# Patient Record
Sex: Male | Born: 1940 | Race: White | Hispanic: No | Marital: Married | Smoking: Former smoker
Health system: Southern US, Community
[De-identification: ages and names within clinical notes are randomized; demographics above are authoritative.]

## PROBLEM LIST (undated history)

## (undated) DIAGNOSIS — K611 Rectal abscess: Secondary | ICD-10-CM

## (undated) DIAGNOSIS — R011 Cardiac murmur, unspecified: Secondary | ICD-10-CM

## (undated) DIAGNOSIS — I4891 Unspecified atrial fibrillation: Secondary | ICD-10-CM

## (undated) DIAGNOSIS — K649 Unspecified hemorrhoids: Secondary | ICD-10-CM

## (undated) DIAGNOSIS — K602 Anal fissure, unspecified: Secondary | ICD-10-CM

## (undated) DIAGNOSIS — I10 Essential (primary) hypertension: Secondary | ICD-10-CM

## (undated) DIAGNOSIS — K635 Polyp of colon: Secondary | ICD-10-CM

## (undated) HISTORY — DX: Cardiac murmur, unspecified: R01.1

## (undated) HISTORY — PX: COLONOSCOPY: SHX174

## (undated) HISTORY — DX: Polyp of colon: K63.5

## (undated) HISTORY — DX: Anal fissure, unspecified: K60.2

## (undated) HISTORY — DX: Rectal abscess: K61.1

## (undated) HISTORY — DX: Essential (primary) hypertension: I10

## (undated) HISTORY — DX: Unspecified hemorrhoids: K64.9

## (undated) HISTORY — PX: RECTAL SURGERY: SHX760

## (undated) HISTORY — PX: FISSURECTOMY: SHX5244

---

## 2003-02-21 HISTORY — PX: KNEE SURGERY: SHX244

## 2005-02-20 HISTORY — PX: POLYPECTOMY: SHX149

## 2006-01-09 ENCOUNTER — Ambulatory Visit: Payer: Self-pay | Admitting: General Surgery

## 2009-09-06 ENCOUNTER — Emergency Department: Payer: Self-pay | Admitting: Emergency Medicine

## 2011-02-07 ENCOUNTER — Ambulatory Visit: Payer: Self-pay | Admitting: General Surgery

## 2011-07-06 ENCOUNTER — Ambulatory Visit: Payer: Self-pay | Admitting: General Surgery

## 2011-07-25 ENCOUNTER — Ambulatory Visit: Payer: Self-pay | Admitting: General Surgery

## 2011-10-22 HISTORY — PX: ANAL FISTULECTOMY: SHX1139

## 2011-10-24 ENCOUNTER — Ambulatory Visit: Payer: Self-pay | Admitting: General Surgery

## 2011-10-31 ENCOUNTER — Ambulatory Visit: Payer: Self-pay | Admitting: General Surgery

## 2012-08-13 ENCOUNTER — Ambulatory Visit (INDEPENDENT_AMBULATORY_CARE_PROVIDER_SITE_OTHER): Payer: Managed Care, Other (non HMO) | Admitting: General Surgery

## 2012-08-13 ENCOUNTER — Encounter: Payer: Self-pay | Admitting: General Surgery

## 2012-08-13 VITALS — BP 130/66 | HR 68 | Resp 14 | Ht 73.0 in | Wt 256.0 lb

## 2012-08-13 DIAGNOSIS — K6289 Other specified diseases of anus and rectum: Secondary | ICD-10-CM | POA: Insufficient documentation

## 2012-08-13 DIAGNOSIS — G8929 Other chronic pain: Secondary | ICD-10-CM

## 2012-08-13 LAB — POC HEMOCCULT BLD/STL (OFFICE/1-CARD/DIAGNOSTIC)

## 2012-08-13 NOTE — Progress Notes (Signed)
Patient ID: Erik Hendricks, male   DOB: 10-21-1940, 72 y.o.   MRN: [id number]  Chief Complaint  Patient presents with  . Rectal Problems    anal fistula    HPI Erik Hendricks is a 72 y.o. male.  Patient here today for rectal evaluation. 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 Wants to have area checked feels like he may have a " cyst" near retcum. Rectal pain for about 2 weeks. He indicated this was at least the size of a marble.  Using nitroglycerin and hydrocortisone from [facility].  Has put some neosporin on the "cyst area". No rectal bleeding. Known history of colon polyps but no colon cancer. No family history of colon cancer. HPI  No past medical history on file.  Past Surgical History  Procedure Laterality Date  . Knee surgery Left 2005  . Anal fistulectomy  2013  . Fissurectomy      40 yrs ago  . Polypectomy  2007  . Colonoscopy  2007, 2012    Dr Lemar Livings    Family History  Problem Relation Age of Onset  . Heart Problems Father     Social History History  Substance Use Topics  . Smoking status: Never Smoker   . Smokeless tobacco: Not on file  . Alcohol Use: No    Allergies not on file  No current outpatient prescriptions on file.   No current facility-administered medications for this visit.    Review of Systems Review of Systems  Constitutional: Negative.   Respiratory: Negative.   Cardiovascular: Negative.   Gastrointestinal: Positive for rectal pain. Negative for anal bleeding.    Height 6\' 1"  (1.854 m), weight 256 lb (116.121 kg).  Physical Exam Physical Exam  Constitutional: He is oriented to person, place, and time. He appears well-developed and well-nourished.  Cardiovascular: Normal rate, regular rhythm and normal heart sounds.   Pulmonary/Chest: Breath sounds normal.  Abdominal: Soft. Bowel sounds are normal. There is no hepatomegaly. There is no tenderness.  Genitourinary: Rectal exam shows internal hemorrhoid.  Ridge  of skin posterior  3 cm skin tag 2 cm from anus   Lymphadenopathy:    He has no cervical adenopathy.  Neurological: He is alert and oriented to person, place, and time.  Skin: Skin is warm and dry.    Data Reviewed Anoscopy showed some mildly prominent internal hemorrhoids without evidence of bleeding. The small amount of visualized rectal mucosa was unremarkable. No evidence of fissures.  External anal exam failed to show any discernible fistula tract. The original site treated in September 2013 is well-healed.    Assessment    No clear etiology for recurrent/persistent rectal pain status post posterior    Plan    The patient has been asked to discontinue both the hydrocortisone and nitroglycerin creams, and instead make use of nonmedicated Preparation H cream nightly. If he remains asymptomatic with this regimen, no further followup will be requested. If he did develop some recurrent pain and swelling as he described being present 2-3 weeks ago he'll be encouraged to call for early assessment while he is symptomatic       Dorathy Daft M 08/13/2012, 10:40 AM

## 2012-08-13 NOTE — Patient Instructions (Signed)
Patient to use Preparation H Cream daily

## 2012-08-27 ENCOUNTER — Ambulatory Visit: Payer: Self-pay | Admitting: General Surgery

## 2012-09-25 ENCOUNTER — Other Ambulatory Visit: Payer: Self-pay

## 2012-11-26 ENCOUNTER — Other Ambulatory Visit: Payer: Self-pay | Admitting: *Deleted

## 2012-11-26 MED ORDER — GABAPENTIN 600 MG PO TABS: 600.0000 mg | ORAL_TABLET | Freq: Three times a day (TID) | ORAL | Status: DC

## 2012-11-26 NOTE — Telephone Encounter (Signed)
Receive fax refill request , approved by dr Charlsie Merles

## 2012-12-26 ENCOUNTER — Other Ambulatory Visit: Payer: Self-pay

## 2012-12-31 ENCOUNTER — Telehealth: Payer: Self-pay | Admitting: *Deleted

## 2012-12-31 NOTE — Telephone Encounter (Signed)
Spoke with Erik Hendricks at pharmacy - [facility] told him only rx for neurontin filled should be for the 600mg  tid per dr Charlsie Merles. The neurontin 400mg  should not be filled per dr Charlsie Merles.

## 2013-01-07 ENCOUNTER — Telehealth: Payer: Self-pay | Admitting: *Deleted

## 2013-01-07 NOTE — Telephone Encounter (Signed)
meghan called from [facility] stating pt is requesting gabapentin 400mg . Per dr Charlsie Merles call in gabapentin 400mg  #30 with 5 refills. meghan stated she will fill and also put note in that he is to get this rx along with the 600mg  gabapentin.

## 2013-01-07 NOTE — Telephone Encounter (Signed)
Spoke with pt letting him know that gabapentin 400mg  had been called in to [facility].

## 2013-05-29 DIAGNOSIS — K529 Noninfective gastroenteritis and colitis, unspecified: Secondary | ICD-10-CM | POA: Insufficient documentation

## 2013-05-29 DIAGNOSIS — I10 Essential (primary) hypertension: Secondary | ICD-10-CM | POA: Insufficient documentation

## 2013-05-29 DIAGNOSIS — E785 Hyperlipidemia, unspecified: Secondary | ICD-10-CM | POA: Insufficient documentation

## 2013-05-29 DIAGNOSIS — H53121 Transient visual loss, right eye: Secondary | ICD-10-CM | POA: Insufficient documentation

## 2013-05-29 DIAGNOSIS — G459 Transient cerebral ischemic attack, unspecified: Secondary | ICD-10-CM | POA: Insufficient documentation

## 2013-05-29 DIAGNOSIS — I639 Cerebral infarction, unspecified: Secondary | ICD-10-CM | POA: Insufficient documentation

## 2013-05-29 DIAGNOSIS — I519 Heart disease, unspecified: Secondary | ICD-10-CM | POA: Insufficient documentation

## 2013-05-30 ENCOUNTER — Telehealth: Payer: Self-pay | Admitting: *Deleted

## 2013-05-30 NOTE — Telephone Encounter (Signed)
Pt needed the gabapentin 600mg  one tablet 3 times a day to be filled for a 90 day supply with 3 refills.  Qty 270 3 refills with [facility] [address]

## 2014-02-03 DIAGNOSIS — R0602 Shortness of breath: Secondary | ICD-10-CM | POA: Insufficient documentation

## 2014-03-27 ENCOUNTER — Encounter: Payer: Self-pay | Admitting: Podiatry

## 2014-03-27 ENCOUNTER — Ambulatory Visit (INDEPENDENT_AMBULATORY_CARE_PROVIDER_SITE_OTHER): Payer: Managed Care, Other (non HMO) | Admitting: Podiatry

## 2014-03-27 DIAGNOSIS — L03031 Cellulitis of right toe: Secondary | ICD-10-CM

## 2014-03-27 NOTE — Patient Instructions (Signed)

## 2014-03-29 NOTE — Progress Notes (Signed)
Subjective:     Patient ID: Erik Hendricks, male   DOB: Jan 02, 1941, 74 y.o.   MRN: [id number]  HPI patient states my right big toe on the lateral side has been painful with some drainage and also I still get a funny feeling in the bottom of both my feet and it doesn't seem the gabapentin is helping   Review of Systems  All other systems reviewed and are negative.      Objective:   Physical Exam  Constitutional: He is oriented to person, place, and time.  Cardiovascular: Intact distal pulses.   Musculoskeletal: Normal range of motion.  Neurological: He is oriented to person, place, and time.  Skin: Skin is warm and dry.  Nursing note and vitals reviewed.  neurovascular status has not changed with muscle strength adequate. Lateral border right hallux is incurvated with distal redness and drainage that is localized. 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 Today I infiltrated the right hallux 60 mg Xylocaine Marcaine mixture remove the lateral border removed proud flesh and distal abscess tissue and allowed channel for drainage. Applied sterile dressing and gave instructions on soaks and reappoint

## 2014-05-20 ENCOUNTER — Other Ambulatory Visit: Payer: Self-pay | Admitting: Podiatry

## 2014-06-09 NOTE — Op Note (Signed)
PATIENT NAME:  Erik Hendricks, Erik Hendricks MR#:  [id number] DATE OF BIRTH:  08/11/1940  DATE OF PROCEDURE:  10/31/2011  PREOPERATIVE DIAGNOSIS: Fistula in ano.   POSTOPERATIVE DIAGNOSIS: Fistula in ano.   OPERATIVE PROCEDURE: Anal fistulotomy.   SURGEON: Hervey Ard, MD   ANESTHESIA: General by LMA, Marcaine 0.5% with 1: 200,000 units of epinephrine, 30 mL local infiltration.   ESTIMATED BLOOD LOSS: Minimal.   CLINICAL NOTE: This 74 year old male had a perirectal abscess earlier in the year. He has developed a fistula in ano. He was admitted for elective repair. The patient received Invanz prior to the procedure.   OPERATIVE NOTE: With the patient under adequate general anesthesia he was placed in the dorsal lithotomy position and the perineum prepped with Betadine solution and draped. The fistula was identified. An anoscope was placed. There was a straight fistula tract from the six o'clock position to the dentate line. This involved the superficial sphincter only. A lacrimal duct probe was placed in the area opened with electrocautery. A curette was used to remove granulation tissue. Bleeding was controlled with electrocautery. A Vaseline gauze pack was placed for later removal in the recovery room. The patient tolerated the procedure well and was taken to the recovery room in stable condition.    ____________________________ Robert Bellow, MD jwb:bjt D:  10/31/2011 22:10:20 ET           T:  11/01/2011 08:53:18 ET         JOB#: [id number]  cc: Robert Bellow, MD, <Dictator> Irven Easterly. Kary Kos, MD Lollie Sails, MD Terrence Pizana Amedeo Kinsman MD ELECTRONICALLY SIGNED 11/02/2011 15:00

## 2014-08-05 DIAGNOSIS — Z01810 Encounter for preprocedural cardiovascular examination: Secondary | ICD-10-CM | POA: Insufficient documentation

## 2014-08-13 ENCOUNTER — Telehealth: Payer: Self-pay | Admitting: *Deleted

## 2014-08-13 MED ORDER — GABAPENTIN 400 MG PO CAPS: 400.0000 mg | ORAL_CAPSULE | Freq: Every day | ORAL | Status: DC

## 2014-08-13 MED ORDER — GABAPENTIN 400 MG PO CAPS: 600.0000 mg | ORAL_CAPSULE | Freq: Every day | ORAL | Status: DC

## 2014-08-13 NOTE — Telephone Encounter (Signed)
Saw dr Erik Hendricks few months ago , would like a increase in the gabapentin . Please advise

## 2014-08-13 NOTE — Telephone Encounter (Signed)
Pt currently taking Gabapentin 600mg  tid and would like to increase his dosage.

## 2014-08-13 NOTE — Telephone Encounter (Signed)
Called pt-per Dr. Paulla Dolly advised to take 400 mg with the last 600 mg dose at night and will refill the gabapentin 400 mg

## 2014-09-03 DIAGNOSIS — H269 Unspecified cataract: Secondary | ICD-10-CM | POA: Insufficient documentation

## 2014-10-19 ENCOUNTER — Telehealth: Payer: Self-pay | Admitting: *Deleted

## 2014-10-19 NOTE — Telephone Encounter (Signed)
Faxed request for 90 day supply of Gabapentin 400mg  2 tablets daily, Dr. Paulla Dolly okayed 90 day supply with +1 refill.  Faxed.

## 2015-10-18 ENCOUNTER — Encounter: Payer: Self-pay | Admitting: Urology

## 2015-10-18 ENCOUNTER — Ambulatory Visit (INDEPENDENT_AMBULATORY_CARE_PROVIDER_SITE_OTHER): Payer: Managed Care, Other (non HMO) | Admitting: Urology

## 2015-10-18 VITALS — BP 120/83 | HR 80 | Ht 73.0 in | Wt 260.3 lb

## 2015-10-18 DIAGNOSIS — I5189 Other ill-defined heart diseases: Secondary | ICD-10-CM | POA: Insufficient documentation

## 2015-10-18 DIAGNOSIS — K589 Irritable bowel syndrome without diarrhea: Secondary | ICD-10-CM | POA: Insufficient documentation

## 2015-10-18 DIAGNOSIS — R351 Nocturia: Secondary | ICD-10-CM

## 2015-10-18 DIAGNOSIS — I10 Essential (primary) hypertension: Secondary | ICD-10-CM | POA: Insufficient documentation

## 2015-10-18 DIAGNOSIS — I2510 Atherosclerotic heart disease of native coronary artery without angina pectoris: Secondary | ICD-10-CM | POA: Insufficient documentation

## 2015-10-18 DIAGNOSIS — I519 Heart disease, unspecified: Secondary | ICD-10-CM | POA: Insufficient documentation

## 2015-10-18 LAB — MICROSCOPIC EXAMINATION
Bacteria, UA: NONE SEEN
Epithelial Cells (non renal): NONE SEEN /hpf (ref 0–10)

## 2015-10-18 LAB — URINALYSIS, COMPLETE
Bilirubin, UA: NEGATIVE
GLUCOSE, UA: NEGATIVE
KETONES UA: NEGATIVE
LEUKOCYTES UA: NEGATIVE
Nitrite, UA: NEGATIVE
Protein, UA: NEGATIVE
RBC, UA: NEGATIVE
SPEC GRAV UA: 1.015 (ref 1.005–1.030)
Urobilinogen, Ur: 0.2 mg/dL (ref 0.2–1.0)
pH, UA: 5 (ref 5.0–7.5)

## 2015-10-18 LAB — BLADDER SCAN AMB NON-IMAGING: SCAN RESULT: 48

## 2015-10-18 NOTE — Progress Notes (Signed)
10/18/2015 11:47 AM   Erik Hendricks January 15, 1941 [id number]  Referring provider: Maryland Pink, MD [address] [facility] [place], [place] [address]  Chief Complaint  Patient presents with  . New Patient (Initial Visit)    Nocturia    HPI: 75 year old male who presents today with complaints of nocturia. The patient gets up on average 4x/nights.  This is been occurring for the last 3 years. However, the patient states that this is progressive over the last several years. The patient otherwise states that he has increased urinary frequency and mild urgency. He denies incomplete bladder emptying, intermittency, weak stream, or straining to void. The patient denies history of constipation. The patient has no history of recurrent urinary tract infections or urinary retention. He is not taking any medications for his symptoms currently.  The patient does say that he has severe obstructive sleep apnea/snores at night. His wife sleeps on the other into the house. He relates that he often wakes up and then realizes that he needs to void, and about half the time he awakens because he has to void. In other words, the patient does not sleep well. He denies any peripheral lower extremity edema. The patient states that he drinks several large glass of water at dinner which is at 5 PM. Following this, he has several sips of fluid but does not drink a significant amount with each step. He generally goes to bed around 11:30.   PMH: Past Medical History:  Diagnosis Date  . Anal fissure   . Colon polyp   . Hemorrhoid   . Hypertension   . Murmur   . Rectal abscess     Surgical History: Past Surgical History:  Procedure Laterality Date  . ANAL FISTULECTOMY  September 2013   Posterior subcutaneous fistula treated by fistulotomy.  . COLONOSCOPY  2007, 2012   Dr Bary Castilla  . FISSURECTOMY     40 yrs ago  . KNEE SURGERY Left 2005  . POLYPECTOMY  2007  . RECTAL SURGERY  40 yrs ago   cyst      Home Medications:    Medication List       Accurate as of 10/18/15 11:47 AM. Always use your most recent med list.          ASACOL HD 800 MG Tbec Generic drug:  Mesalamine Take 1 tablet by mouth daily.   aspirin 81 MG tablet Take 81 mg by mouth daily.   DIOVAN 80 MG tablet Generic drug:  valsartan 80 mg daily.   hydrocortisone 25 MG suppository Commonly known as:  ANUSOL-HC   Krill Oil 350 MG Caps Take by mouth.   metoprolol succinate 50 MG 24 hr tablet Commonly known as:  TOPROL-XL Take 1 tablet by mouth daily.   NIASPAN 500 MG CR tablet Generic drug:  niacin Take 1 tablet by mouth 3 (three) times daily.   pregabalin 50 MG capsule Commonly known as:  LYRICA Take 50 mg by mouth 3 (three) times daily.   RECTIV 0.4 % Oint Generic drug:  Nitroglycerin   simvastatin 20 MG tablet Commonly known as:  ZOCOR Take 1 tablet by mouth daily.       Allergies: No Known Allergies  Family History: Family History  Problem Relation Age of Onset  . Heart Problems Father   . Prostate cancer Neg Hx   . Bladder Cancer Neg Hx     Social History:  reports that he has never smoked. He has never used  smokeless tobacco. He reports that he does not drink alcohol or use drugs.  ROS: UROLOGY Frequent Urination?: Yes Hard to postpone urination?: Yes Burning/pain with urination?: No Get up at night to urinate?: Yes Leakage of urine?: No Urine stream starts and stops?: No Trouble starting stream?: No Do you have to strain to urinate?: No Blood in urine?: No Urinary tract infection?: No Sexually transmitted disease?: No Injury to kidneys or bladder?: No Painful intercourse?: No Weak stream?: No Erection problems?: No Penile pain?: No  Gastrointestinal Nausea?: No Vomiting?: No Indigestion/heartburn?: No Diarrhea?: No Constipation?: No  Constitutional Fever: No Night sweats?: No Weight loss?: No Fatigue?: No  Skin Skin rash/lesions?: Yes Itching?:  Yes  Eyes Blurred vision?: No Double vision?: No  Ears/Nose/Throat Sore throat?: No Sinus problems?: No  Hematologic/Lymphatic Swollen glands?: No Easy bruising?: No  Cardiovascular Leg swelling?: No Chest pain?: No  Respiratory Cough?: No Shortness of breath?: No  Endocrine Excessive thirst?: No  Musculoskeletal Back pain?: No Joint pain?: No  Neurological Headaches?: No Dizziness?: No  Psychologic Depression?: No Anxiety?: No  Physical Exam: BP 120/83 (BP Location: Left Arm, Patient Position: Sitting, Cuff Size: Large)   Pulse 80   Ht 6\' 1"  (1.854 m)   Wt 118.1 kg (260 lb 4.8 oz)   BMI 34.34 kg/m   Constitutional:  Alert and oriented, No acute distress. HEENT: [place] AT, moist mucus membranes.  Trachea midline, no masses. Cardiovascular: No clubbing, cyanosis, or edema. Respiratory: Normal respiratory effort, no increased work of breathing. GI: Abdomen is soft, nontender, nondistended, no abdominal masses GU: No CVA tenderness.  DRE: 40 g prostate, symmetric, no nodules Skin: No rashes, bruises or suspicious lesions. Lymph: No cervical or inguinal adenopathy. Neurologic: Grossly intact, no focal deficits, moving all 4 extremities. Psychiatric: Normal mood and affect.  Laboratory Data: No results found for: WBC, HGB, HCT, MCV, PLT  No results found for: CREATININE  No results found for: PSA  No results found for: TESTOSTERONE  No results found for: HGBA1C  Urinalysis No results found for: COLORURINE, APPEARANCEUR, LABSPEC, PHURINE, GLUCOSEU, HGBUR, BILIRUBINUR, KETONESUR, PROTEINUR, UROBILINOGEN, NITRITE, LEUKOCYTESUR  Pertinent Imaging: PVR: 46 mL's Patient's urinalysis today to adjacent evidence of infection or hematuria.  Assessment & Plan:  The patient has nocturia with mild overactive bladder symptoms. He does not appear to have an obstructing. The biggest driver in his nocturia is his obstructive sleep apnea.  1. Nocturia I recommended  the patient consider a sleep study so that he could be placed on a sleep apnea machine. The patient has adamantly refuses. We then discussed the mouth piece as well as the Breathe Right strips. The patient's most cinched and looking into the mouth piece, he will look into this on his own. I think a lot of his issues also that he is not sleeping well and waking up and then voiding. We discussed a sleep aid as well. Initially, I recommended that he take melatonin 5 mg daily at bedtime. In addition, given the patient samples of myrbetriq 50 mg daily. He will contact us if he would like a prescription for this. We'll plan to follow up with the patient in 6 months to see how he has progressed with his nocturia given the above interventions.  Meter tomorrow  PVR- Bladder Scan (Post Void Residual) in office - Urinalysis, Complete   Return in about 6 months (around 04/19/2016).  Ardis Hughs, [facility] [address] [place], [place] [address] [phone]  r;

## 2016-04-20 ENCOUNTER — Encounter: Payer: Self-pay | Admitting: Urology

## 2016-04-20 ENCOUNTER — Ambulatory Visit: Payer: 59 | Admitting: Urology

## 2016-04-20 VITALS — BP 159/74 | HR 73 | Ht 72.0 in | Wt 268.0 lb

## 2016-04-20 DIAGNOSIS — R351 Nocturia: Secondary | ICD-10-CM | POA: Diagnosis not present

## 2016-04-20 NOTE — Progress Notes (Signed)
04/20/2016 9:02 AM   Erik Hendricks 05-21-1940 [id number]  Referring provider: Maryland Pink, MD [address] [facility] [place], [place] [address]  Chief Complaint  Patient presents with  . Nocturia    71month follow up    HPI: 76 year old male who presents today with complaints of nocturia. The patient gets up on average 4x/nights.  This is been occurring for the last 3 years. However, the patient states that this is progressive over the last several years. The patient otherwise states that he has increased urinary frequency and mild urgency. He denies incomplete bladder emptying, intermittency, weak stream, or straining to void. The patient denies history of constipation. The patient has no history of recurrent urinary tract infections or urinary retention. He is not taking any medications for his symptoms currently.  The patient does say that he has severe obstructive sleep apnea/snores at night. His wife sleeps on the other into the house. He relates that he often wakes up and then realizes that he needs to void, and about half the time he awakens because he has to void. In other words, the patient does not sleep well. He denies any peripheral lower extremity edema. The patient states that he drinks several large glass of water at dinner which is at 5 PM. Following this, he has several sips of fluid but does not drink a significant amount with each step. He generally goes to bed around 11:30.    At his last visit, the patient was recommended to undergo a sleep study as this was thought to be the main driver of his nocturia. He was started on Myrbetriq 50 mg daily but is no longer taking it. She does not remember if it worked for him or not. He is not interested in undergoing a sleep study. He notes that this time if he takes a Benadryl he is able to sleep through most the night with nocturia 1. He does not take Benadryl he awakes 3 times to urinate. He is a poor historian this  time.   PMH: Past Medical History:  Diagnosis Date  . Anal fissure   . Colon polyp   . Hemorrhoid   . Hypertension   . Murmur   . Rectal abscess     Surgical History: Past Surgical History:  Procedure Laterality Date  . ANAL FISTULECTOMY  September 2013   Posterior subcutaneous fistula treated by fistulotomy.  . COLONOSCOPY  2007, 2012   Dr Bary Castilla  . FISSURECTOMY     40 yrs ago  . KNEE SURGERY Left 2005  . POLYPECTOMY  2007  . RECTAL SURGERY  40 yrs ago   cyst    Home Medications:  Allergies as of 04/20/2016   No Known Allergies     Medication List       Accurate as of 04/20/16  9:02 AM. Always use your most recent med list.          ASACOL HD 800 MG Tbec Generic drug:  Mesalamine Take 1 tablet by mouth daily.   aspirin 81 MG tablet Take 81 mg by mouth daily.   DIOVAN 80 MG tablet Generic drug:  valsartan 80 mg daily.   hydrocortisone 25 MG suppository Commonly known as:  ANUSOL-HC   Krill Oil 350 MG Caps Take by mouth.   metoprolol succinate 50 MG 24 hr tablet Commonly known as:  TOPROL-XL Take 1 tablet by mouth daily.   NIASPAN 500 MG CR tablet Generic drug:  niacin Take  1 tablet by mouth 3 (three) times daily.   pregabalin 50 MG capsule Commonly known as:  LYRICA Take 50 mg by mouth 3 (three) times daily.   RECTIV 0.4 % Oint Generic drug:  Nitroglycerin   simvastatin 20 MG tablet Commonly known as:  ZOCOR Take 1 tablet by mouth daily.       Allergies: No Known Allergies  Family History: Family History  Problem Relation Age of Onset  . Heart Problems Father   . Prostate cancer Neg Hx   . Bladder Cancer Neg Hx     Social History:  reports that he has quit smoking. His smoking use included Cigars. He has never used smokeless tobacco. He reports that he does not drink alcohol or use drugs.  ROS: UROLOGY Frequent Urination?: Yes Hard to postpone urination?: No Burning/pain with urination?: No Get up at night to urinate?:  Yes Leakage of urine?: No Urine stream starts and stops?: No Trouble starting stream?: No Do you have to strain to urinate?: No Blood in urine?: No Urinary tract infection?: No Sexually transmitted disease?: No Injury to kidneys or bladder?: No Painful intercourse?: No Weak stream?: No Erection problems?: No Penile pain?: No  Gastrointestinal Nausea?: No Vomiting?: No Indigestion/heartburn?: No Diarrhea?: No Constipation?: No  Constitutional Fever: No Night sweats?: No Weight loss?: No Fatigue?: No  Skin Skin rash/lesions?: No Itching?: No  Eyes Blurred vision?: No Double vision?: No  Ears/Nose/Throat Sore throat?: No Sinus problems?: No  Hematologic/Lymphatic Swollen glands?: No Easy bruising?: No  Cardiovascular Leg swelling?: No Chest pain?: No  Respiratory Cough?: No Shortness of breath?: No  Endocrine Excessive thirst?: No  Musculoskeletal Back pain?: No Joint pain?: No  Neurological Headaches?: No Dizziness?: No  Psychologic Depression?: No Anxiety?: No  Physical Exam: BP (!) 159/74   Pulse 73   Ht 6' (1.829 m)   Wt 268 lb (121.6 kg)   BMI 36.35 kg/m   Constitutional:  Alert and oriented, No acute distress. HEENT: [place] AT, moist mucus membranes.  Trachea midline, no masses. Cardiovascular: No clubbing, cyanosis, or edema. Respiratory: Normal respiratory effort, no increased work of breathing. GI: Abdomen is soft, nontender, nondistended, no abdominal masses GU: No CVA tenderness.  Skin: No rashes, bruises or suspicious lesions. Lymph: No cervical or inguinal adenopathy. Neurologic: Grossly intact, no focal deficits, moving all 4 extremities. Psychiatric: Normal mood and affect.  Laboratory Data: No results found for: WBC, HGB, HCT, MCV, PLT  No results found for: CREATININE  No results found for: PSA  No results found for: TESTOSTERONE  No results found for: HGBA1C  Urinalysis    Component Value Date/Time    APPEARANCEUR Clear 10/18/2015 1110   GLUCOSEU Negative 10/18/2015 1110   BILIRUBINUR Negative 10/18/2015 1110   PROTEINUR Negative 10/18/2015 1110   NITRITE Negative 10/18/2015 1110   LEUKOCYTESUR Negative 10/18/2015 1110     Assessment & Plan:    1. Nocturia The patient's nocturia is likely from undiagnosed obstructive sleep apnea. I again encouraged him to discuss undergoing a sleep study with his primary care provider. We discussed the health risks of obstructive sleep apnea as well as how relates nocturia. At this time though he does seem uninterested undergoing this test. Since he does take Benadryl and is able to sleep through the night with minimal nocturia, I recommended that he continue with this. He will follow-up with [place] as needed.  Return if symptoms worsen or fail to improve.  Nickie Retort, MD  [facility] [phone]  [address] [place], [place] [address] [phone]

## 2018-03-14 ENCOUNTER — Other Ambulatory Visit: Payer: Self-pay | Admitting: Student

## 2018-03-14 DIAGNOSIS — G9519 Other vascular myelopathies: Secondary | ICD-10-CM

## 2018-03-14 DIAGNOSIS — M48062 Spinal stenosis, lumbar region with neurogenic claudication: Secondary | ICD-10-CM

## 2018-03-26 ENCOUNTER — Other Ambulatory Visit: Payer: Self-pay | Admitting: Neurosurgery

## 2018-03-26 ENCOUNTER — Ambulatory Visit: Admission: RE | Admit: 2018-03-26 | Payer: 59 | Source: Ambulatory Visit

## 2018-03-26 DIAGNOSIS — R29898 Other symptoms and signs involving the musculoskeletal system: Secondary | ICD-10-CM

## 2018-03-27 ENCOUNTER — Ambulatory Visit
Admission: RE | Admit: 2018-03-27 | Discharge: 2018-03-27 | Disposition: A | Discharge status: Home / Self Care | Payer: 59 | Source: Ambulatory Visit | Attending: Neurosurgery | Admitting: Neurosurgery

## 2018-03-27 DIAGNOSIS — R29898 Other symptoms and signs involving the musculoskeletal system: Secondary | ICD-10-CM | POA: Diagnosis present

## 2018-04-18 ENCOUNTER — Ambulatory Visit (INDEPENDENT_AMBULATORY_CARE_PROVIDER_SITE_OTHER): Payer: 59

## 2018-04-18 ENCOUNTER — Ambulatory Visit (INDEPENDENT_AMBULATORY_CARE_PROVIDER_SITE_OTHER): Payer: 59 | Admitting: Podiatry

## 2018-04-18 ENCOUNTER — Encounter: Payer: Self-pay | Admitting: Podiatry

## 2018-04-18 VITALS — BP 150/90 | HR 74

## 2018-04-18 DIAGNOSIS — R200 Anesthesia of skin: Secondary | ICD-10-CM

## 2018-04-18 DIAGNOSIS — M205X1 Other deformities of toe(s) (acquired), right foot: Secondary | ICD-10-CM

## 2018-04-18 DIAGNOSIS — R609 Edema, unspecified: Secondary | ICD-10-CM | POA: Diagnosis not present

## 2018-04-18 MED ORDER — MELOXICAM 7.5 MG PO TABS: 7.5000 mg | ORAL_TABLET | Freq: Every day | ORAL | 1 refills | Status: DC

## 2018-04-18 NOTE — Progress Notes (Signed)
Patient presents the office with chief complaint of numbness and tingling and burning on the bottom of his right foot.  Patient states that the pain starts under the big toe joint and moves outward.  He says by the end of the day there is swelling in his right foot.  He says this is been going on for a long time and now his left foot is starting to have similar symptoms.  He has a history of back problems that he was treated by the neurosurgeon.  He also has been on gabapentin and Lyrica for neuropathy.  He also relates having been scheduled for a vascular study due to pain in her in his upper leg right foot.  He denies any history of trauma or injury to the foot.  He has provided no self treatment or sought any professional help for his symptomatic  right foot.  Vascular  Dorsalis pedis and posterior tibial pulses are palpable  B/L.  Capillary return  WNL.  Temperature gradient is  WNL.  Skin turgor  WNL  Sensorium  Senn Weinstein monofilament wire  WNL. Normal tactile sensation.  Nail Exam  Patient has normal nails with no evidence of bacterial or fungal infection.  Orthopedic  Exam  Muscle tone and muscle strength  WNL.  No limitations of motion feet  B/L.  No crepitus or joint effusion noted.  Foot type is unremarkable and digits show no abnormalities. Limitation of motion 1st MPJ right foot.  Plantarflexed first met right foot.    Skin  No open lesions.  Normal skin texture and turgor.  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    IE.  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  Bone reactivity noted at the dorsum of the first metatarsal right foot.  Narrowing of the joint space first MPJ right foot.  Discussed this condition with this patient.  Told him that the numbness can be related to the lack of motion during gait in the big toe joint right foot.  He is scheduled to have vascular studies performed tomorrow to rule out any vascular pathology.   Patient was told he would benefit from an evaluation by Dr. Milinda Pointer for possible removal of the bony block right big toe.  In the interim patient was prescribed Mobic 7.5 mg #30 with 1 refill.     Gardiner Barefoot DPM

## 2018-05-01 ENCOUNTER — Other Ambulatory Visit: Payer: Self-pay | Admitting: Unknown Physician Specialty

## 2018-05-01 DIAGNOSIS — D110 Benign neoplasm of parotid gland: Principal | ICD-10-CM

## 2018-05-01 DIAGNOSIS — K118 Other diseases of salivary glands: Secondary | ICD-10-CM

## 2018-05-06 ENCOUNTER — Encounter: Payer: Self-pay | Admitting: Podiatry

## 2018-05-06 ENCOUNTER — Other Ambulatory Visit: Payer: Self-pay

## 2018-05-06 ENCOUNTER — Ambulatory Visit (INDEPENDENT_AMBULATORY_CARE_PROVIDER_SITE_OTHER): Payer: 59 | Admitting: Podiatry

## 2018-05-06 DIAGNOSIS — M205X1 Other deformities of toe(s) (acquired), right foot: Secondary | ICD-10-CM

## 2018-05-06 NOTE — Progress Notes (Signed)
He presents today with a chief complaint of numbness in his bilateral feet.  He has been told by his neurosurgeon the most likely was associated with his spinal stenosis and he will be following up with a neurologist in the next few days for confirmation.  He states that as long as he takes his Lyrica he does really well otherwise his feet are painful.  He states that currently with Lyrica they are just numb but not painful.  Objective: Vital signs are stable he is alert oriented x3.  Pulses are palpable.  He has decreased sensorium per Semmes Weinstein monofilament forefoot bilateral.  DP reflexes are intact muscle strength is normal symmetrical.  No open lesions or wounds.  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  Plan: Continue Lyrica he is on follow-up with neurology who hopefully will perform a nerve conduction test and EMG and provide him with a better explanation as to why he hurts.  Should he be referred to pain management or referred back to [place] then more than likely we will send him to Dr. Clydell Hakim at [facility] neurosurgery.

## 2018-05-09 ENCOUNTER — Other Ambulatory Visit: Payer: Self-pay

## 2018-05-09 ENCOUNTER — Ambulatory Visit
Admission: RE | Admit: 2018-05-09 | Discharge: 2018-05-09 | Disposition: A | Discharge status: Home / Self Care | Payer: 59 | Source: Ambulatory Visit | Attending: Unknown Physician Specialty | Admitting: Unknown Physician Specialty

## 2018-05-09 DIAGNOSIS — K118 Other diseases of salivary glands: Secondary | ICD-10-CM

## 2018-05-09 DIAGNOSIS — D110 Benign neoplasm of parotid gland: Secondary | ICD-10-CM | POA: Insufficient documentation

## 2018-05-09 LAB — POCT I-STAT CREATININE: Creatinine, Ser: 0.8 mg/dL (ref 0.61–1.24)

## 2018-05-09 MED ORDER — IOHEXOL 300 MG/ML  SOLN
75.0000 mL | Freq: Once | INTRAMUSCULAR | Status: AC | PRN
  Administered 2018-05-09: 75 mL via INTRAVENOUS

## 2018-05-14 ENCOUNTER — Other Ambulatory Visit: Payer: Self-pay | Admitting: Unknown Physician Specialty

## 2018-05-14 DIAGNOSIS — K118 Other diseases of salivary glands: Secondary | ICD-10-CM

## 2018-05-15 ENCOUNTER — Other Ambulatory Visit: Payer: Self-pay | Admitting: Radiology

## 2018-05-16 ENCOUNTER — Other Ambulatory Visit: Payer: Self-pay

## 2018-05-16 ENCOUNTER — Ambulatory Visit
Admission: RE | Admit: 2018-05-16 | Discharge: 2018-05-16 | Disposition: A | Discharge status: Home / Self Care | Payer: 59 | Source: Ambulatory Visit | Attending: Unknown Physician Specialty | Admitting: Unknown Physician Specialty

## 2018-05-16 DIAGNOSIS — Z7982 Long term (current) use of aspirin: Secondary | ICD-10-CM | POA: Diagnosis not present

## 2018-05-16 DIAGNOSIS — I10 Essential (primary) hypertension: Secondary | ICD-10-CM | POA: Insufficient documentation

## 2018-05-16 DIAGNOSIS — Z7901 Long term (current) use of anticoagulants: Secondary | ICD-10-CM | POA: Diagnosis not present

## 2018-05-16 DIAGNOSIS — I4891 Unspecified atrial fibrillation: Secondary | ICD-10-CM | POA: Diagnosis not present

## 2018-05-16 DIAGNOSIS — Z79899 Other long term (current) drug therapy: Secondary | ICD-10-CM | POA: Diagnosis not present

## 2018-05-16 DIAGNOSIS — R011 Cardiac murmur, unspecified: Secondary | ICD-10-CM | POA: Diagnosis not present

## 2018-05-16 DIAGNOSIS — K118 Other diseases of salivary glands: Secondary | ICD-10-CM | POA: Insufficient documentation

## 2018-05-16 DIAGNOSIS — Z791 Long term (current) use of non-steroidal anti-inflammatories (NSAID): Secondary | ICD-10-CM | POA: Insufficient documentation

## 2018-05-16 DIAGNOSIS — Z87891 Personal history of nicotine dependence: Secondary | ICD-10-CM | POA: Diagnosis not present

## 2018-05-16 HISTORY — DX: Unspecified atrial fibrillation: I48.91

## 2018-05-16 LAB — CBC
HCT: 45.5 % (ref 39.0–52.0)
Hemoglobin: 14.9 g/dL (ref 13.0–17.0)
MCH: 29.9 pg (ref 26.0–34.0)
MCHC: 32.7 g/dL (ref 30.0–36.0)
MCV: 91.2 fL (ref 80.0–100.0)
PLATELETS: 173 10*3/uL (ref 150–400)
RBC: 4.99 MIL/uL (ref 4.22–5.81)
RDW: 13.1 % (ref 11.5–15.5)
WBC: 7.1 10*3/uL (ref 4.0–10.5)
nRBC: 0 % (ref 0.0–0.2)

## 2018-05-16 LAB — PROTIME-INR
INR: 1 (ref 0.8–1.2)
Prothrombin Time: 12.8 seconds (ref 11.4–15.2)

## 2018-05-16 MED ORDER — SODIUM CHLORIDE 0.9 % IV SOLN
INTRAVENOUS | Status: DC
  Administered 2018-05-16: 14:00:00 via INTRAVENOUS

## 2018-05-16 MED ORDER — MIDAZOLAM HCL 5 MG/5ML IJ SOLN
INTRAMUSCULAR | Status: AC
  Filled 2018-05-16: qty 5

## 2018-05-16 MED ORDER — FENTANYL CITRATE (PF) 100 MCG/2ML IJ SOLN
INTRAMUSCULAR | Status: AC
  Filled 2018-05-16: qty 4

## 2018-05-16 MED ORDER — FENTANYL CITRATE (PF) 100 MCG/2ML IJ SOLN
INTRAMUSCULAR | Status: AC | PRN
  Administered 2018-05-16: 50 ug via INTRAVENOUS
  Administered 2018-05-16 (×2): 25 ug via INTRAVENOUS

## 2018-05-16 MED ORDER — MIDAZOLAM HCL 5 MG/5ML IJ SOLN
INTRAMUSCULAR | Status: AC | PRN
  Administered 2018-05-16 (×2): 1 mg via INTRAVENOUS

## 2018-05-16 NOTE — Procedures (Signed)
L parotid mass core Bx 18 g times two EBL 0 Comp 0

## 2018-05-16 NOTE — H&P (Signed)
Chief Complaint: Patient was seen in consultation today for No chief complaint on file.  at the request of Neibert  Referring Physician(s): McQueen,Chapman  Supervising Physician: Marybelle Killings  Patient Status: [facility] - Out-pt  History of Present Illness: Erik Hendricks is a 78 y.o. male with a left parotid mass. Biopsy is requested. He denies fever, chills, SOB, cough.  Past Medical History:  Diagnosis Date  . A-fib ([place])   . Anal fissure   . Colon polyp   . Hemorrhoid   . Hypertension   . Murmur   . Rectal abscess     Past Surgical History:  Procedure Laterality Date  . ANAL FISTULECTOMY  September 2013   Posterior subcutaneous fistula treated by fistulotomy.  . COLONOSCOPY  2007, 2012   Dr Bary Castilla  . FISSURECTOMY     40 yrs ago  . KNEE SURGERY Left 2005  . POLYPECTOMY  2007  . RECTAL SURGERY  40 yrs ago   cyst    Allergies: Ciprofloxacin  Medications: Prior to Admission medications   Medication Sig Start Date End Date Taking? Authorizing Provider  aspirin EC 81 MG tablet Take by mouth daily.    Yes [provider]  balsalazide (COLAZAL) 750 MG capsule Take 750 mg by mouth 2 (two) times daily.   Yes [provider]  fluticasone (FLONASE) 50 MCG/ACT nasal spray Place 1 spray into both nostrils daily.   Yes [provider]  Javier Docker Oil 350 MG CAPS Take 350 mg by mouth 2 (two) times daily.    Yes [provider]  losartan (COZAAR) 50 MG tablet TAKE 1 TABLET BY MOUTH EVERY DAY 03/21/18  Yes [provider]  Mesalamine 800 MG TBEC Take 800 mg by mouth 3 (three) times daily.  11/13/16  Yes [provider]  metoprolol succinate (TOPROL-XL) 50 MG 24 hr tablet Take 50 mg by mouth 2 (two) times daily.    Yes [provider]  niacin (NIASPAN) 500 MG CR tablet Take 500 mg by mouth daily.    Yes [provider]  pregabalin (LYRICA) 150 MG capsule Take 150 mg by mouth 2 (two) times daily. 04/18/18   Yes [provider]  pregabalin (LYRICA) 50 MG capsule Take 150 mg by mouth 2 (two) times daily.    Yes [provider]  simvastatin (ZOCOR) 20 MG tablet Take 20 mg by mouth daily.  02/01/18  Yes [provider]  diclofenac sodium (VOLTAREN) 1 % GEL Apply topically 4 (four) times daily.  02/05/18 02/05/19  [provider]  DULoxetine (CYMBALTA) 30 MG capsule Take by mouth daily. 04/24/18   [provider]  gabapentin (NEURONTIN) 400 MG capsule gabapentin 400 mg capsule    [provider]  meloxicam (MOBIC) 7.5 MG tablet Take 1 tablet (7.5 mg total) by mouth daily. Patient not taking: Reported on 05/16/2018 04/18/18   Gardiner Barefoot, DPM  metoprolol tartrate (LOPRESSOR) 50 MG tablet  05/02/18   [provider]  montelukast (SINGULAIR) 10 MG tablet Take by mouth. 04/03/17   [provider]  triamterene-hydrochlorothiazide (MAXZIDE-25) 37.5-25 MG tablet Take 1 tablet by mouth daily. 04/21/18   [provider]  valsartan (DIOVAN) 80 MG tablet valsartan 80 mg tablet    [provider]     Family History  Problem Relation Age of Onset  . Heart Problems Father   . Prostate cancer Neg Hx   . Bladder Cancer Neg Hx     Social History  Socioeconomic History  . Marital status: Married    Spouse name: Not on file  . Number of children: Not on file  . Years of education: Not on file  . Highest education level: Not on file  Occupational History  . Not on file  Social Needs  . Financial resource strain: Not on file  . Food insecurity:    Worry: Not on file    Inability: Not on file  . Transportation needs:    Medical: Not on file    Non-medical: Not on file  Tobacco Use  . Smoking status: Former Smoker    Types: Cigars  . Smokeless tobacco: Never Used  Substance and Sexual Activity  . Alcohol use: No  . Drug use: No  . Sexual activity: Not on file  Lifestyle  . Physical activity:    Days per week: Not  on file    Minutes per session: Not on file  . Stress: Not on file  Relationships  . Social connections:    Talks on phone: Not on file    Gets together: Not on file    Attends religious service: Not on file    Active member of club or organization: Not on file    Attends meetings of clubs or organizations: Not on file    Relationship status: Not on file  Other Topics Concern  . Not on file  Social History Narrative  . Not on file     Review of Systems: A 12 point ROS discussed and pertinent positives are indicated in the HPI above.  All other systems are negative.  Review of Systems  Vital Signs: BP (!) 143/80   Pulse 70   Temp 97.8 F (36.6 C) (Oral)   Resp 15   Ht 6' (1.829 m)   Wt 116.6 kg   SpO2 96%   BMI 34.86 kg/m   Physical Exam Constitutional:      Appearance: Normal appearance.  HENT:     Head: Normocephalic and atraumatic.  Cardiovascular:     Rate and Rhythm: Normal rate and regular rhythm.  Pulmonary:     Effort: Pulmonary effort is normal.     Breath sounds: Normal breath sounds.  Abdominal:     General: Abdomen is flat.     Palpations: Abdomen is soft.  Skin:    General: Skin is warm and dry.  Neurological:     General: No focal deficit present.     Mental Status: He is alert and oriented to person, place, and time.     Imaging: Ct Soft Tissue Neck W Contrast  Result Date: 05/09/2018 CLINICAL DATA:  Benign mass  parotid gland. EXAM: CT NECK WITH CONTRAST TECHNIQUE: Multidetector CT imaging of the neck was performed using the standard protocol following the bolus administration of intravenous contrast. CONTRAST:  27mL OMNIPAQUE IOHEXOL 300 MG/ML  SOLN COMPARISON:  None. FINDINGS: Pharynx and larynx: Normal. No mass or swelling. Salivary glands: Left parotid mass measuring 16 x 14 mm. Soft tissue density with punctate calcification. Margins of the mass are ill-defined and infiltrate the parotid gland. Mass extends into the subcutaneous tissues  outside of the parotid gland. Right parotid gland normal aside from a small 4 mm lymph node anteriorly. Submandibular glands normal bilaterally without mass or stone Thyroid: 7 mm nodule right lower pole thyroid.  Otherwise negative. Lymph nodes: No pathologic or enlarged lymph nodes. 4 mm lymph nodes are seen on the surface of the parotid gland bilaterally. Vascular: Normal vascular  enhancement. Limited intracranial: Negative Visualized orbits: Negative for mass.  Bilateral cataract surgery. Mastoids and visualized paranasal sinuses: Mild mucosal edema paranasal sinuses. No air-fluid levels. Skeleton: Degenerative change throughout the cervical spine. No acute skeletal abnormality. Upper chest: Negative Other: None IMPRESSION: 16 x 14 mm left parotid mass. The mass has some aggressive features including irregular infiltration of the adjacent parotid gland as well as infiltration of the subcutaneous tissues. 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 No malignant adenopathy in the neck. Electronically Signed   By: Franchot Gallo M.D.   On: 05/09/2018 15:47   Dg Foot Complete Right  Result Date: 04/18/2018 Please see detailed radiograph report in office note.   Labs:  CBC: Recent Labs    05/16/18 1352  WBC 7.1  HGB 14.9  HCT 45.5  PLT 173    COAGS: Recent Labs    05/16/18 1352  INR 1.0    BMP: Recent Labs    05/09/18 1512  CREATININE 0.80    LIVER FUNCTION TESTS: No results for input(s): BILITOT, AST, ALT, ALKPHOS, PROT, ALBUMIN in the last 8760 hours.  TUMOR MARKERS: No results for input(s): AFPTM, CEA, CA199, CHROMGRNA in the last 8760 hours.  Assessment and Plan:  Left parotid mass. US guided biopsy to follow.  Thank you for this interesting consult.  I greatly enjoyed meeting Erik Hendricks and look forward to participating in their care.  A copy of this report was sent to the requesting provider on this date.  Electronically Signed:  Art A Jazir Newey, MD 05/16/2018, 2:27 PM   I spent a total of  40 Minutes   in face to face in clinical consultation, greater than 50% of which was counseling/coordinating care for left parotid biopsy.

## 2018-05-21 ENCOUNTER — Other Ambulatory Visit: Payer: Self-pay | Admitting: Pathology

## 2018-05-21 LAB — SURGICAL PATHOLOGY

## 2018-05-27 ENCOUNTER — Other Ambulatory Visit: Payer: Self-pay | Admitting: Unknown Physician Specialty

## 2018-05-27 ENCOUNTER — Other Ambulatory Visit (HOSPITAL_COMMUNITY): Payer: Self-pay | Admitting: Unknown Physician Specialty

## 2018-05-27 DIAGNOSIS — C07 Malignant neoplasm of parotid gland: Secondary | ICD-10-CM

## 2018-05-30 ENCOUNTER — Other Ambulatory Visit: Payer: Medicare Other

## 2018-05-30 NOTE — Progress Notes (Signed)
Tumor Board Documentation  Erik Hendricks was presented by Radiology at our Tumor Board on 05/30/2018, which included representatives from medical oncology, radiation oncology, surgical oncology, surgical, radiology, pathology, navigation, internal medicine, genetics, research, palliative care.  Erik Hendricks currently presents for [facility], for discussion, for new positive pathology with history of the following treatments: surgical intervention(s).  Additionally, we reviewed previous medical and familial history, history of present illness, and recent lab results along with all available histopathologic and imaging studies. The tumor board considered available treatment options and made the following recommendations: Surgery Surgical resection and Possible Radiation therapy  The following procedures/referrals were also placed: No orders of the defined types were placed in this encounter.   Clinical Trial Status: not discussed   Staging used: AJCC Stage Group  AJCC Staging: T: T4a N: N0   Group: Stage 4 A  National site-specific guidelines NCCN were discussed with respect to the case.  Tumor board is a meeting of clinicians from various specialty areas who evaluate and discuss patients for whom a multidisciplinary approach is being considered. Final determinations in the plan of care are those of the provider(s). The responsibility for follow up of recommendations given during tumor board is that of the provider.   Today's extended care, comprehensive team conference, Erik Hendricks was not present for the discussion and was not examined.   Multidisciplinary Tumor Board is a multidisciplinary case peer review process.  Decisions discussed in the Multidisciplinary Tumor Board reflect the opinions of the specialists present at the conference without having examined the patient.  Ultimately, treatment and diagnostic decisions rest with the primary provider(s) and the patient.

## 2018-06-04 ENCOUNTER — Other Ambulatory Visit: Payer: Self-pay

## 2018-06-04 ENCOUNTER — Ambulatory Visit
Admission: RE | Admit: 2018-06-04 | Discharge: 2018-06-04 | Disposition: A | Discharge status: Home / Self Care | Payer: 59 | Source: Ambulatory Visit | Attending: Unknown Physician Specialty | Admitting: Unknown Physician Specialty

## 2018-06-04 DIAGNOSIS — N400 Enlarged prostate without lower urinary tract symptoms: Secondary | ICD-10-CM | POA: Diagnosis not present

## 2018-06-04 DIAGNOSIS — I700 Atherosclerosis of aorta: Secondary | ICD-10-CM | POA: Diagnosis not present

## 2018-06-04 DIAGNOSIS — C07 Malignant neoplasm of parotid gland: Secondary | ICD-10-CM | POA: Diagnosis not present

## 2018-06-04 LAB — GLUCOSE, CAPILLARY: Glucose-Capillary: 134 mg/dL — ABNORMAL HIGH (ref 70–99)

## 2018-06-04 MED ORDER — FLUDEOXYGLUCOSE F - 18 (FDG) INJECTION
13.6500 | Freq: Once | INTRAVENOUS | Status: AC | PRN
  Administered 2018-06-04: 13.65 via INTRAVENOUS

## 2018-06-06 DIAGNOSIS — C089 Malignant neoplasm of major salivary gland, unspecified: Secondary | ICD-10-CM | POA: Diagnosis present

## 2018-06-12 ENCOUNTER — Ambulatory Visit: Payer: Medicare Other

## 2018-06-20 ENCOUNTER — Ambulatory Visit: Payer: Medicare Other

## 2018-06-20 ENCOUNTER — Encounter: Payer: Self-pay | Admitting: Anesthesiology

## 2018-06-21 ENCOUNTER — Other Ambulatory Visit: Payer: Self-pay

## 2018-06-21 ENCOUNTER — Ambulatory Visit
Admission: RE | Admit: 2018-06-21 | Discharge: 2018-06-21 | Disposition: A | Discharge status: Home / Self Care | Payer: 59 | Attending: Gastroenterology | Admitting: Gastroenterology

## 2018-06-21 ENCOUNTER — Ambulatory Visit: Payer: 59 | Admitting: Anesthesiology

## 2018-06-21 ENCOUNTER — Encounter
Admission: RE | Disposition: A | Discharge status: Home / Self Care | Payer: Self-pay | Source: Home / Self Care | Attending: Gastroenterology

## 2018-06-21 DIAGNOSIS — K5730 Diverticulosis of large intestine without perforation or abscess without bleeding: Secondary | ICD-10-CM | POA: Diagnosis not present

## 2018-06-21 DIAGNOSIS — Z791 Long term (current) use of non-steroidal anti-inflammatories (NSAID): Secondary | ICD-10-CM | POA: Insufficient documentation

## 2018-06-21 DIAGNOSIS — R948 Abnormal results of function studies of other organs and systems: Secondary | ICD-10-CM | POA: Diagnosis not present

## 2018-06-21 DIAGNOSIS — I10 Essential (primary) hypertension: Secondary | ICD-10-CM | POA: Diagnosis not present

## 2018-06-21 DIAGNOSIS — I4891 Unspecified atrial fibrillation: Secondary | ICD-10-CM | POA: Diagnosis not present

## 2018-06-21 DIAGNOSIS — Z7982 Long term (current) use of aspirin: Secondary | ICD-10-CM | POA: Diagnosis not present

## 2018-06-21 DIAGNOSIS — K635 Polyp of colon: Secondary | ICD-10-CM | POA: Diagnosis not present

## 2018-06-21 DIAGNOSIS — Z79899 Other long term (current) drug therapy: Secondary | ICD-10-CM | POA: Diagnosis not present

## 2018-06-21 DIAGNOSIS — Z881 Allergy status to other antibiotic agents status: Secondary | ICD-10-CM | POA: Diagnosis not present

## 2018-06-21 DIAGNOSIS — I2510 Atherosclerotic heart disease of native coronary artery without angina pectoris: Secondary | ICD-10-CM | POA: Diagnosis not present

## 2018-06-21 DIAGNOSIS — K56699 Other intestinal obstruction unspecified as to partial versus complete obstruction: Secondary | ICD-10-CM | POA: Insufficient documentation

## 2018-06-21 DIAGNOSIS — Z87891 Personal history of nicotine dependence: Secondary | ICD-10-CM | POA: Insufficient documentation

## 2018-06-21 HISTORY — PX: COLONOSCOPY WITH PROPOFOL: SHX5780

## 2018-06-21 SURGERY — COLONOSCOPY WITH PROPOFOL
Anesthesia: General

## 2018-06-21 MED ORDER — PROPOFOL 10 MG/ML IV BOLUS
INTRAVENOUS | Status: AC
  Filled 2018-06-21: qty 20

## 2018-06-21 MED ORDER — PROPOFOL 500 MG/50ML IV EMUL
INTRAVENOUS | Status: AC
  Filled 2018-06-21: qty 50

## 2018-06-21 MED ORDER — PROPOFOL 500 MG/50ML IV EMUL
INTRAVENOUS | Status: DC | PRN
  Administered 2018-06-21: 150 ug/kg/min via INTRAVENOUS

## 2018-06-21 MED ORDER — PROPOFOL 10 MG/ML IV BOLUS
INTRAVENOUS | Status: DC | PRN
  Administered 2018-06-21: 60 mg via INTRAVENOUS

## 2018-06-21 MED ORDER — SODIUM CHLORIDE 0.9 % IV SOLN
INTRAVENOUS | Status: DC
  Administered 2018-06-21: 1000 mL via INTRAVENOUS

## 2018-06-21 NOTE — Anesthesia Post-op Follow-up Note (Signed)
Anesthesia QCDR form completed.        

## 2018-06-21 NOTE — Transfer of Care (Signed)
Immediate Anesthesia Transfer of Care Note  Patient: Erik Hendricks  Procedure(s) Performed: COLONOSCOPY WITH PROPOFOL (N/A )  Patient Location: PACU  Anesthesia Type:General  Level of Consciousness: sedated  Airway & Oxygen Therapy: Patient Spontanous Breathing and Patient connected to face mask oxygen  Post-op Assessment: Report given to RN and Post -op Vital signs reviewed and stable  Post vital signs: Reviewed and stable  Last Vitals:  Vitals Value Taken Time  BP 111/60 06/21/2018 11:04 AM  Temp 36.4 C 06/21/2018 11:04 AM  Pulse 73 06/21/2018 11:04 AM  Resp 11 06/21/2018 11:04 AM  SpO2 96 % 06/21/2018 11:04 AM    Last Pain:  Vitals:   06/21/18 1104  TempSrc: Tympanic  PainSc: Asleep         Complications: No apparent anesthesia complications

## 2018-06-21 NOTE — Op Note (Signed)
[facility]astro[facility]ology Patient Name: Erik Hendricks Procedure Date: 06/21/2018 9:39 AM MRN: [id number] Account #: [id number] Date of Birth: 07/05/1940 Admit Type: Outpatient Age: 78 Room: [facility] ENDO ROOM 4 Gender: Male Note Status: Finalized Procedure:            Colonoscopy Indications:          Abnormal PET scan of the GI tract Providers:            Lollie Sails, MD Referring MD:         Irven Easterly. Kary Kos, MD (Referring MD) Medicines:            Monitored Anesthesia Care Complications:        No immediate complications. Procedure:            Pre-Anesthesia Assessment:                       - ASA Grade Assessment: III - A patient with severe                        systemic disease.                       After obtaining informed consent, the colonoscope was                        passed under direct vision. Throughout the procedure,                        the patient's blood pressure, pulse, and oxygen                        saturations were monitored continuously. The                        Colonoscope was introduced through the anus with the                        intention of advancing to the cecum. The scope was                        advanced to the sigmoid colon before the procedure was                        aborted. Medications were given. The colonoscopy was                        performed without difficulty. The patient tolerated the                        procedure well. The quality of the bowel preparation                        was good. Findings:      A 4 mm polyp was found at 30 cm proximal to the anus. The polyp was       sessile. The polyp was removed with a cold snare. Resection and       retrieval were complete.      A apparent stenosis was found at 35 cm proximal to the anus and was       non-traversed. The  lumen at this point became very narrow and there was       no apparent pathway noted despite thorough evaluation. There is no       evidence of inflamation. There was no evidence of surgical anastomosis       or other surgery.      Multiple small-mouthed diverticula were found in the sigmoid colon, mid       sigmoid colon and distal sigmoid colon.      The digital rectal exam was normal.      Biopsies for histology were taken with a cold forceps from the sigmoid       colon and rectum for evaluation of microscopic colitis. Impression:           - One 4 mm polyp at 30 cm proximal to the anus, removed                        with a cold snare. Resected and retrieved.                       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                       - Diverticulosis in the sigmoid colon, in the mid                        sigmoid colon and in the distal sigmoid colon. Recommendation:       - Discharge patient to home [Means].                       - Perform a virtual colonoscopy at appointment to be                        scheduled. Procedure Code(s):    --- Professional ---                       [phone], 52, Colonoscopy, flexible; with removal of                        tumor(s), polyp(s), or other lesion(s) by snare                        technique                       45380, 59,52, Colonoscopy, flexible; with biopsy,                        single or multiple Diagnosis Code(s):    --- Professional ---                       K63.5, Polyp of colon                       K56.699, Other intestinal obstruction unspecified as to                        partial versus complete obstruction                       K57.30, Diverticulosis of large intestine without  perforation or abscess without bleeding                       R93.3, Abnormal findings on diagnostic imaging of other                        parts of digestive tract CPT copyright 2019 American Medical Association. All rights reserved. The codes documented in this report are preliminary and upon coder review may  be revised to meet current  compliance requirements. Lollie Sails, MD 06/21/2018 11:05:43 AM This report has been signed electronically. Number of Addenda: 0 Note Initiated On: 06/21/2018 9:39 AM Total Procedure Duration: 0 hours 40 minutes 41 seconds       [facility]

## 2018-06-21 NOTE — H&P (Signed)
Outpatient short stay form Pre-procedure 06/21/2018 9:49 AM Erik Hendricks  Primary Physician: Dr. [place] Hendricks  Reason for visit: Colonoscopy  History of present illness: Patient is a 78 year old male presenting today for colonoscopy in regards to an abnormal PET scan of the sigmoid colon area.  He has a history of ulcerative colitis and has been on balsalazide for at least 8 years.  Last colonoscopy was 2012.  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  He has not had a colonoscopy since 2012.  That colonoscopy had he was noted to have extensive inflammatory changes beginning in the sigmoid near where a possible stricture was present and multiple ulcerated areas in the sigmoid descending transverse hepatic area and ascending colon.  Patient denies any abdominal pain rectal bleeding mucus in the stools.  States his stools are normal in caliber and daily in occurrence.  Previous colonoscopy reports from 2007 and 2012 the pathology report from 2012 were reviewed.    Current Facility-Administered Medications:  .  0.9 %  sodium chloride infusion, , Intravenous, Continuous, Erik Sails, Hendricks, Last Rate: 20 mL/hr at 06/21/18 0818, 1,000 mL at 06/21/18 0818  Medications Prior to Admission  Medication Sig Dispense Refill Last Dose  . aspirin EC 81 MG tablet Take by mouth daily.    Past Week at Unknown time  . balsalazide (COLAZAL) 750 MG capsule Take 750 mg by mouth 2 (two) times daily.   Past Week at Unknown time  . diclofenac sodium (VOLTAREN) 1 % GEL Apply topically 4 (four) times daily.    Past Week at Unknown time  . DULoxetine (CYMBALTA) 30 MG capsule Take by mouth daily.   Past Week at Unknown time  . fluticasone (FLONASE) 50 MCG/ACT nasal spray Place 1 spray into both nostrils daily.   Past Week at Unknown time  . gabapentin (NEURONTIN) 400 MG capsule gabapentin 400 mg capsule   Past Week at Unknown time   . Krill Oil 350 MG CAPS Take 350 mg by mouth 2 (two) times daily.    Past Week at Unknown time  . losartan (COZAAR) 50 MG tablet TAKE 1 TABLET BY MOUTH EVERY DAY   06/20/2018 at Unknown time  . meloxicam (MOBIC) 7.5 MG tablet Take 1 tablet (7.5 mg total) by mouth daily. 30 tablet 1 06/20/2018 at Unknown time  . Mesalamine 800 MG TBEC Take 800 mg by mouth 3 (three) times daily.    Past Week at Unknown time  . metoprolol succinate (TOPROL-XL) 50 MG 24 hr tablet Take 50 mg by mouth 2 (two) times daily.    06/20/2018 at Unknown time  . metoprolol tartrate (LOPRESSOR) 50 MG tablet    06/20/2018 at Unknown time  . montelukast (SINGULAIR) 10 MG tablet Take by mouth.   06/20/2018 at Unknown time  . niacin (NIASPAN) 500 MG CR tablet Take 500 mg by mouth daily.    06/20/2018 at Unknown time  . pregabalin (LYRICA) 150 MG capsule Take 150 mg by mouth 2 (two) times daily.   06/20/2018 at Unknown time  . pregabalin (LYRICA) 50 MG capsule Take 150 mg by mouth 2 (two) times daily.    06/20/2018 at Unknown time  . simvastatin (ZOCOR) 20 MG tablet Take 20 mg by mouth daily.    06/20/2018 at Unknown time  . triamterene-hydrochlorothiazide (MAXZIDE-25) 37.5-25 MG tablet Take 1 tablet by mouth daily.   06/20/2018 at Unknown  time  . valsartan (DIOVAN) 80 MG tablet valsartan 80 mg tablet   06/20/2018 at Unknown time     Allergies  Allergen Reactions  . Ciprofloxacin Nausea Only     Past Medical History:  Diagnosis Date  . A-fib ([place])   . Anal fissure   . Colon polyp   . Hemorrhoid   . Hypertension   . Murmur   . Rectal abscess     Review of systems:      Physical Exam    Heart and lungs: Regular rate and rhythm without rub or gallop, lungs are bilaterally clear.    HEENT: Normocephalic atraumatic eyes are anicteric    Other:    Pertinant exam for procedure: Soft nontender nondistended bowel sounds positive normoactive.  Of note patient has a scar in the right lower quadrant consistent with previous  inguinal herniorrhaphy.  He states he gets no swellings or protuberance in that area.  Did not see any other scars on the surface of the abdomen.    Planned proceedures: Colonoscopy and indicated procedures. I have discussed the risks benefits and complications of procedures to include not limited to bleeding, infection, perforation and the risk of sedation and the patient wishes to proceed.    Erik Sails, [facility] 06/21/2018  9:49 AM

## 2018-06-21 NOTE — Anesthesia Procedure Notes (Signed)
Date/Time: 06/21/2018 10:30 AM Performed by: Allean Found, CRNA Pre-anesthesia Checklist: Patient identified, Emergency Drugs available, Suction available, Patient being monitored and Timeout performed Oxygen Delivery Method: Nasal cannula Placement Confirmation: positive ETCO2

## 2018-06-21 NOTE — Anesthesia Preprocedure Evaluation (Signed)
Anesthesia Evaluation  Patient identified by MRN, date of birth, ID band Patient awake    Reviewed: Allergy & Precautions, NPO status , Patient's Chart, lab work & pertinent test results, reviewed documented beta blocker date and time   Airway Mallampati: III  TM Distance: >3 FB     Dental  (+) Chipped   Pulmonary former smoker,           Cardiovascular hypertension, Pt. on medications and Pt. on home beta blockers + CAD  + dysrhythmias Atrial Fibrillation + Valvular Problems/Murmurs      Neuro/Psych    GI/Hepatic   Endo/Other    Renal/GU      Musculoskeletal   Abdominal   Peds  Hematology   Anesthesia Other Findings Denies CVA or TIA. Has had PVCs for a long time.  Reproductive/Obstetrics                             Anesthesia Physical Anesthesia Plan  ASA: III  Anesthesia Plan: General   Post-op Pain Management:    Induction: Intravenous  PONV Risk Score and Plan:   Airway Management Planned:   Additional Equipment:   Intra-op Plan:   Post-operative Plan:   Informed Consent: I have reviewed the patients History and Physical, chart, labs and discussed the procedure including the risks, benefits and alternatives for the proposed anesthesia with the patient or authorized representative who has indicated his/her understanding and acceptance.       Plan Discussed with: CRNA  Anesthesia Plan Comments:         Anesthesia Quick Evaluation

## 2018-06-21 NOTE — Anesthesia Postprocedure Evaluation (Signed)
Anesthesia Post Note  Patient: Erik Hendricks  Procedure(s) Performed: COLONOSCOPY WITH PROPOFOL (N/A )  Patient location during evaluation: Endoscopy Anesthesia Type: General Level of consciousness: awake and alert Pain management: pain level controlled Vital Signs Assessment: post-procedure vital signs reviewed and stable Respiratory status: spontaneous breathing, nonlabored ventilation, respiratory function stable and patient connected to nasal cannula oxygen Cardiovascular status: blood pressure returned to baseline and stable Postop Assessment: no apparent nausea or vomiting Anesthetic complications: no     Last Vitals:  Vitals:   06/21/18 1124 06/21/18 1134  BP: 118/68 130/82  Pulse: 61 (!) 58  Resp: 16 15  Temp:    SpO2: 97% 97%    Last Pain:  Vitals:   06/21/18 1134  TempSrc:   PainSc: 0-No pain                 Traeton Bordas S

## 2018-06-24 ENCOUNTER — Other Ambulatory Visit: Payer: Self-pay | Admitting: Gastroenterology

## 2018-06-24 DIAGNOSIS — R948 Abnormal results of function studies of other organs and systems: Secondary | ICD-10-CM

## 2018-06-24 DIAGNOSIS — K56600 Partial intestinal obstruction, unspecified as to cause: Secondary | ICD-10-CM

## 2018-06-24 LAB — SURGICAL PATHOLOGY

## 2018-06-25 ENCOUNTER — Encounter: Payer: Self-pay | Admitting: Gastroenterology

## 2018-06-26 ENCOUNTER — Ambulatory Visit: Payer: Medicare Other

## 2018-08-19 DIAGNOSIS — I4811 Longstanding persistent atrial fibrillation: Secondary | ICD-10-CM | POA: Insufficient documentation

## 2019-02-11 DIAGNOSIS — K5100 Ulcerative (chronic) pancolitis without complications: Secondary | ICD-10-CM | POA: Diagnosis present

## 2019-02-28 ENCOUNTER — Other Ambulatory Visit: Payer: Self-pay | Admitting: Student

## 2019-02-28 DIAGNOSIS — R933 Abnormal findings on diagnostic imaging of other parts of digestive tract: Secondary | ICD-10-CM

## 2019-02-28 DIAGNOSIS — R948 Abnormal results of function studies of other organs and systems: Secondary | ICD-10-CM

## 2019-02-28 DIAGNOSIS — K56600 Partial intestinal obstruction, unspecified as to cause: Secondary | ICD-10-CM

## 2019-03-24 ENCOUNTER — Other Ambulatory Visit: Payer: Self-pay | Admitting: Family Medicine

## 2019-03-24 DIAGNOSIS — R29898 Other symptoms and signs involving the musculoskeletal system: Secondary | ICD-10-CM

## 2019-03-28 ENCOUNTER — Other Ambulatory Visit: Payer: Self-pay

## 2019-03-28 ENCOUNTER — Ambulatory Visit
Admission: RE | Admit: 2019-03-28 | Discharge: 2019-03-28 | Disposition: A | Discharge status: Home / Self Care | Payer: 59 | Source: Ambulatory Visit | Attending: Family Medicine | Admitting: Family Medicine

## 2019-03-28 DIAGNOSIS — R29898 Other symptoms and signs involving the musculoskeletal system: Secondary | ICD-10-CM | POA: Insufficient documentation

## 2019-04-13 ENCOUNTER — Ambulatory Visit: Payer: 59

## 2020-01-13 IMAGING — CT CT NECK WITH CONTRAST
4 of 6 series · 13 of 33 positions shown, 15 images · IV contrast (omnipaque)
Comparison: None.

CLINICAL DATA: Benign mass  parotid gland.

EXAM:
CT NECK WITH CONTRAST
TECHNIQUE: Multidetector CT imaging of the neck was performed using the
standard protocol following the bolus administration of intravenous
contrast.
CONTRAST:  75mL OMNIPAQUE IOHEXOL 300 MG/ML  SOLN

[Series 3: axial bone neck · axial · 0.71mm/px · z∈[-798,-706]mm · 2 of 139 slices shown]
[im 47/139  bone]
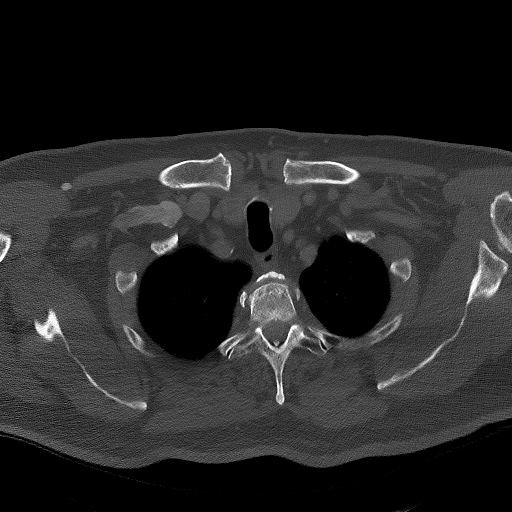
[im 93/139  bone]
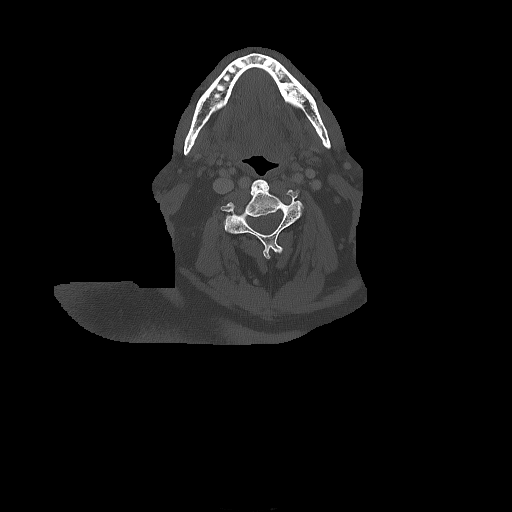

[Series 5: coronal neck neck (person_name) · coronal · 0.63mm/px · 3 of 166 slices shown]
[im 34/166  bone]
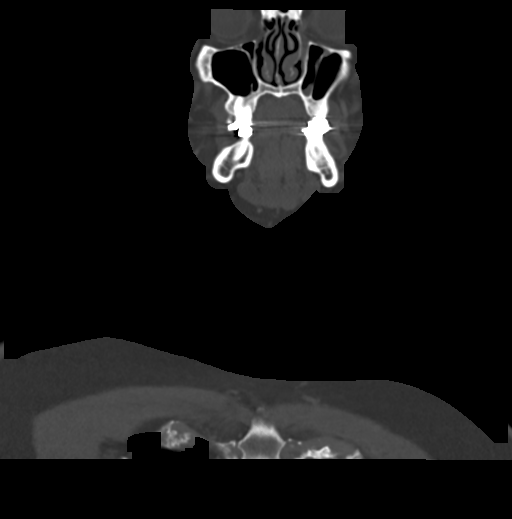
[im 67/166  bone]
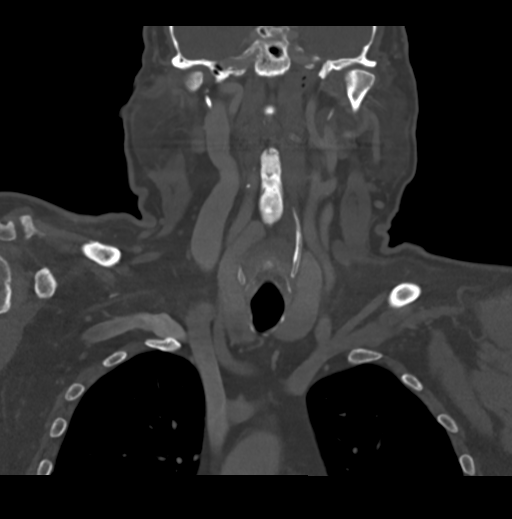
[im 99/166  bone]
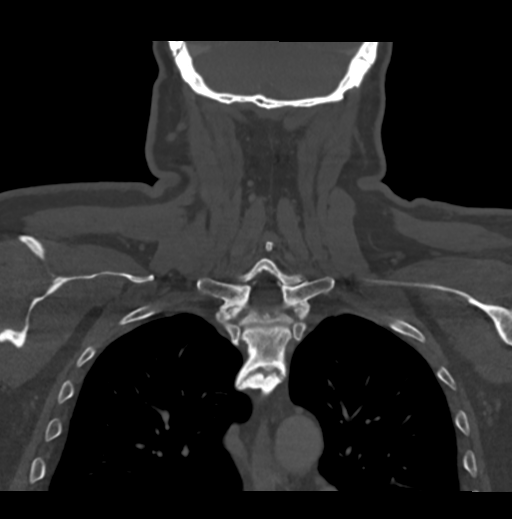

[Series 7: sagittal neck neck (person_name) · sagittal · 0.64mm/px · 5 of 161 slices shown, 6 images]
[im 54/161  bone]
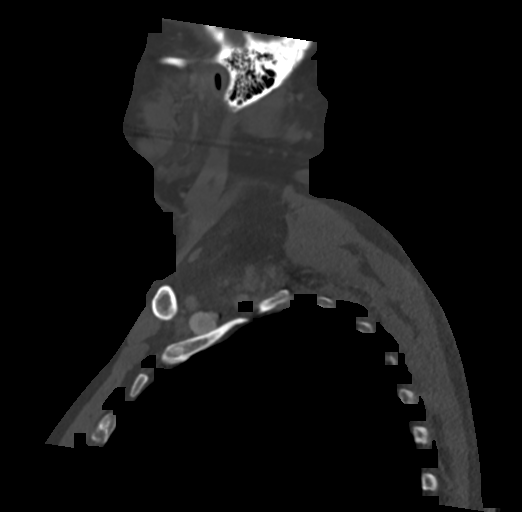
[im 67/161  bone]
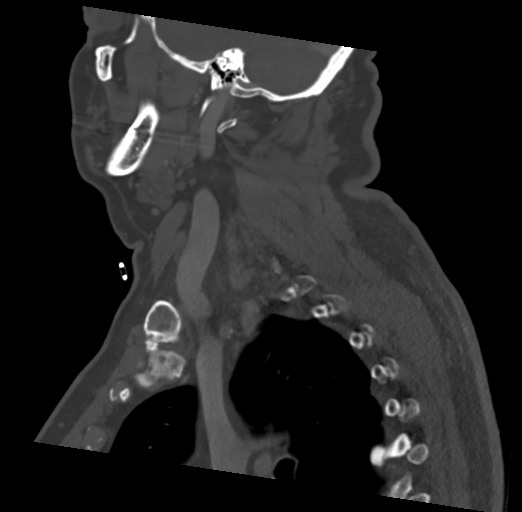
[im 81/161  soft-tissue]
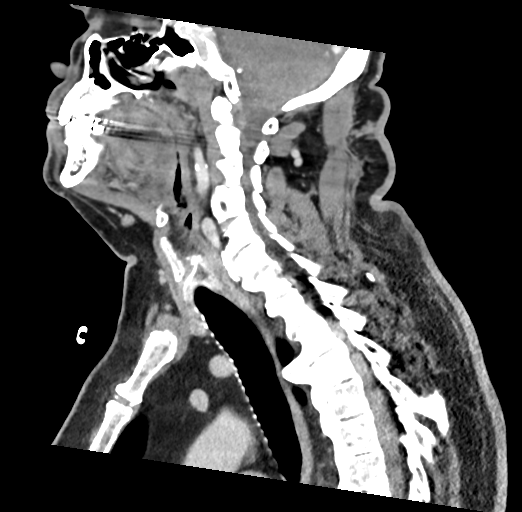
[im 81/161  bone]
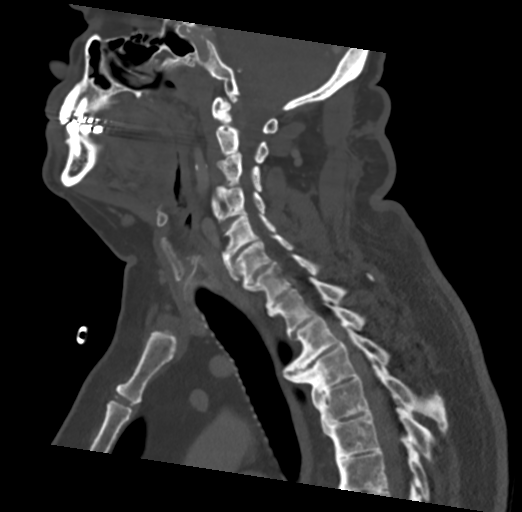
[im 94/161  bone]
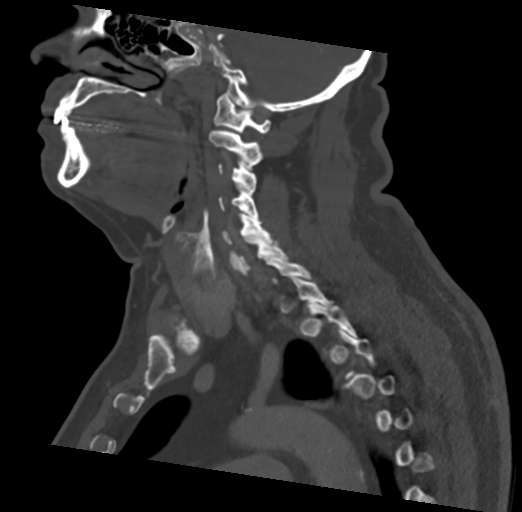
[im 107/161  bone]
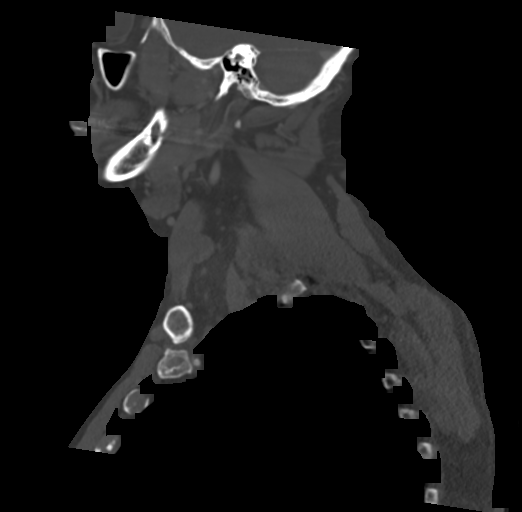

[Series 9: ax oropharynx neck neck (person_name) · axial · 0.63mm/px · z∈[-859,-699]mm · 3 of 163 slices shown, 4 images]
[im 41/163  soft-tissue]
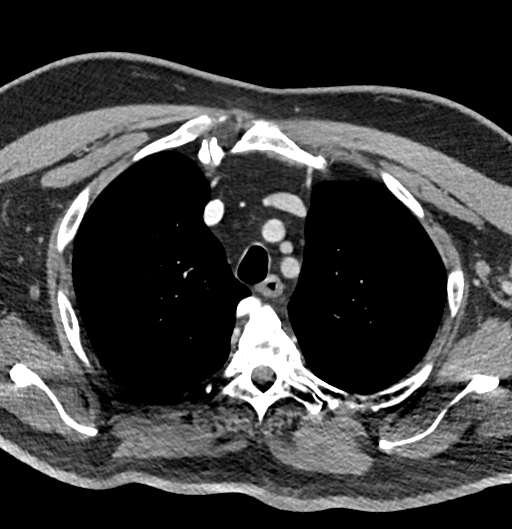
[im 41/163  bone]
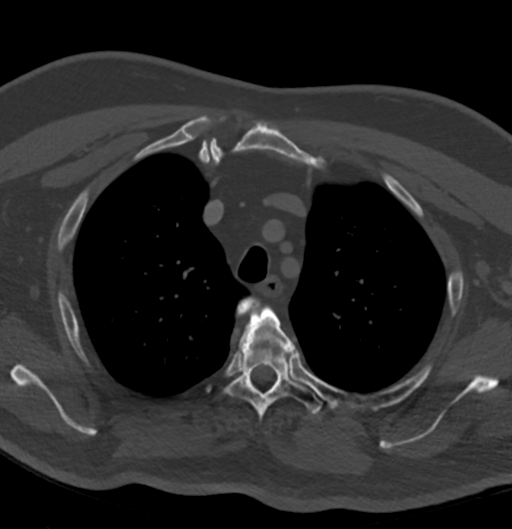
[im 82/163  bone]
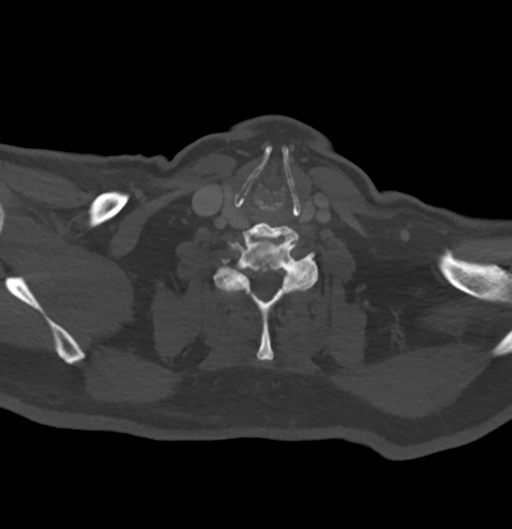
[im 122/163  bone]
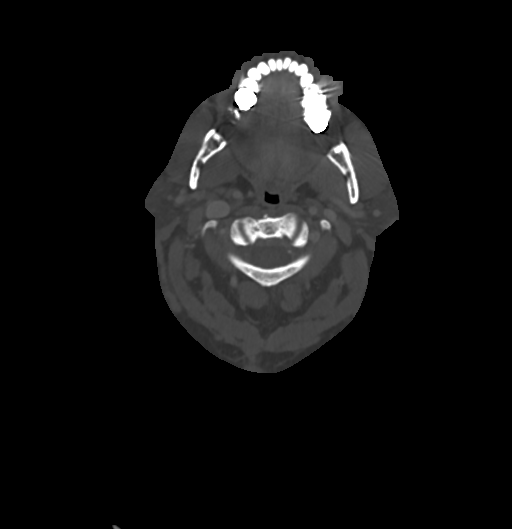

[13 of 33 positions shown; findings below may reference images not displayed]

FINDINGS: Pharynx and larynx: Normal. No mass or swelling.

Salivary glands: Left parotid mass measuring 16 x 14 mm. Soft tissue
density with punctate calcification. Margins of the mass are
ill-defined and infiltrate the parotid gland. Mass extends into the
subcutaneous tissues outside of the parotid gland. Right parotid
gland normal aside from a small 4 mm lymph node anteriorly.
Submandibular glands normal bilaterally without mass or stone

Thyroid: 7 mm nodule right lower pole thyroid.  Otherwise negative.

Lymph nodes: No pathologic or enlarged lymph nodes. 4 mm lymph nodes
are seen on the surface of the parotid gland bilaterally.

Vascular: Normal vascular enhancement.

Limited intracranial: Negative

Visualized orbits: Negative for mass.  Bilateral cataract surgery.

Mastoids and visualized paranasal sinuses: Mild mucosal edema
paranasal sinuses. No air-fluid levels.

Skeleton: Degenerative change throughout the cervical spine. No
acute skeletal abnormality.

Upper chest: Negative

Other: None
IMPRESSION: 16 x 14 mm left parotid mass. The mass has some aggressive features
including irregular infiltration of the adjacent parotid gland as
well as infiltration of the subcutaneous tissues. Findings are
suspicious for parotid carcinoma. Surgical resection suggested. No
malignant adenopathy in the neck.

## 2020-06-09 IMAGING — US SALIVARY GLAND CORE BIOPSY
1 series · 10 of 10 positions shown · non-contrast
Comparison: none

INDICATION: Left parotid mass

[Series 1: salivary gland core biopsy · 0.06mm/px · 10 acquisitions, 10 frames shown]
[im 1/10]
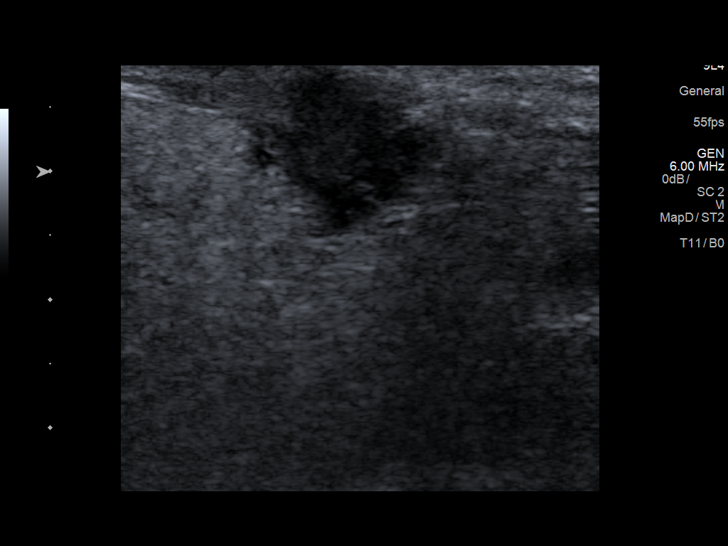
[im 2/10]
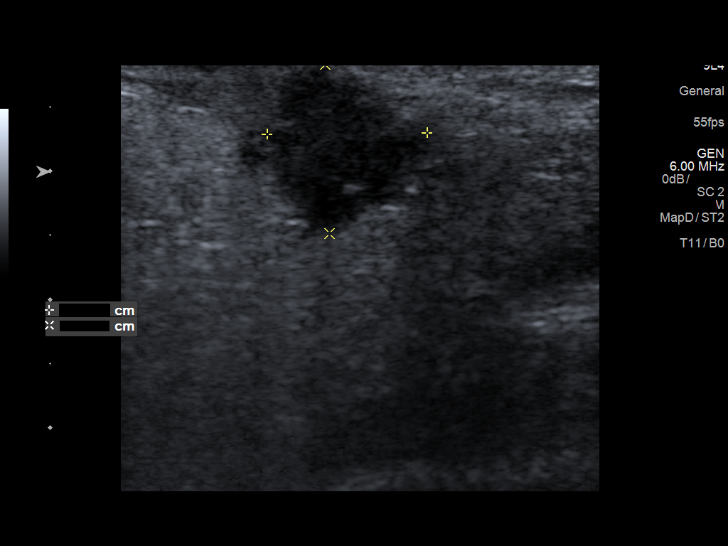
[im 3/10]
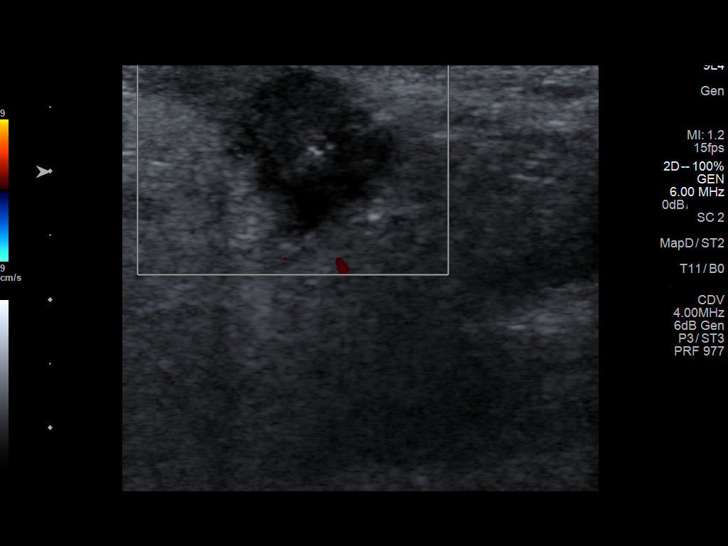
[im 4/10]
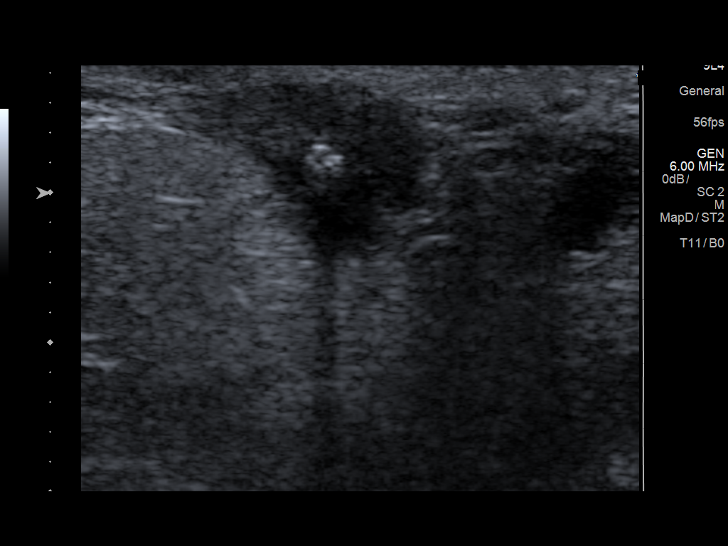
[im 5/10]
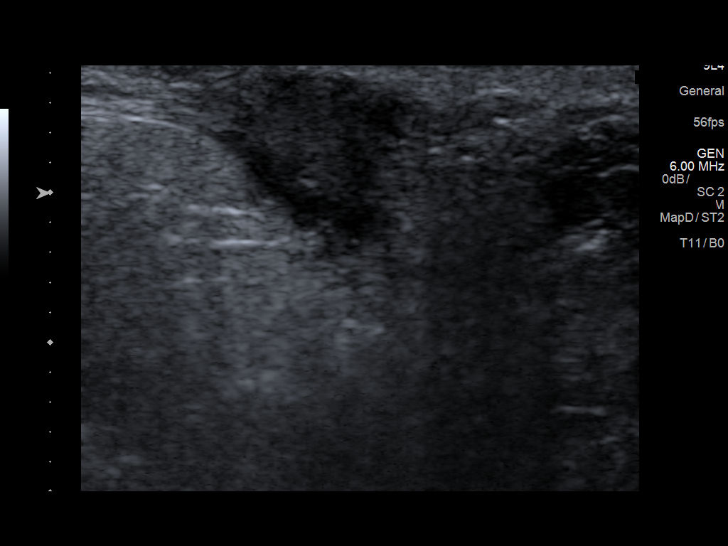
[im 6/10]
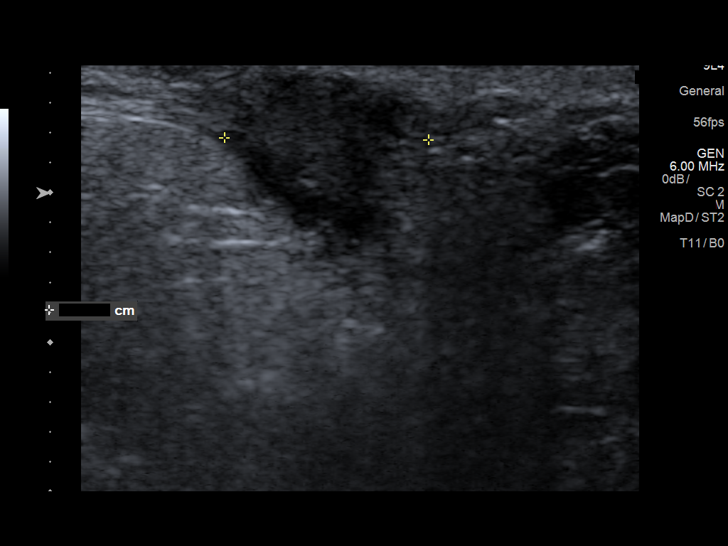
[im 7/10]
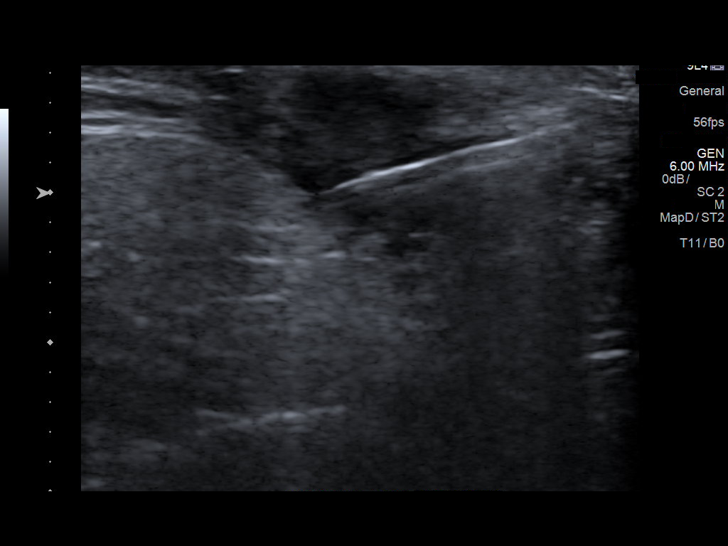
[im 8/10]
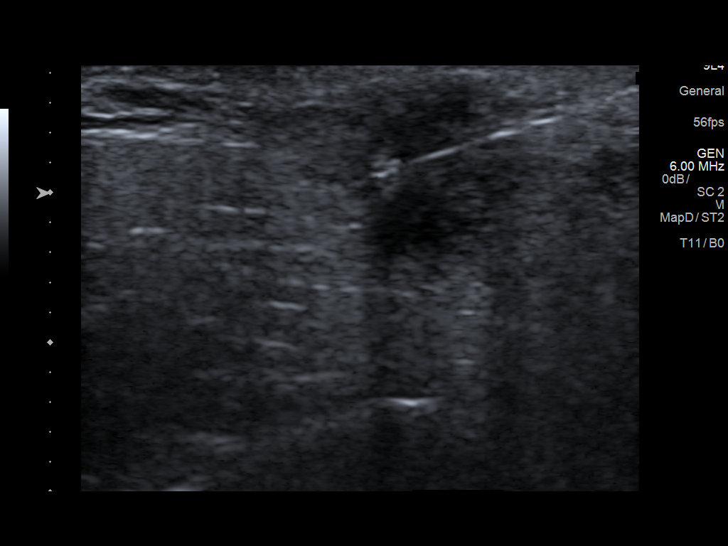
[im 9/10]
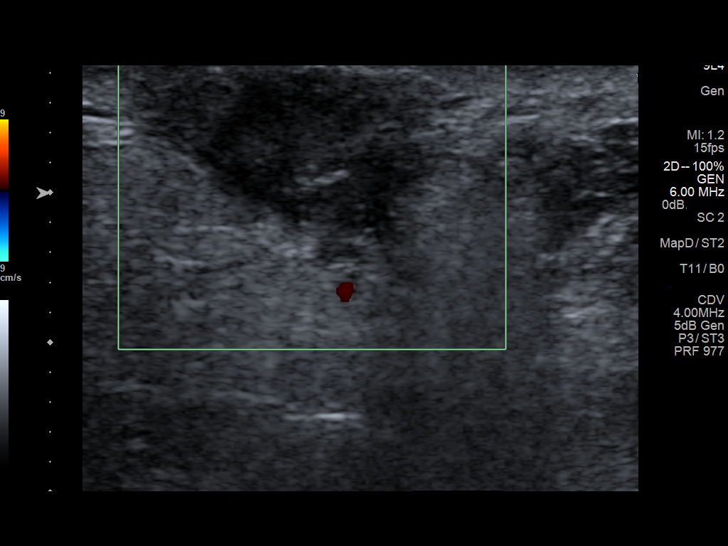
[im 10/10]
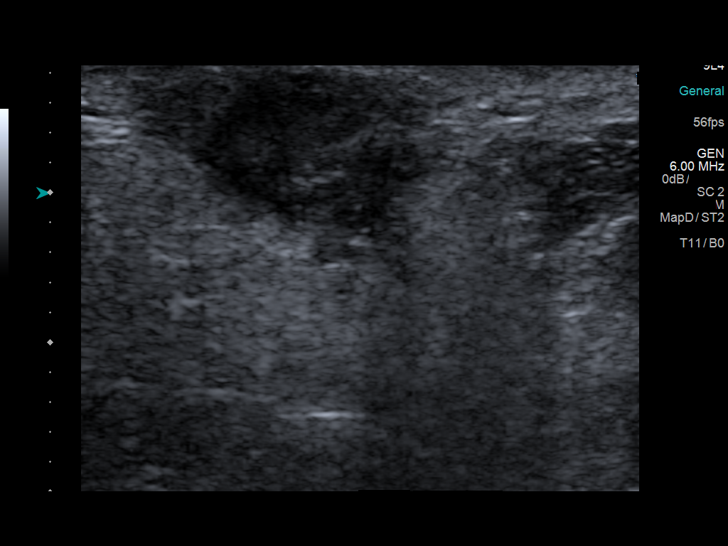

[10 of 10 positions shown; findings below may reference images not displayed]

EXAM:
ULTRASOUND-GUIDED CORE BIOPSY OF A LEFT PAROTID MASS

MEDICATIONS:
None.

ANESTHESIA/SEDATION:
Fentanyl 100 mcg IV; Versed 2 mg IV

Moderate Sedation Time:  13 minutes

The patient was continuously monitored during the procedure by the
interventional radiology nurse under my direct supervision.

FLUOROSCOPY TIME:  None

COMPLICATIONS:
None immediate.

PROCEDURE:
Informed written consent was obtained from the patient after a
thorough discussion of the procedural risks, benefits and
alternatives. All questions were addressed. Maximal Sterile Barrier
Technique was utilized including caps, mask, sterile gowns, sterile
gloves, sterile drape, hand hygiene and skin antiseptic. A timeout
was performed prior to the initiation of the procedure.

The left neck was prepped with ChloraPrep in a sterile fashion, and
a sterile drape was applied covering the operative field. A sterile
gown and sterile gloves were used for the procedure.

Under sonographic guidance, 2 18 gauge core biopsies of the left
parotid mass were obtained. Final imaging was performed.

Patient tolerated the procedure well without complication. Vital
sign monitoring by nursing staff during the procedure will continue
as patient is in the special procedures unit for post procedure
observation.
FINDINGS: The images document guide needle placement within the left parotid
mass. Post biopsy images demonstrate no hemorrhage.
IMPRESSION: Successful ultrasound-guided core biopsy of a left parotid mass

## 2021-09-27 ENCOUNTER — Other Ambulatory Visit: Payer: Self-pay | Admitting: Neurology

## 2021-09-27 DIAGNOSIS — R531 Weakness: Secondary | ICD-10-CM

## 2021-10-10 ENCOUNTER — Ambulatory Visit
Admission: RE | Admit: 2021-10-10 | Discharge: 2021-10-10 | Disposition: A | Discharge status: Home / Self Care | Payer: Medicare Other | Source: Ambulatory Visit | Attending: Neurology | Admitting: Neurology

## 2021-10-10 DIAGNOSIS — R531 Weakness: Secondary | ICD-10-CM

## 2021-10-15 ENCOUNTER — Emergency Department
Admission: EM | Admit: 2021-10-15 | Discharge: 2021-10-15 | Disposition: A | Discharge status: Skilled Nursing Facility | Payer: 59 | Source: Home / Self Care | Attending: Emergency Medicine | Admitting: Emergency Medicine

## 2021-10-15 ENCOUNTER — Other Ambulatory Visit: Payer: Self-pay

## 2021-10-15 ENCOUNTER — Emergency Department
Admission: EM | Admit: 2021-10-15 | Discharge: 2021-10-15 | Discharge status: Against Medical Advice | Payer: 59 | Attending: Emergency Medicine | Admitting: Emergency Medicine

## 2021-10-15 ENCOUNTER — Encounter: Payer: Self-pay | Admitting: Emergency Medicine

## 2021-10-15 DIAGNOSIS — Z5321 Procedure and treatment not carried out due to patient leaving prior to being seen by health care provider: Secondary | ICD-10-CM | POA: Diagnosis not present

## 2021-10-15 DIAGNOSIS — Z7901 Long term (current) use of anticoagulants: Secondary | ICD-10-CM | POA: Insufficient documentation

## 2021-10-15 DIAGNOSIS — K1379 Other lesions of oral mucosa: Secondary | ICD-10-CM

## 2021-10-15 DIAGNOSIS — K0889 Other specified disorders of teeth and supporting structures: Secondary | ICD-10-CM | POA: Insufficient documentation

## 2021-10-15 LAB — CBC WITH DIFFERENTIAL/PLATELET
Abs Immature Granulocytes: 0.03 10*3/uL (ref 0.00–0.07)
Basophils Absolute: 0 10*3/uL (ref 0.0–0.1)
Basophils Relative: 1 %
Eosinophils Absolute: 0.1 10*3/uL (ref 0.0–0.5)
Eosinophils Relative: 2 %
HCT: 37.6 % — ABNORMAL LOW (ref 39.0–52.0)
Hemoglobin: 12.3 g/dL — ABNORMAL LOW (ref 13.0–17.0)
Immature Granulocytes: 1 %
Lymphocytes Relative: 19 %
Lymphs Abs: 1.2 10*3/uL (ref 0.7–4.0)
MCH: 28.5 pg (ref 26.0–34.0)
MCHC: 32.7 g/dL (ref 30.0–36.0)
MCV: 87 fL (ref 80.0–100.0)
Monocytes Absolute: 0.5 10*3/uL (ref 0.1–1.0)
Monocytes Relative: 8 %
Neutro Abs: 4.5 10*3/uL (ref 1.7–7.7)
Neutrophils Relative %: 69 %
Platelets: 217 10*3/uL (ref 150–400)
RBC: 4.32 MIL/uL (ref 4.22–5.81)
RDW: 13.3 % (ref 11.5–15.5)
WBC: 6.4 10*3/uL (ref 4.0–10.5)
nRBC: 0 % (ref 0.0–0.2)

## 2021-10-15 LAB — PROTIME-INR
INR: 1.6 — ABNORMAL HIGH (ref 0.8–1.2)
Prothrombin Time: 19.2 seconds — ABNORMAL HIGH (ref 11.4–15.2)

## 2021-10-15 MED ORDER — TRANEXAMIC ACID FOR EPISTAXIS
500.0000 mg | Freq: Once | TOPICAL | Status: AC
Start: 2021-10-15 — End: 2021-10-15
  Administered 2021-10-15: 500 mg via TOPICAL
  Filled 2021-10-15: qty 10

## 2021-10-15 NOTE — ED Notes (Signed)
Patient declined discharge vital signs. 

## 2021-10-15 NOTE — ED Triage Notes (Signed)
Pt called from [facility] to treatment room, no response 

## 2021-10-15 NOTE — ED Provider Notes (Signed)
[facility] Provider Note    Event Date/Time   First MD Initiated Contact with Patient 10/15/21 1018     (approximate)   History   Dental Pain   HPI  Erik Hendricks is a 81 y.o. male with history of TIA, A-fib, CVA who is currently taking Xarelto presents to the emergency department with bleeding from a dental procedure.  Patient states a dental procedure was 2 weeks ago.  Has continued to have bleeding.  States the dentist put stitches in the area but states it is not healing.  He did not cut back on his Xarelto has been taking it as recommended by his physician.  Is unsure what to do.  States that will slow down when he uses teabags or gauze but as soon as his tongue hits the area it begins to bleed again.      Physical Exam   Triage Vital Signs: ED Triage Vitals  Enc Vitals Group     BP 10/15/21 0938 (!) 144/99     Pulse Rate 10/15/21 0938 91     Resp 10/15/21 0938 18     Temp 10/15/21 0938 98.3 F (36.8 C)     Temp Source 10/15/21 0938 Oral     SpO2 10/15/21 0938 95 %     Weight 10/15/21 0939 250 lb (113.4 kg)     Height 10/15/21 0939 6' (1.829 m)     Head Circumference --      Peak Flow --      Pain Score 10/15/21 0939 8     Pain Loc --      Pain Edu? --      Excl. in [place]? --     Most recent vital signs: Vitals:   10/15/21 0938  BP: (!) 144/99  Pulse: 91  Resp: 18  Temp: 98.3 F (36.8 C)  SpO2: 95%     General: Awake, no distress.   CV:  Good peripheral perfusion. regular rate and  rhythm Resp:  Normal effort. Abd:  No distention.   Other:  Patient with active bleeding noted from the upper molar area   ED Results / Procedures / Treatments   Labs (all labs ordered are listed, but only abnormal results are displayed) Labs Reviewed - No data to display   EKG     RADIOLOGY     PROCEDURES:   Procedures   MEDICATIONS ORDERED IN ED: Medications  tranexamic acid (CYKLOKAPRON) 1000 MG/10ML topical solution 500  mg (500 mg Topical Given 10/15/21 1146)     IMPRESSION / MDM / [place] / ED COURSE  I reviewed the triage vital signs and the nursing notes.                              Differential diagnosis includes, but is not limited to, dental injury, active bleeding due to dental injury, active bleeding secondary to Xarelto  Patient's presentation is most consistent with acute, uncomplicated illness.   The patient has noted active bleeding.  In discussion with Dr. Eugenia Mcalpine we think we will try using TXA soaked in gauze on the area and then hopefully be able to apply Surgicel into the wound to stop the bleeding   TXA was applied on gauze to the upper gum, bleeding was controlled with TXA.  I did remove some of the clot from the area placed saline on the Surgicel and trying to attach it  to the gum.  It was attached after 15 minutes however the patient keeps messing with it with his tongue.  I placed more gauze and told him not to mess with the area with his tongue.  He will need to see the dentist.  I did encourage him to stop his Xarelto for another day.  He is not taking it today at all he may need 3 days for it to be able to clot without difficulty.  He is in agreement with treatment plan.  He was discharged stable condition in care of his family.   FINAL CLINICAL IMPRESSION(S) / ED DIAGNOSES   Final diagnoses:  Bleeding in mouth     Rx / DC Orders   ED Discharge Orders     None        Note:  This document was prepared using Dragon voice recognition software and may include unintentional dictation errors.    Versie Starks, PA-C 10/15/21 1313    Vanessa Soso, MD 10/16/21 [phone]

## 2021-10-15 NOTE — ED Triage Notes (Signed)
Pt reports had dental work done over 2 weeks ago and the area has bled intermittently since then. Pt reports his dentist put some stitches in to stop the bleeding but then it started again last om. Pt reports no bleeding at this time but states it will start back. Pt takes xarelto

## 2021-10-15 NOTE — ED Notes (Signed)
Pt called x2 with no response °

## 2021-10-15 NOTE — ED Notes (Signed)
Lav, green and blue tops sent to lab

## 2021-10-15 NOTE — ED Triage Notes (Signed)
Pt states he had a  left upper tooth pulled 8 days ago, pt states continues to have bleeding from socket. Pt is on a blood thinner. Small amount of bleeding noted. Pt with

## 2021-10-15 NOTE — ED Triage Notes (Signed)
Pt called x's 2, no response

## 2021-10-15 NOTE — Discharge Instructions (Signed)
Stop your Xarelto for 2 to 3 days so the area can heal.  Apply the medication as needed to a piece of gauze and place it in the area of bleeding.  Hopefully the Surgicel will adhere.  Try not to use your tongue on this area.

## 2022-08-28 ENCOUNTER — Encounter: Payer: 59 | Attending: Family Medicine | Admitting: Dietician

## 2022-08-28 ENCOUNTER — Encounter: Payer: Self-pay | Admitting: Dietician

## 2022-08-28 DIAGNOSIS — E119 Type 2 diabetes mellitus without complications: Secondary | ICD-10-CM | POA: Diagnosis present

## 2022-08-28 DIAGNOSIS — E1165 Type 2 diabetes mellitus with hyperglycemia: Secondary | ICD-10-CM | POA: Insufficient documentation

## 2022-08-28 DIAGNOSIS — Z713 Dietary counseling and surveillance: Secondary | ICD-10-CM | POA: Diagnosis not present

## 2022-08-28 NOTE — Patient Instructions (Addendum)
Inform the nail technician that you are a diabetic and to take extra care when trimming nails. Consider scheduling a consultation with a podiatrist to get an understanding of of proper foot care.  Every Monday, Wednesday, and Friday at 1:00 or 2:00 in the afternoon, ride your stationary bike for 10 - 15 minutes at a comfortable pace.  Put your dumbbells next to your favorite chair! Pick them up and do some various arm exercises throughout the day while you are seated.  Begin to recognize carbohydrates, proteins, and non-starchy vegetables in your food choices!  Begin to build your meals using the proportions of the Balanced Plate. First, select your carb choice(s) for the meal. Make this 25% of your meal. Next, select your source of protein to pair with your carb choice(s). Make this another 25% of your meal. Finally, complete your meal with a variety of non-starchy vegetables. Make this the remaining 50% of your meal.

## 2022-08-28 NOTE — Progress Notes (Signed)
Diabetes Self-Management Education  Visit Type: First/Initial  Appt. Start Time: 0805 Appt. End Time: 0920  08/28/2022  [name], identified by name and date of birth, is a 82 y.o. male with a diagnosis of Diabetes: Type 2.   ASSESSMENT  There were no vitals taken for this visit. There is no height or weight on file to calculate BMI.  Pt wife Erik Hendricks is present for appointment Pt states that they are not sure what medications they are taking, prescribed Glipizide and thinks they are taking it. Wife reports wanting to figure out what to eat to control blood sugar Pt states they have lost a desire to eat as much over the last year, lost ~10 lbs. this year. Pt reports eating a regular breakfast, small lunch, large dinner and ice cream for dessert every day Pt reports eating out every day, wife still working but pt retired. Pt reports liking to eat ice cream, has started eating a light ice cream and a smaller portion of it. Pt reports numbness in hands and feet, has been going on for the last 6 months to a year. Pt reports noticing scrapes take a little longer to heal than they used to.  Pt reports getting pedicures, states that they are afraid to cut their nails due to numbness. Pt reports getting easily fatigued when doing physical activity, states that they have an exercise bike and dumbbells in the garage but says they are "too lazy"  to use them.   Diabetes Self-Management Education - 08/28/22 0827       Visit Information   Visit Type First/Initial      Initial Visit   Diabetes Type Type 2    Date Diagnosed 4 years ago    Are you currently following a meal plan? No    Are you taking your medications as prescribed? Yes      Health Coping   How would you rate your overall health? Fair      Psychosocial Assessment   Patient Belief/Attitude about Diabetes Motivated to manage diabetes    What is the hardest part about your diabetes right now, causing you the most concern, or  is the most worrisome to you about your diabetes?   Making healty food and beverage choices    Self-care barriers None    Self-management support Doctor's office;Family    Other persons present Patient;Spouse/SO    Patient Concerns Nutrition/Meal planning    Special Needs None    Preferred Learning Style No preference indicated    Learning Readiness Ready    How often do you need to have someone help you when you read instructions, pamphlets, or other written materials from your doctor or pharmacy? 1 - Never    What is the last grade level you completed in school? college      Pre-Education Assessment   Patient understands the diabetes disease and treatment process. Needs Instruction    Patient understands incorporating nutritional management into lifestyle. Needs Instruction    Patient undertands incorporating physical activity into lifestyle. Needs Instruction    Patient understands using medications safely. Needs Instruction    Patient understands monitoring blood glucose, interpreting and using results Needs Instruction    Patient understands prevention, detection, and treatment of acute complications. Needs Instruction    Patient understands prevention, detection, and treatment of chronic complications. Needs Instruction    Patient understands how to develop strategies to address psychosocial issues. Needs Instruction    Patient understands how to develop  strategies to promote health/change behavior. Needs Instruction      Complications   Last HgB A1C per patient/outside source 7.7 %   08/01/2022   How often do you check your blood sugar? 0 times/day (not testing)    Have you had a dilated eye exam in the past 12 months? No    Have you had a dental exam in the past 12 months? Yes    Are you checking your feet? Yes    How many days per week are you checking your feet? 7      Dietary Intake   Breakfast Banana nut muffin, coffee w/ sweet and low, powdered creamer    Lunch [employer] Chicken  pot pie, cole slaw    Dinner tomato sandwich on whole wheat, Corn on the cob,    Snack (evening) cup of ice cream    Beverage(s) Diet [place] dry, water      Activity / Exercise   Activity / Exercise Type ADL's    How many days per week do you exercise? 4    How many minutes per day do you exercise? 10    Total minutes per week of exercise 40      Patient Education   Previous Diabetes Education No    Disease Pathophysiology Factors that contribute to the development of diabetes;Explored patient's options for treatment of their diabetes    Healthy Eating Role of diet in the treatment of diabetes and the relationship between the three main macronutrients and blood glucose level;Food label reading, portion sizes and measuring food.;Plate Method;Information on hints to eating out and maintain blood glucose control.    Being Active Role of exercise on diabetes management, blood pressure control and cardiac health.    Medications Reviewed patients medication for diabetes, action, purpose, timing of dose and side effects.    Acute complications Taught prevention, symptoms, and  treatment of hypoglycemia - the 15 rule.    Chronic complications Assessed and discussed foot care and prevention of foot problems;Relationship between chronic complications and blood glucose control;Retinopathy and reason for yearly dilated eye exams    Diabetes Stress and Support Worked with patient to identify barriers to care and solutions      Individualized Goals (developed by patient)   Nutrition Follow meal plan discussed;General guidelines for healthy choices and portions discussed    Physical Activity Exercise 3-5 times per week    Medications Other (comment)   Be sure which medications you are taking, especially Glipizde   Monitoring  Not Applicable    Problem Solving Eating Pattern      Post-Education Assessment   Patient understands the diabetes disease and treatment process. Needs Review    Patient  understands incorporating nutritional management into lifestyle. Needs Review    Patient undertands incorporating physical activity into lifestyle. Needs Review    Patient understands using medications safely. Needs Review    Patient understands monitoring blood glucose, interpreting and using results Needs Review    Patient understands prevention, detection, and treatment of acute complications. Needs Review    Patient understands prevention, detection, and treatment of chronic complications. Needs Review    Patient understands how to develop strategies to address psychosocial issues. Needs Review    Patient understands how to develop strategies to promote health/change behavior. Needs Review      Outcomes   Expected Outcomes Demonstrated interest in learning but significant barriers to change    Future DMSE 3-4 months    Program Status Not Completed  Individualized Plan for Diabetes Self-Management Training:   Learning Objective:  Patient will have a greater understanding of diabetes self-management. Patient education plan is to attend individual and/or group sessions per assessed needs and concerns.   Plan:   Patient Instructions  Inform the nail technician that you are a diabetic and to take extra care when trimming nails. Consider scheduling a consultation with a podiatrist to get an understanding of of proper foot care.  Every Monday, Wednesday, and Friday at 1:00 or 2:00 in the afternoon, ride your stationary bike for 10 - 15 minutes at a comfortable pace.  Put your dumbbells next to your favorite chair! Pick them up and do some various arm exercises throughout the day while you are seated.  Begin to recognize carbohydrates, proteins, and non-starchy vegetables in your food choices!  Begin to build your meals using the proportions of the Balanced Plate. First, select your carb choice(s) for the meal. Make this 25% of your meal. Next, select your source of  protein to pair with your carb choice(s). Make this another 25% of your meal. Finally, complete your meal with a variety of non-starchy vegetables. Make this the remaining 50% of your meal.   Expected Outcomes:  Demonstrated interest in learning but significant barriers to change  Education material provided: ADA - How to Thrive: A Guide for Your Journey with Diabetes and My Plate  If problems or questions, patient to contact team via:  Phone and Email  Future DSME appointment: 3-4 months

## 2022-11-23 ENCOUNTER — Ambulatory Visit: Payer: Medicare Other | Admitting: Dietician

## 2022-12-13 ENCOUNTER — Other Ambulatory Visit: Payer: Self-pay | Admitting: Unknown Physician Specialty

## 2022-12-13 DIAGNOSIS — R430 Anosmia: Secondary | ICD-10-CM

## 2022-12-15 ENCOUNTER — Inpatient Hospital Stay: Payer: 59

## 2022-12-15 ENCOUNTER — Emergency Department: Payer: 59

## 2022-12-15 ENCOUNTER — Other Ambulatory Visit: Payer: Self-pay

## 2022-12-15 ENCOUNTER — Inpatient Hospital Stay
Admission: EM | Admit: 2022-12-15 | Discharge: 2022-12-17 | DRG: 180 | Disposition: A | Discharge status: Home / Self Care | Payer: 59 | Attending: Internal Medicine | Admitting: Internal Medicine

## 2022-12-15 DIAGNOSIS — R918 Other nonspecific abnormal finding of lung field: Secondary | ICD-10-CM

## 2022-12-15 DIAGNOSIS — E871 Hypo-osmolality and hyponatremia: Secondary | ICD-10-CM | POA: Diagnosis present

## 2022-12-15 DIAGNOSIS — E669 Obesity, unspecified: Secondary | ICD-10-CM | POA: Diagnosis present

## 2022-12-15 DIAGNOSIS — Z7901 Long term (current) use of anticoagulants: Secondary | ICD-10-CM

## 2022-12-15 DIAGNOSIS — I5032 Chronic diastolic (congestive) heart failure: Secondary | ICD-10-CM | POA: Diagnosis present

## 2022-12-15 DIAGNOSIS — E785 Hyperlipidemia, unspecified: Secondary | ICD-10-CM | POA: Diagnosis present

## 2022-12-15 DIAGNOSIS — M48061 Spinal stenosis, lumbar region without neurogenic claudication: Secondary | ICD-10-CM | POA: Insufficient documentation

## 2022-12-15 DIAGNOSIS — E876 Hypokalemia: Secondary | ICD-10-CM | POA: Insufficient documentation

## 2022-12-15 DIAGNOSIS — Z79899 Other long term (current) drug therapy: Secondary | ICD-10-CM

## 2022-12-15 DIAGNOSIS — C089 Malignant neoplasm of major salivary gland, unspecified: Secondary | ICD-10-CM | POA: Diagnosis present

## 2022-12-15 DIAGNOSIS — J9811 Atelectasis: Secondary | ICD-10-CM | POA: Diagnosis present

## 2022-12-15 DIAGNOSIS — Z7982 Long term (current) use of aspirin: Secondary | ICD-10-CM | POA: Diagnosis not present

## 2022-12-15 DIAGNOSIS — K5100 Ulcerative (chronic) pancolitis without complications: Secondary | ICD-10-CM | POA: Diagnosis present

## 2022-12-15 DIAGNOSIS — J90 Pleural effusion, not elsewhere classified: Secondary | ICD-10-CM | POA: Diagnosis not present

## 2022-12-15 DIAGNOSIS — J910 Malignant pleural effusion: Secondary | ICD-10-CM | POA: Diagnosis present

## 2022-12-15 DIAGNOSIS — R432 Parageusia: Secondary | ICD-10-CM

## 2022-12-15 DIAGNOSIS — R0603 Acute respiratory distress: Principal | ICD-10-CM

## 2022-12-15 DIAGNOSIS — Z923 Personal history of irradiation: Secondary | ICD-10-CM

## 2022-12-15 DIAGNOSIS — I2782 Chronic pulmonary embolism: Secondary | ICD-10-CM | POA: Diagnosis not present

## 2022-12-15 DIAGNOSIS — I480 Paroxysmal atrial fibrillation: Secondary | ICD-10-CM | POA: Diagnosis present

## 2022-12-15 DIAGNOSIS — C7801 Secondary malignant neoplasm of right lung: Principal | ICD-10-CM | POA: Diagnosis present

## 2022-12-15 DIAGNOSIS — Z6833 Body mass index (BMI) 33.0-33.9, adult: Secondary | ICD-10-CM

## 2022-12-15 DIAGNOSIS — I2699 Other pulmonary embolism without acute cor pulmonale: Secondary | ICD-10-CM | POA: Diagnosis not present

## 2022-12-15 DIAGNOSIS — Z79890 Hormone replacement therapy (postmenopausal): Secondary | ICD-10-CM | POA: Diagnosis not present

## 2022-12-15 DIAGNOSIS — I110 Hypertensive heart disease with heart failure: Secondary | ICD-10-CM | POA: Diagnosis present

## 2022-12-15 DIAGNOSIS — I2693 Single subsegmental pulmonary embolism without acute cor pulmonale: Secondary | ICD-10-CM | POA: Diagnosis present

## 2022-12-15 DIAGNOSIS — R0602 Shortness of breath: Secondary | ICD-10-CM | POA: Diagnosis present

## 2022-12-15 DIAGNOSIS — Z87891 Personal history of nicotine dependence: Secondary | ICD-10-CM

## 2022-12-15 DIAGNOSIS — I509 Heart failure, unspecified: Secondary | ICD-10-CM

## 2022-12-15 DIAGNOSIS — Z860100 Personal history of colon polyps, unspecified: Secondary | ICD-10-CM | POA: Diagnosis not present

## 2022-12-15 DIAGNOSIS — Z7984 Long term (current) use of oral hypoglycemic drugs: Secondary | ICD-10-CM

## 2022-12-15 DIAGNOSIS — E1165 Type 2 diabetes mellitus with hyperglycemia: Secondary | ICD-10-CM | POA: Diagnosis present

## 2022-12-15 DIAGNOSIS — I10 Essential (primary) hypertension: Secondary | ICD-10-CM | POA: Diagnosis not present

## 2022-12-15 DIAGNOSIS — J189 Pneumonia, unspecified organism: Secondary | ICD-10-CM

## 2022-12-15 LAB — BASIC METABOLIC PANEL
Anion gap: 15 (ref 5–15)
BUN: 7 mg/dL — ABNORMAL LOW (ref 8–23)
CO2: 20 mmol/L — ABNORMAL LOW (ref 22–32)
Calcium: 8.8 mg/dL — ABNORMAL LOW (ref 8.9–10.3)
Chloride: 97 mmol/L — ABNORMAL LOW (ref 98–111)
Creatinine, Ser: 0.7 mg/dL (ref 0.61–1.24)
GFR, Estimated: >60 mL/min (ref >60)
Glucose, Bld: 95 mg/dL (ref 70–99)
Potassium: 3.7 mmol/L (ref 3.5–5.1)
Sodium: 132 mmol/L — ABNORMAL LOW (ref 135–145)

## 2022-12-15 LAB — CBC
HCT: 42.4 % (ref 39.0–52.0)
Hemoglobin: 13.7 g/dL (ref 13.0–17.0)
MCH: 28.2 pg (ref 26.0–34.0)
MCHC: 32.3 g/dL (ref 30.0–36.0)
MCV: 87.4 fL (ref 80.0–100.0)
Platelets: 171 10*3/uL (ref 150–400)
RBC: 4.85 MIL/uL (ref 4.22–5.81)
RDW: 15 % (ref 11.5–15.5)
WBC: 4.7 10*3/uL (ref 4.0–10.5)
nRBC: 0 % (ref 0.0–0.2)

## 2022-12-15 LAB — HEPARIN LEVEL (UNFRACTIONATED): Heparin Unfractionated: 0.73 [IU]/mL — ABNORMAL HIGH (ref 0.30–0.70)

## 2022-12-15 LAB — LACTIC ACID, PLASMA: Lactic Acid, Venous: 1.8 mmol/L (ref 0.5–1.9)

## 2022-12-15 LAB — HEMOGLOBIN A1C
Hgb A1c MFr Bld: 6.8 % — ABNORMAL HIGH (ref 4.8–5.6)
Mean Plasma Glucose: 148.46 mg/dL

## 2022-12-15 LAB — PROTIME-INR
INR: 1.3 — ABNORMAL HIGH (ref 0.8–1.2)
Prothrombin Time: 16.3 s — ABNORMAL HIGH (ref 11.4–15.2)

## 2022-12-15 LAB — GLUCOSE, CAPILLARY: Glucose-Capillary: 108 mg/dL — ABNORMAL HIGH (ref 70–99)

## 2022-12-15 LAB — APTT: aPTT: 37 s — ABNORMAL HIGH (ref 24–36)

## 2022-12-15 LAB — BRAIN NATRIURETIC PEPTIDE: B Natriuretic Peptide: 51.6 pg/mL (ref 0.0–100.0)

## 2022-12-15 LAB — TROPONIN I (HIGH SENSITIVITY): Troponin I (High Sensitivity): 7 ng/L (ref <18)

## 2022-12-15 MED ORDER — METOPROLOL TARTRATE 50 MG PO TABS
50.0000 mg | ORAL_TABLET | Freq: Two times a day (BID) | ORAL | Status: DC
  Administered 2022-12-16 – 2022-12-17 (×2): 50 mg via ORAL
  Filled 2022-12-15 (×3): qty 1

## 2022-12-15 MED ORDER — METRONIDAZOLE 500 MG/100ML IV SOLN
500.0000 mg | Freq: Two times a day (BID) | INTRAVENOUS | Status: DC
  Administered 2022-12-15 – 2022-12-17 (×4): 500 mg via INTRAVENOUS
  Filled 2022-12-15 (×5): qty 100

## 2022-12-15 MED ORDER — PREGABALIN 75 MG PO CAPS
150.0000 mg | ORAL_CAPSULE | Freq: Two times a day (BID) | ORAL | Status: DC
  Administered 2022-12-15 – 2022-12-17 (×4): 150 mg via ORAL
  Filled 2022-12-15 (×4): qty 2

## 2022-12-15 MED ORDER — IOHEXOL 350 MG/ML SOLN
100.0000 mL | Freq: Once | INTRAVENOUS | Status: AC | PRN
  Administered 2022-12-15: 100 mL via INTRAVENOUS

## 2022-12-15 MED ORDER — SODIUM CHLORIDE 0.9 % IV SOLN
2.0000 g | INTRAVENOUS | Status: DC
  Administered 2022-12-15 – 2022-12-16 (×2): 2 g via INTRAVENOUS
  Filled 2022-12-15 (×3): qty 20

## 2022-12-15 MED ORDER — BALSALAZIDE DISODIUM 750 MG PO CAPS: 750.0000 mg | ORAL_CAPSULE | Freq: Two times a day (BID) | ORAL | Status: DC

## 2022-12-15 MED ORDER — HEPARIN BOLUS VIA INFUSION
4000.0000 [IU] | Freq: Once | INTRAVENOUS | Status: AC
  Administered 2022-12-15: 4000 [IU] via INTRAVENOUS
  Filled 2022-12-15: qty 4000

## 2022-12-15 MED ORDER — SODIUM CHLORIDE 0.9 % IV SOLN
500.0000 mg | INTRAVENOUS | Status: DC
  Administered 2022-12-15: 500 mg via INTRAVENOUS
  Filled 2022-12-15: qty 5

## 2022-12-15 MED ORDER — ACETAMINOPHEN 650 MG RE SUPP: 650.0000 mg | Freq: Four times a day (QID) | RECTAL | Status: DC | PRN

## 2022-12-15 MED ORDER — SODIUM CHLORIDE 0.9% FLUSH
10.0000 mL | Freq: Two times a day (BID) | INTRAVENOUS | Status: DC
  Administered 2022-12-15 – 2022-12-16 (×3): 10 mL via INTRAVENOUS

## 2022-12-15 MED ORDER — DULOXETINE HCL 30 MG PO CPEP
60.0000 mg | ORAL_CAPSULE | Freq: Every day | ORAL | Status: DC
  Administered 2022-12-16 – 2022-12-17 (×2): 60 mg via ORAL
  Filled 2022-12-15 (×2): qty 2

## 2022-12-15 MED ORDER — ONDANSETRON HCL 4 MG/2ML IJ SOLN: 4.0000 mg | Freq: Four times a day (QID) | INTRAMUSCULAR | Status: DC | PRN

## 2022-12-15 MED ORDER — SIMVASTATIN 20 MG PO TABS
20.0000 mg | ORAL_TABLET | Freq: Every day | ORAL | Status: DC
  Administered 2022-12-15 – 2022-12-17 (×3): 20 mg via ORAL
  Filled 2022-12-15 (×3): qty 1

## 2022-12-15 MED ORDER — SODIUM CHLORIDE 0.9% FLUSH
3.0000 mL | Freq: Two times a day (BID) | INTRAVENOUS | Status: DC
  Administered 2022-12-16 – 2022-12-17 (×3): 3 mL via INTRAVENOUS

## 2022-12-15 MED ORDER — INSULIN ASPART 100 UNIT/ML IJ SOLN
0.0000 [IU] | Freq: Three times a day (TID) | INTRAMUSCULAR | Status: DC
  Administered 2022-12-16: 2 [IU] via SUBCUTANEOUS
  Administered 2022-12-16: 3 [IU] via SUBCUTANEOUS
  Filled 2022-12-15 (×2): qty 1

## 2022-12-15 MED ORDER — HEPARIN (PORCINE) 25000 UT/250ML-% IV SOLN
1600.0000 [IU]/h | INTRAVENOUS | Status: DC
  Administered 2022-12-15: 1600 [IU]/h via INTRAVENOUS
  Filled 2022-12-15: qty 250

## 2022-12-15 MED ORDER — ACETAMINOPHEN 325 MG PO TABS: 650.0000 mg | ORAL_TABLET | Freq: Four times a day (QID) | ORAL | Status: DC | PRN

## 2022-12-15 MED ORDER — SODIUM CHLORIDE 0.9 % IV BOLUS
500.0000 mL | Freq: Once | INTRAVENOUS | Status: AC
  Administered 2022-12-15: 500 mL via INTRAVENOUS

## 2022-12-15 MED ORDER — POLYETHYLENE GLYCOL 3350 17 G PO PACK
17.0000 g | PACK | Freq: Every day | ORAL | Status: DC | PRN
Start: 2022-12-15 — End: 2022-12-17

## 2022-12-15 MED ORDER — ASPIRIN 81 MG PO TBEC
81.0000 mg | DELAYED_RELEASE_TABLET | Freq: Every day | ORAL | Status: DC
  Administered 2022-12-16 – 2022-12-17 (×2): 81 mg via ORAL
  Filled 2022-12-15 (×2): qty 1

## 2022-12-15 MED ORDER — GADOBUTROL 1 MMOL/ML IV SOLN
10.0000 mL | Freq: Once | INTRAVENOUS | Status: AC | PRN
  Administered 2022-12-15: 10 mL via INTRAVENOUS

## 2022-12-15 MED ORDER — OXYCODONE HCL 5 MG PO TABS: 5.0000 mg | ORAL_TABLET | Freq: Four times a day (QID) | ORAL | Status: DC | PRN

## 2022-12-15 MED ORDER — ONDANSETRON HCL 4 MG PO TABS: 4.0000 mg | ORAL_TABLET | Freq: Four times a day (QID) | ORAL | Status: DC | PRN

## 2022-12-15 NOTE — Assessment & Plan Note (Signed)
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 History of CHF noted however BNP is within normal limits and no other evidence of hypervolemia. Possible atelectasis vs pneumonia noted in the dependent areas next to effusion, so will cover for now pending pleural fluid results.   - Consult pulmonology versus IR for thoracentesis - Will need pleural fluid cytology, LDH, total protein, cell count, culture, Gram stain - Telemetry monitoring - Supplemental oxygen as needed to maintain oxygen saturation above 88% - Continue ceftriaxone - Discontinue azithromycin - Start Flagyl for anaerobic coverage

## 2022-12-15 NOTE — H&P (Signed)
History and Physical    Patient: Erik Hendricks:[id number] DOB: 08-May-1940 DOA: 12/15/2022 DOS: the patient was seen and examined on 12/15/2022 PCP: [name], MD  Patient coming from: Home  Chief Complaint:  Chief Complaint  Patient presents with   Shortness of Breath   HPI: FERNADO Hendricks is a 82 y.o. 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   [name] states his symptoms began with a sudden lack of taste that has been persistent and unchanged since initial onset.  He went to his ENT who was not able to find any abnormalities and recommended an MRI of the brain.  With this, he began to lose his appetite and has experienced approximately 10 pound weight loss.   Over the past 1 week, he has been experiencing progressively worsening shortness of breath, with inability to walk more than a few steps at this time.  Due to this, he came to his PCP today, who sent him to the ED.  He denies any lower extremity swelling, abdominal distention, chest pain, palpitations.  He denies any shortness of breath at rest or orthopnea.  ED course: On arrival to the ED, patient was normotensive at 103/66 with heart rate of 101.  He was saturating at 91% on room air.  He was afebrile at 98.  Initial workup notable for normal CBC, sodium of 132, bicarb 20, creatinine 0.70 with GFR above 60.  BNP, troponin and lactic acid within normal limits.  CT of the head, neck, and chest were obtained; notable for a large right pleural effusion with associated atelectasis +/- pneumonia, large right upper lobe spiculated mass, and a small subsegmental PE.  Patient started on heparin, azithromycin and ceftriaxone.   TRH contacted for admission.  Review of Systems: As mentioned in the history of present illness. All other systems reviewed and are negative.  Past Medical History:  Diagnosis Date   A-fib [facility])    Anal fissure    Colon polyp    Hemorrhoid    Hypertension    Murmur    Rectal abscess    Past Surgical History:  Procedure Laterality Date   ANAL FISTULECTOMY  September 2013   Posterior subcutaneous fistula treated by fistulotomy.   COLONOSCOPY  2007, 2012   Dr Lemar Livings   COLONOSCOPY WITH PROPOFOL N/A 06/21/2018   Procedure: COLONOSCOPY WITH PROPOFOL;  Surgeon: Christena Deem, MD;  Location: [facility] ENDOSCOPY;  Service: Endoscopy;  Laterality: N/A;   FISSURECTOMY     40 yrs ago   KNEE SURGERY Left 2005   POLYPECTOMY  2007   RECTAL SURGERY  40 yrs ago   cyst   Social History:  reports that he has quit smoking. His smoking use included cigars. He has never used smokeless tobacco. He reports that he does not drink alcohol and does not use drugs.  Allergies  Allergen Reactions   Ciprofloxacin Nausea Only    Family History  Problem Relation Age of Onset   Heart Problems Father    Prostate cancer Neg Hx    Bladder Cancer Neg Hx     Prior to Admission medications   Medication Sig Start Date End Date Taking? Authorizing Provider  Alpha-Lipoic Acid 600 MG CAPS Take by mouth.    [provider]  apixaban (ELIQUIS) 5 MG TABS tablet Take by mouth. 08/06/18   [provider]  aspirin EC 81 MG tablet Take by mouth daily.     [provider]  balsalazide (COLAZAL) 750 MG capsule Take 750 mg by mouth 2 (two) times daily.    [provider]  Cholecalciferol (VITAMIN D-1000 MAX ST) 25 MCG (1000 UT) tablet Take by mouth.    [provider]  DULoxetine (CYMBALTA) 30 MG capsule Take by mouth daily. 04/24/18   [provider]  fluticasone (FLONASE) 50 MCG/ACT nasal spray Place 1 spray into both nostrils daily.    [provider]   gabapentin (NEURONTIN) 400 MG capsule gabapentin 400 mg capsule    [provider]  glipiZIDE (GLUCOTROL XL) 5 MG 24 hr tablet Take 1 tablet by mouth daily. 06/22/22   [provider]  Boris Lown Oil 350 MG CAPS Take 350 mg by mouth 2 (two) times daily.  Patient not taking: Reported on 08/28/2022    [provider]  losartan (COZAAR) 50 MG tablet TAKE 1 TABLET BY MOUTH EVERY DAY 03/21/18   [provider]  meloxicam (MOBIC) 7.5 MG tablet Take 1 tablet (7.5 mg total) by mouth daily. 04/18/18   Helane Gunther, DPM  Mesalamine 800 MG TBEC Take 800 mg by mouth 3 (three) times daily.  11/13/16   [provider]  metoprolol succinate (TOPROL-XL) 50 MG 24 hr tablet Take 50 mg by mouth 2 (two) times daily.     [provider]  metoprolol tartrate (LOPRESSOR) 50 MG tablet  05/02/18   [provider]  montelukast (SINGULAIR) 10 MG tablet Take by mouth. 04/03/17   [provider]  niacin (NIASPAN) 500 MG CR tablet Take 500 mg by mouth daily.     [provider]  pregabalin (LYRICA) 150 MG capsule Take 150 mg by mouth 2 (two) times daily. 04/18/18   [provider]  pregabalin (LYRICA) 50 MG capsule Take 150 mg by mouth 2 (two) times daily.     [provider]  simvastatin (ZOCOR) 20 MG tablet Take 20 mg by mouth daily.  02/01/18   [provider]  triamterene-hydrochlorothiazide (MAXZIDE-25) 37.5-25 MG tablet Take 1 tablet by mouth daily. 04/21/18   [provider]  valsartan (DIOVAN) 80 MG tablet valsartan 80 mg tablet    [provider]    Physical Exam: Vitals:   12/15/22 1337 12/15/22 1339 12/15/22 1700  BP: 103/66  123/71  Pulse: (!) 50  99  Resp: 20  18  Temp: 98 F (36.7 C)    SpO2: 91%  93%  Weight:  108.9 kg   Height:  6' (1.829 m)    Physical Exam Vitals and nursing note reviewed.  Constitutional:      General: He is not in acute distress.    Appearance: He is obese.  HENT:      Head: Normocephalic and atraumatic.     Mouth/Throat:     Mouth: Mucous membranes are moist.     Pharynx: Oropharynx is clear.     Comments: Uvula center, with no masses noted Eyes:     Extraocular Movements: Extraocular movements intact.     Pupils: Pupils are equal, round, and reactive to light.  Cardiovascular:     Rate and Rhythm: Tachycardia present. Rhythm irregular.  Pulmonary:     Effort: Tachypnea present. No accessory muscle usage.     Breath sounds: Decreased breath sounds (Up to the mid-upper  lung fields on the right only) present. No wheezing, rhonchi or rales.  Abdominal:     Palpations: Abdomen is soft.     Tenderness: There is no abdominal tenderness.  Musculoskeletal:     Cervical back: Neck supple.     Right lower leg: No edema.     Left lower leg: No edema.  Skin:    General: Skin is warm and dry.  Neurological:     General: No focal deficit present.     Mental Status: He is alert and oriented to person, place, and time.     Comments:  Patient is alert and oriented x 4.  Cranial nerves II through XII intact. 5 out of 5 strength throughout Sensation grossly intact throughout No tremor   Psychiatric:        Mood and Affect: Mood normal.        Behavior: Behavior normal.    Data Reviewed: CBC with WBC of 4.7, hemoglobin of 13.7, platelets of 171 BMP with sodium of 132, potassium 3.7, bicarb 20, glucose 95, creatinine 0.70 with BUN of 7 and creatinine above 60 BNP 51 Troponin 7 Lactic acid 1.8 INR 1.3  EKG personally reviewed.  Atrial fibrillation with rate of 107.  No ST or T wave changes consistent with acute ischemia.  CT Soft Tissue Neck W Contrast  Result Date: 12/15/2022 CLINICAL DATA:  Soft tissue infection suspected, neck, xray done h/o L parotid cancer, change of voice, touble swallowing. EXAM: CT NECK WITH CONTRAST TECHNIQUE: Multidetector CT imaging of the neck was performed using the standard protocol following the bolus administration  of intravenous contrast. RADIATION DOSE REDUCTION: This exam was performed according to the departmental dose-optimization program which includes automated exposure control, adjustment of the mA and/or kV according to patient size and/or use of iterative reconstruction technique. CONTRAST:  OMNIPAQUE IOHEXOL 350 MG/ML SOLN COMPARISON:  Neck CT 05/09/2018. FINDINGS: Pharynx and larynx: Normal. No mass or swelling.  Glottis is closed. Salivary glands: Prior left parotidectomy. Atrophy of the left submandibular gland, likely posttreatment. Normal right parotid and submandibular gland. Thyroid: Normal. Lymph nodes: Prior selective left neck dissection. No suspicious cervical lymphadenopathy. Vascular: Atherosclerotic calcifications of the carotid bulbs. Limited intracranial: Unremarkable. Visualized orbits: Unremarkable. Mastoids and visualized paranasal sinuses: Well aerated. Skeleton: Diffuse idiopathic skeletal hyperostosis with prominent anterior osteophytes at T2-3. Upper chest: Lobulated mass in the anterior right upper lobe segment with possible extrapleural extension and mediastinal invasion, measuring up to 4.1 x 3.6 cm (axial image 135 series 5), suspicious for malignancy. Moderate right pleural effusion. Other: None. IMPRESSION: 1. Lobulated mass in the anterior right upper lobe segment with possible extrapleural extension and mediastinal invasion, measuring up to 4.1 cm, suspicious for malignancy. Dedicated chest CT is recommended for further evaluation. 2. Moderate right pleural effusion. 3. Prior left parotidectomy and selective left neck dissection. No suspicious cervical lymphadenopathy. Electronically Signed   By: Orvan Falconer M.D.   On: 12/15/2022 16:28   CT Head Wo Contrast  Result Date: 12/15/2022 CLINICAL DATA:  Mental status change, unknown cause. EXAM: CT HEAD WITHOUT CONTRAST TECHNIQUE: Contiguous axial images were obtained from the base of the skull through the vertex without  intravenous contrast. RADIATION DOSE REDUCTION: This exam was performed according to the departmental dose-optimization program which includes automated exposure control, adjustment of the mA and/or kV according to patient size and/or use of iterative reconstruction technique. COMPARISON:  None Available. 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 Gray-white differentiation is  otherwise preserved. Mild patchy hypoattenuation of the periventricular white matter, most consistent with mild chronic small-vessel disease. No hydrocephalus or extra-axial collection. No mass effect or midline shift. Vascular: No hyperdense vessel or unexpected calcification. Skull: No calvarial fracture or suspicious bone lesion. Skull base is unremarkable. Sinuses/Orbits: No acute findings. Other: None. IMPRESSION: 1. 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 Consider MRI for further evaluation. 2. Mild chronic small-vessel disease. Electronically Signed   By: Orvan Falconer M.D.   On: 12/15/2022 16:16   CT Angio Chest Pulmonary Embolism (PE) W or WO Contrast  Result Date: 12/15/2022 CLINICAL DATA:  82 year old male with history of increasing shortness of breath. Evaluate for pulmonary embolism. EXAM: CT ANGIOGRAPHY CHEST WITH CONTRAST TECHNIQUE: Multidetector CT imaging of the chest was performed using the standard protocol during bolus administration of intravenous contrast. Multiplanar CT image reconstructions and MIPs were obtained to evaluate the vascular anatomy. RADIATION DOSE REDUCTION: This exam was performed according to the departmental dose-optimization program which includes automated exposure control, adjustment of the mA and/or kV according to patient size and/or use of iterative reconstruction technique. CONTRAST:  OMNIPAQUE IOHEXOL 350 MG/ML SOLN COMPARISON:  No priors. FINDINGS: Cardiovascular: Study is slightly limited by patient respiratory motion  and suboptimal contrast bolus. With these limitations in mind there is no definite central, lobar or segmental sized filling defect in the pulmonary arterial tree to indicate clinically significant pulmonary embolism. However, there does appear to be a subsegmental sized embolus to the left upper lobe (axial image 182 of series 6) which appears either occlusive or nearly completely occlusive. Heart size is normal. Small amount of pericardial fluid and/or thickening. No pericardial calcification. There is aortic atherosclerosis, as well as atherosclerosis of the great vessels of the mediastinum and the coronary arteries, including calcified atherosclerotic plaque in the left main, left anterior descending, left circumflex and right coronary arteries. Thickening and calcification of the aortic valve. Mediastinum/Nodes: No pathologically enlarged mediastinal or hilar lymph nodes. Esophagus is unremarkable in appearance. No axillary lymphadenopathy. Lungs/Pleura: Multiple nodular and mass-like areas of architectural distortion are noted in the anterior aspect of the right upper lobe, largest of which (axial image 87 of series 7) is estimated to measure approximately 4.4 x 3.6 cm and has macrolobulated and slightly spiculated margins, concerning for neoplasm. Dependent areas of atelectasis are also noted in the right lung. Consolidative changes are also noted in the base of the right lower lobe. Left lung appears clear. Large right pleural effusion lying dependently. Upper Abdomen: Visualized portions of the liver have a shrunken appearance and nodular contour, suggesting underlying cirrhosis. Musculoskeletal: There are no aggressive appearing lytic or blastic lesions noted in the visualized portions of the skeleton. Review of the MIP images confirms the above findings. IMPRESSION: 1. Study is positive for a small subsegmental sized embolus to the left upper lobe, as above. 2. Importantly, however, there are nodular and  mass-like regions in the right lung, most notable for a 4.4 x 3.6 cm mass in the anterior aspect of the right upper lobe concerning for potential neoplasm. Associated with this is a large right pleural effusion which may be malignant. Further clinical evaluation and consideration for follow-up PET-CT in the near future is strongly recommended to better evaluate these findings. 3. This right pleural effusion is associated with considerable areas of atelectasis in the right lung, as well as some consolidative changes in the basal segments of the right lower lobe concerning for pneumonia. 4. Aortic atherosclerosis, in addition  to left main and three-vessel coronary artery disease. Please note that although the presence of coronary artery calcium documents the presence of coronary artery disease, the severity of this disease and any potential stenosis cannot be assessed on this non-gated CT examination. Assessment for potential risk factor modification, dietary therapy or pharmacologic therapy may be warranted, if clinically indicated. 5. There are calcifications of the aortic valve. Echocardiographic correlation for evaluation of potential valvular dysfunction may be warranted if clinically indicated. Aortic Atherosclerosis (ICD10-I70.0). Electronically Signed   By: Trudie Reed M.D.   On: 12/15/2022 16:01   DG Chest 2 View  Result Date: 12/15/2022 CLINICAL DATA:  Shortness of breath. EXAM: CHEST - 2 VIEW COMPARISON:  None Available. FINDINGS: The heart size appears enlarged. There is obscuration of the right cardiac silhouette. Moderate-sized right pleural effusion with associated basilar atelectasis. Bilateral central perihilar prominence. No pneumothorax. No acute osseous abnormality. IMPRESSION: 1. Moderate-sized right pleural effusion with associated basilar atelectasis. 2. Patchy right middle and lower lung zone opacities may represent an infectious/inflammatory etiology. Electronically Signed   By:  Hart Robinsons M.D.   On: 12/15/2022 15:27    Results are pending, will review when available.  Assessment and Plan:  * Pleural effusion on right Patient presented to his PCPs office with gradually worsening shortness of breath found to have a large right pleural effusion with associated large RUL lung mass concerning for malignant effusion. History of CHF noted however BNP is within normal limits and no other evidence of hypervolemia. Possible atelectasis vs pneumonia noted in the dependent areas next to effusion, so will cover for now pending pleural fluid results.   - Consult pulmonology versus IR for thoracentesis - Will need pleural fluid cytology, LDH, total protein, cell count, culture, Gram stain - Telemetry monitoring - Supplemental oxygen as needed to maintain oxygen saturation above 88% - Continue ceftriaxone - Discontinue azithromycin - Start Flagyl for anaerobic coverage  Pulmonary embolism (HCC) Small subsegmental PE noted on CTA.  Given small size, this is unlikely the cause of patient's shortness of breath.  Likely an incidental finding.  He is on low-dose Eliquis 2.5 mg as he was unable to tolerate full dose.  May need to consider Lovenox prior to discharge  - Heparin infusion initiated in ED; continue for now via pharmacy dosing - Consider lower extremity Dopplers  Mass of upper lobe of right lung Large 4.4 x 3.6 centimeter mass in the right upper lobe that was not present on PET scan approximately 1 year ago.  High suspicion for malignancy.  Will discuss with pulmonology regarding setting up Mr. Colquhoun for outpatient bronchoscopy.  Ageusia Patient reports sudden onset ageusia that began approximately 2 weeks ago.  Given concern for primary lung malignancy with potential pleural metastasis, will order MRI.  - MRI of the brain with and without  Congestive heart failure (CHF) (HCC) History of HFpEF with last EF of 50% in March 2023.  BNP within normal limits.  No  evidence of hypervolemia on examination.  - Hold off on diuretics  Ulcerative pancolitis (HCC) - Continue home regimen  Type 2 diabetes mellitus with hyperglycemia, without long-term current use of insulin (HCC) Well-controlled with last A1c of 7.5%  - Hold home glipizide - SSI, moderate  Salivary gland carcinoma (HCC) Patient has a history of stage IVa salivary ductal carcinoma diagnosed in 2020.  Patient underwent left parotidectomy with neck dissection, adjuvant radiation therapy and then adjuvant Lupron.  PET scan in 2023 demonstrated no recurrence.  Suspect mass of the lung is primary rather than with metastatic  Hypertension - Hold for today given high risk of hypotension, can resume if blood pressure remains stable  Advance Care Planning:   Code Status: Full Code   Consults: Pulmonology  Family Communication: Patient's daughter and wife updated at bedside.   Severity of Illness: The appropriate patient status for this patient is INPATIENT. Inpatient status is judged to be reasonable and necessary in order to provide the required intensity of service to ensure the patient's safety. The patient's presenting symptoms, physical exam findings, and initial radiographic and laboratory data in the context of their chronic comorbidities is felt to place them at high risk for further clinical deterioration. Furthermore, it is not anticipated that the patient will be medically stable for discharge from the hospital within 2 midnights of admission.   * I certify that at the point of admission it is my clinical judgment that the patient will require inpatient hospital care spanning beyond 2 midnights from the point of admission due to high intensity of service, high risk for further deterioration and high frequency of surveillance required.*  Author: Verdene Lennert, MD 12/15/2022 6:00 PM  For on call review [email or web address].

## 2022-12-15 NOTE — Assessment & Plan Note (Signed)
Well-controlled with last A1c of 7.5%  - Hold home glipizide - SSI, moderate

## 2022-12-15 NOTE — Consult Note (Signed)
PHARMACY - ANTICOAGULATION CONSULT NOTE  Pharmacy Consult for IV Heparin Indication: pulmonary embolus  Patient Measurements: Height: 6' (182.9 cm) Weight: 108.9 kg (240 lb) IBW/kg (Calculated) : 77.6 Heparin Dosing Weight: 100.6 kg  Labs: Recent Labs    12/15/22 1337 12/15/22 1425 12/15/22 1530  HGB 13.7  --   --   HCT 42.4  --   --   PLT 171  --   --   APTT  --   --  37*  LABPROT  --   --  16.3*  INR  --   --  1.3*  CREATININE 0.70  --   --   TROPONINIHS  --  7  --     Estimated Creatinine Clearance: 93.9 mL/min (by C-G formula based on SCr of 0.7 mg/dL).  Medical History: Past Medical History:  Diagnosis Date   A-fib [facility])    Anal fissure    Colon polyp    Hemorrhoid    Hypertension    Murmur    Rectal abscess    Medications:  Medication reconciliation is pending. Appears patient is filling apixaban 2.5 mg BID prior to admission. Last dose of apixaban unknown  Assessment: Patient is an 82 y/o M with medical history as above and including Afib on apixaban here with shortness of breath and found to have lung mass and small subsegmental PE. Pharmacy consulted to initiate and manage heparin infusion for acute PE.  Baseline CBC within normal limits. Baseline aPTT slightly elevated to 1.3 and aPTT to 37s. Baseline anti-Xa level is pending  Goal of Therapy:  Heparin level 0.3-0.7 units/ml aPTT 66 - 102 seconds Monitor platelets by anticoagulation protocol: Yes   Plan:  --Heparin 4000 unit IV bolus followed by continuous infusion at 1600 units/hr --aPTT 8 hours after initiation of infusion. Plan to follow aPTT given anticipated interference of apixaban on anti-Xa level. Transition to anti-Xa monitoring only when correlation established --Daily CBC per protocol while on IV heparin  Tressie Ellis 12/15/2022,4:37 PM

## 2022-12-15 NOTE — Assessment & Plan Note (Signed)
-   Continue home regimen 

## 2022-12-15 NOTE — Progress Notes (Signed)
Elink following for sepsis protocol. 

## 2022-12-15 NOTE — Assessment & Plan Note (Signed)
Patient has a history of stage IVa salivary ductal carcinoma diagnosed in 2020.  Patient underwent left parotidectomy with neck dissection, adjuvant radiation therapy and then adjuvant Lupron.  PET scan in 2023 demonstrated no recurrence.  Suspect mass of the lung is primary rather than with metastatic

## 2022-12-15 NOTE — Assessment & Plan Note (Signed)
Large 4.4 x 3.6 centimeter mass in the right upper lobe that was not present on PET scan on 03/09/2021.  Will see if cytology from the pleural fluid will give [place] a diagnosis if not may end up needing a biopsy.

## 2022-12-15 NOTE — Assessment & Plan Note (Signed)
Patient reports sudden onset ageusia that began approximately 2 weeks ago.  Given concern for primary lung malignancy with potential pleural metastasis, will order MRI.  - MRI of the brain with and without

## 2022-12-15 NOTE — ED Triage Notes (Addendum)
Pt comes with c/o increased sob, loss of taste and dec intake. Pt now having more sob and feeling like he could pass out. Pt called 911 and EMS came but pt had family bring him here instead.   Pt states he couldn't get his breath. Pt states he hasn't eaten in 3 days. Pt appears very pale. Pt denies any bleeding or any kind. Family does think pt looks pale too.   Pt denies any cough,congestion or fevers. Pt was tested twice for covid and both neg. Last test was on Saturday. Pt does sound hoarse. Pt denies any sore throat.

## 2022-12-15 NOTE — ED Provider Notes (Signed)
Care of this patient assumed from prior physician at 1530 pending CTs, reevaluation, and disposition. Please see prior physician note for further details.  Briefly, this is a 82 year old male who presented with worsening shortness of breath.  Vital signs stable on presentation.  Labs without critical derangements including normal lactate and white blood cell count. Chest x-Mikle Sternberg with pleural effusion as well as right sided opacities.  CT head, CTA chest, CT soft tissue neck ordered, results pending at time of signout.  CT head with age-indeterminate infarct in the right basal ganglia, otherwise chronic disease noted.  CT soft tissue neck demonstrates prior surgical site, partial visualization of right sided chest mass.  CT of the chest demonstrates right upper lobe mass with associated pleural effusion concerning for malignancy.  Small PE also noted.  Consolidative changes also noted concerning for pneumonia.  Patient updated on results of his CT including multiple possible etiologies of his shortness of breath including his PE, pneumonia, lung mass, pleural effusion.  Do think he is appropriate for admission for further evaluation.  Patient is comfortable with this plan.  Will reach out to hospitalist team.  Reviewed with hospitalist team.  They will evaluate the patient for anticipated admission.   Trinna Post, MD 12/15/22 [phone]

## 2022-12-15 NOTE — ED Provider Notes (Signed)
[facility] Provider Note    Event Date/Time   First MD Initiated Contact with Patient 12/15/22 1353     (approximate)   History   Shortness of Breath   HPI  Erik Hendricks is a 82 y.o. 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  Patient states that he has had progressively worsening shortness of breath over the past 2 to 3 weeks.  States that he is now no longer to walk across the room or even take a couple of steps without getting significantly short of breath.  Denies any significant chest pain.  Denies nausea, vomiting or diaphoresis.  States that over the past couple of weeks he has had decreased p.o. intake because he is lost his taste.  Also complaining of a very dry mouth which were the initial symptoms of his parotid tumor.  Denies any history of DVT or PE.  Endorses compliance with his medication.  Denies any melena or blood in his stool.  Cardiac catheterization in 2002 -70% stenosis OM1, occluded RCA Echocardiogram reported 04/2021 with EF of 50% and mild tricuspid regurg.     Physical Exam   Triage Vital Signs: ED Triage Vitals  Encounter Vitals Group     BP 12/15/22 1337 103/66     Systolic BP Percentile --      Diastolic BP Percentile --      Pulse Rate 12/15/22 1337 (!) 50     Resp 12/15/22 1337 20     Temp 12/15/22 1337 98 F (36.7 C)     Temp src --      SpO2 12/15/22 1337 91 %     Weight 12/15/22 1339 240 lb (108.9 kg)     Height 12/15/22 1339 6' (1.829 m)     Head Circumference --      Peak Flow --      Pain Score 12/15/22 1336 0     Pain Loc --      Pain Education --      Exclude from Growth Chart --     Most recent vital signs: Vitals:   12/15/22 1337  BP: 103/66  Pulse: (!) 50  Resp: 20  Temp: 98 F (36.7 C)  SpO2: 91%     Physical Exam Constitutional:      Appearance: He is well-developed. He is obese.  HENT:     Head: Atraumatic.  Eyes:     Conjunctiva/sclera: Conjunctivae normal.  Cardiovascular:     Rate and Rhythm: Regular rhythm.  Pulmonary:     Effort: Respiratory distress present.     Breath sounds: Decreased breath sounds (R sided) present.  Chest:     Chest wall: No tenderness.  Musculoskeletal:     Cervical back: Normal range of motion.     Right lower leg: No edema.     Left lower leg: No edema.     Comments: No unilateral leg swelling  Skin:    General: Skin is warm.  Neurological:     Mental Status: He is alert. Mental status is at baseline.     IMPRESSION / MDM / ASSESSMENT AND PLAN / ED COURSE  I reviewed the triage vital signs and the nursing notes.  Differential diagnosis including new onset heart failure, pulmonary embolism, pneumonia, ACS, anemia, GI bleed, malignancy  EKG  I, Corena Herter,  the attending physician, personally viewed and interpreted this ECG.  Atrial fibrillation with a rate of 107.  Significant artifact.  Nonspecific ST changes.  No prolonged QTc.  Atrial fibrillation while on cardiac telemetry.  RADIOLOGY I independently reviewed imaging, my interpretation of imaging: Chest x-ray with concern for pneumonia and a large pleural effusion to the right side.  CTA with concern for large pleural effusion with questionable underlying pneumonia.  No obvious central pulmonary embolism on my evaluation.  LABS (all labs ordered are listed, but only abnormal results are displayed) Labs interpreted as -    Labs Reviewed  BASIC METABOLIC PANEL - Abnormal; Notable for the following components:      Result Value   Sodium 132 (*)    Chloride 97 (*)    CO2 20 (*)    BUN 7 (*)    Calcium 8.8 (*)    All other components within normal limits  PROTIME-INR - Abnormal; Notable for the following components:   Prothrombin Time 16.3 (*)    INR 1.3 (*)    All  other components within normal limits  APTT - Abnormal; Notable for the following components:   aPTT 37 (*)    All other components within normal limits  CULTURE, BLOOD (SINGLE)  CULTURE, BLOOD (SINGLE)  CBC  BRAIN NATRIURETIC PEPTIDE  LACTIC ACID, PLASMA  TROPONIN I (HIGH SENSITIVITY)     MDM  Blood cultures obtained.  Felt that 30 cc/kg of IV fluids may be detrimental given the concern for possible heart failure, given 500 bolus and will reevaluate.  Started on antibiotics to cover for community-acquired pneumonia.  CT scan with concern for effusion versus pneumonia, possible complex empyema.  Waiting for formal read.  Plan for admission.     PROCEDURES:  Critical Care performed: yes  .Critical Care  Performed by: Corena Herter, MD Authorized by: Corena Herter, MD   Critical care provider statement:    Critical care time (minutes):  30   Critical care was necessary to treat or prevent imminent or life-threatening deterioration of the following conditions:  Sepsis and respiratory failure   Critical care was time spent personally by me on the following activities:  Development of treatment plan with patient or surrogate, discussions with consultants, evaluation of patient's response to treatment, examination of patient, ordering and review of laboratory studies, ordering and review of radiographic studies, ordering and performing treatments and interventions, pulse oximetry, re-evaluation of patient's condition and review of old charts   Patient's presentation is most consistent with acute presentation with potential threat to life or bodily function.   MEDICATIONS ORDERED IN ED: Medications  sodium chloride flush (NS) 0.9 % injection 10 mL (has no administration in time range)  cefTRIAXone (ROCEPHIN) 2 g in sodium chloride 0.9 % 100 mL IVPB (2 g Intravenous New Bag/Given 12/15/22 1542)  azithromycin (ZITHROMAX) 500 mg in sodium chloride 0.9 % 250 mL IVPB (500 mg  Intravenous New Bag/Given 12/15/22 1540)  sodium chloride 0.9 % bolus 500 mL (0 mLs Intravenous Stopped 12/15/22 1529)  iohexol (OMNIPAQUE) 350 MG/ML injection 100 mL (100 mLs Intravenous Contrast Given 12/15/22 1457)    FINAL CLINICAL IMPRESSION(S) / ED DIAGNOSES   Final diagnoses:  Respiratory distress  Community acquired pneumonia of right lung, unspecified part of lung  Shortness of breath     Rx / DC Orders   ED Discharge Orders     None        Note:  This document was prepared using Dragon  voice recognition software and may include unintentional dictation errors.   Corena Herter, MD 12/15/22 [phone]

## 2022-12-15 NOTE — Assessment & Plan Note (Addendum)
Ultrasound lower extremities negative for DVT.  Switch Eliquis to 10 mg twice a day for 1 week then 5 mg twice a day afterwards.

## 2022-12-15 NOTE — Progress Notes (Signed)
CODE SEPSIS - PHARMACY COMMUNICATION  **Broad Spectrum Antibiotics should be administered within 1 hour of Sepsis diagnosis**  Time Code Sepsis Called/Page Received: 1523  Antibiotics Ordered: Ceftriaxone & azithromycin  Time of 1st antibiotic administration: 1540  Additional action taken by pharmacy: N/A  Tressie Ellis 12/15/2022  3:23 PM

## 2022-12-15 NOTE — Assessment & Plan Note (Addendum)
On metoprolol.  Held Cozaar this morning with blood pressure being a little on the lower side.

## 2022-12-16 ENCOUNTER — Inpatient Hospital Stay: Payer: 59

## 2022-12-16 DIAGNOSIS — K5100 Ulcerative (chronic) pancolitis without complications: Secondary | ICD-10-CM

## 2022-12-16 DIAGNOSIS — E876 Hypokalemia: Secondary | ICD-10-CM | POA: Insufficient documentation

## 2022-12-16 DIAGNOSIS — C089 Malignant neoplasm of major salivary gland, unspecified: Secondary | ICD-10-CM

## 2022-12-16 DIAGNOSIS — E1165 Type 2 diabetes mellitus with hyperglycemia: Secondary | ICD-10-CM

## 2022-12-16 DIAGNOSIS — E871 Hypo-osmolality and hyponatremia: Secondary | ICD-10-CM | POA: Insufficient documentation

## 2022-12-16 DIAGNOSIS — I10 Essential (primary) hypertension: Secondary | ICD-10-CM

## 2022-12-16 DIAGNOSIS — E669 Obesity, unspecified: Secondary | ICD-10-CM | POA: Insufficient documentation

## 2022-12-16 DIAGNOSIS — I5032 Chronic diastolic (congestive) heart failure: Secondary | ICD-10-CM | POA: Insufficient documentation

## 2022-12-16 DIAGNOSIS — R918 Other nonspecific abnormal finding of lung field: Secondary | ICD-10-CM | POA: Diagnosis not present

## 2022-12-16 DIAGNOSIS — J90 Pleural effusion, not elsewhere classified: Secondary | ICD-10-CM

## 2022-12-16 DIAGNOSIS — I2782 Chronic pulmonary embolism: Secondary | ICD-10-CM | POA: Diagnosis not present

## 2022-12-16 DIAGNOSIS — R432 Parageusia: Secondary | ICD-10-CM | POA: Diagnosis not present

## 2022-12-16 LAB — HEPARIN LEVEL (UNFRACTIONATED): Heparin Unfractionated: >1.1 [IU]/mL — ABNORMAL HIGH (ref 0.30–0.70)

## 2022-12-16 LAB — BASIC METABOLIC PANEL
Anion gap: 10 (ref 5–15)
BUN: 7 mg/dL — ABNORMAL LOW (ref 8–23)
CO2: 24 mmol/L (ref 22–32)
Calcium: 8.4 mg/dL — ABNORMAL LOW (ref 8.9–10.3)
Chloride: 96 mmol/L — ABNORMAL LOW (ref 98–111)
Creatinine, Ser: 0.66 mg/dL (ref 0.61–1.24)
GFR, Estimated: >60 mL/min (ref >60)
Glucose, Bld: 100 mg/dL — ABNORMAL HIGH (ref 70–99)
Potassium: 3.4 mmol/L — ABNORMAL LOW (ref 3.5–5.1)
Sodium: 130 mmol/L — ABNORMAL LOW (ref 135–145)

## 2022-12-16 LAB — CBC
HCT: 37.9 % — ABNORMAL LOW (ref 39.0–52.0)
Hemoglobin: 12.3 g/dL — ABNORMAL LOW (ref 13.0–17.0)
MCH: 28.1 pg (ref 26.0–34.0)
MCHC: 32.5 g/dL (ref 30.0–36.0)
MCV: 86.7 fL (ref 80.0–100.0)
Platelets: 140 10*3/uL — ABNORMAL LOW (ref 150–400)
RBC: 4.37 MIL/uL (ref 4.22–5.81)
RDW: 14.8 % (ref 11.5–15.5)
WBC: 4.3 10*3/uL (ref 4.0–10.5)
nRBC: 0 % (ref 0.0–0.2)

## 2022-12-16 LAB — BODY FLUID CELL COUNT WITH DIFFERENTIAL
Eos, Fluid: 0 %
Lymphs, Fluid: 72 %
Monocyte-Macrophage-Serous Fluid: 21 %
Neutrophil Count, Fluid: 7 %
Total Nucleated Cell Count, Fluid: 449 uL

## 2022-12-16 LAB — ALBUMIN, PLEURAL OR PERITONEAL FLUID: Albumin, Fluid: 2 g/dL

## 2022-12-16 LAB — C DIFFICILE QUICK SCREEN W PCR REFLEX
C Diff antigen: NEGATIVE
C Diff interpretation: NOT DETECTED
C Diff toxin: NEGATIVE

## 2022-12-16 LAB — PROTEIN, PLEURAL OR PERITONEAL FLUID: Total protein, fluid: <3 g/dL

## 2022-12-16 LAB — GLUCOSE, CAPILLARY
Glucose-Capillary: 132 mg/dL — ABNORMAL HIGH (ref 70–99)
Glucose-Capillary: 119 mg/dL — ABNORMAL HIGH (ref 70–99)
Glucose-Capillary: 151 mg/dL — ABNORMAL HIGH (ref 70–99)
Glucose-Capillary: 203 mg/dL — ABNORMAL HIGH (ref 70–99)

## 2022-12-16 LAB — GLUCOSE, PLEURAL OR PERITONEAL FLUID: Glucose, Fluid: 103 mg/dL

## 2022-12-16 LAB — AMYLASE, PLEURAL OR PERITONEAL FLUID: Amylase, Fluid: 25 U/L

## 2022-12-16 LAB — LACTATE DEHYDROGENASE, PLEURAL OR PERITONEAL FLUID: LD, Fluid: 185 U/L — ABNORMAL HIGH (ref 3–23)

## 2022-12-16 LAB — APTT
aPTT: >200 s (ref 24–36)
aPTT: 140 s — ABNORMAL HIGH (ref 24–36)

## 2022-12-16 MED ORDER — POTASSIUM CHLORIDE CRYS ER 20 MEQ PO TBCR
20.0000 meq | EXTENDED_RELEASE_TABLET | Freq: Once | ORAL | Status: AC
  Administered 2022-12-16: 20 meq via ORAL
  Filled 2022-12-16: qty 1

## 2022-12-16 MED ORDER — HEPARIN (PORCINE) 25000 UT/250ML-% IV SOLN: 1250.0000 [IU]/h | INTRAVENOUS | Status: DC

## 2022-12-16 MED ORDER — HEPARIN (PORCINE) 25000 UT/250ML-% IV SOLN: 1200.0000 [IU]/h | INTRAVENOUS | Status: DC

## 2022-12-16 MED ORDER — HEPARIN (PORCINE) 25000 UT/250ML-% IV SOLN
1400.0000 [IU]/h | INTRAVENOUS | Status: DC
  Administered 2022-12-16: 1400 [IU]/h via INTRAVENOUS
  Filled 2022-12-16: qty 250

## 2022-12-16 MED ORDER — ENSURE ENLIVE PO LIQD
237.0000 mL | Freq: Three times a day (TID) | ORAL | Status: DC
  Administered 2022-12-16 – 2022-12-17 (×4): 237 mL via ORAL

## 2022-12-16 MED ORDER — LOSARTAN POTASSIUM 50 MG PO TABS
50.0000 mg | ORAL_TABLET | Freq: Every day | ORAL | Status: DC
  Administered 2022-12-16: 50 mg via ORAL
  Filled 2022-12-16: qty 1

## 2022-12-16 MED ORDER — HEPARIN (PORCINE) 25000 UT/250ML-% IV SOLN
1400.0000 [IU]/h | INTRAVENOUS | Status: DC
  Administered 2022-12-16: 1400 [IU]/h via INTRAVENOUS

## 2022-12-16 NOTE — Progress Notes (Signed)
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  He presented to the emergency room with shortness of breath.  Poor appetite secondary to loss of taste.  Over the past week progressively short of breath.  In the ER, CT scan of the head neck and chest were obtained showing large right pleural effusion and right upper lobe mass and sub-segmental pulmonary embolism.  10/26.  Thoracentesis by pulmonary drew off 1.7 L of fluid.  Ultrasound lower extremities ordered.  Assessment and Plan: * Pleural effusion on right 1.7 L drawn off with thoracentesis on 10/26.  Cytology sent off.  Pulmonary embolism (HCC) Patient on heparin drip.  Will switch back to high-dose Eliquis hopefully tomorrow.  Ultrasound lower extremities to rule out DVT.  Mass of upper lobe of right lung Large 4.4 x 3.6 centimeter mass in the right upper lobe that was not present on PET scan on 03/09/2021.  Will see if cytology from the pleural fluid will give [place] a diagnosis if not may end up needing a biopsy.  [place] MRI of the brain negative for metastases.  Hypokalemia Replace potassium once  Hyponatremia Discontinue Maxide  Obesity (BMI 30-39.9) BMI 33.28  Chronic diastolic CHF (congestive heart failure) (HCC) No signs of heart failure.  Last EF 50%.  Ulcerative pancolitis (HCC) - Continue home regimen  Type 2 diabetes mellitus with hyperglycemia, without long-term current use of insulin (HCC) Well-controlled with last A1c of 7.5%  - Hold home glipizide - SSI, moderate  Salivary gland carcinoma (HCC) Patient has a history of stage IVa salivary ductal carcinoma diagnosed in 2020.   Patient underwent left parotidectomy with neck dissection, adjuvant radiation therapy and then adjuvant Lupron.  PET scan in 2023 demonstrated no recurrence.  Suspect mass of the lung is primary rather than with metastatic  Hypertension On metoprolol and will restart Cozaar.        Subjective: Patient coming in with shortness of breath.  Okay at rest but when he walks to the bathroom he is really short of breath.  Found to have a large right pleural effusion and lung mass and pulmonary embolism.  Physical Exam: Vitals:   12/16/22 0428 12/16/22 0500 12/16/22 0845 12/16/22 1200  BP: 118/65  (!) 150/82 (!) 153/95  Pulse: 92  93 98  Resp: 18  20 20   Temp: 97.9 F (36.6 C)  97.6 F (36.4 C) 98.6 F (37 C)  TempSrc: Oral  Oral Oral  SpO2: 92%  92% 91%  Weight:  111.3 kg    Height:       Physical Exam HENT:     Head: Normocephalic.  Eyes:     General: Lids are normal.     Conjunctiva/sclera: Conjunctivae normal.  Cardiovascular:     Rate and Rhythm: Normal rate and regular rhythm.     Heart sounds: Normal heart sounds, S1 normal and S2 normal.  Pulmonary:     Breath sounds: Examination of the right-middle field reveals decreased breath sounds and rhonchi. Examination of the right-lower field reveals decreased breath sounds and rhonchi. Examination of the left-lower field reveals decreased breath sounds. Decreased breath sounds and rhonchi present. No rales.  Abdominal:     Palpations: Abdomen is soft.  Tenderness: There is no abdominal tenderness.  Musculoskeletal:     Right lower leg: No swelling.     Left lower leg: No swelling.  Skin:    General: Skin is warm.     Findings: No rash.  Neurological:     Mental Status: He is alert and oriented to person, place, and time.     Data Reviewed: White blood cell count 4.3, hemoglobin 12.3, platelet count 140, creatinine 0.66, sodium 130, potassium 3.4  Family Communication: Wife at bedside  Disposition: Status is:  Inpatient Remains inpatient appropriate because: Had large-volume thoracentesis done today.  Will get ultrasound of the lower extremities.  Planned Discharge Destination: Home    Time spent: 28 minutes  Author: Alford Highland, MD 12/16/2022 3:20 PM  For on call review [email or web address].

## 2022-12-16 NOTE — Consult Note (Signed)
PHARMACY - ANTICOAGULATION CONSULT NOTE  Pharmacy Consult for IV Heparin Indication: pulmonary embolus  Patient Measurements: Height: 6' (182.9 cm) Weight: 108.9 kg (240 lb) IBW/kg (Calculated) : 77.6 Heparin Dosing Weight: 100.6 kg  Labs: Recent Labs    12/15/22 1337 12/15/22 1425 12/15/22 1530 12/15/22 1710 12/16/22 0138  HGB 13.7  --   --   --  12.3*  HCT 42.4  --   --   --  37.9*  PLT 171  --   --   --  140*  APTT  --   --  37*  --   --   LABPROT  --   --  16.3*  --   --   INR  --   --  1.3*  --   --   HEPARINUNFRC  --   --   --  0.73*  --   CREATININE 0.70  --   --   --  0.66  TROPONINIHS  --  7  --   --   --     Estimated Creatinine Clearance: 93.9 mL/min (by C-G formula based on SCr of 0.66 mg/dL).  Medical History: Past Medical History:  Diagnosis Date   A-fib [facility])    Anal fissure    Colon polyp    Hemorrhoid    Hypertension    Murmur    Rectal abscess    Medications:  Medication reconciliation is pending. Appears patient is filling apixaban 2.5 mg BID prior to admission. Last dose of apixaban unknown  Assessment: Patient is an 82 y/o M with medical history as above and including Afib on apixaban here with shortness of breath and found to have lung mass and small subsegmental PE. Pharmacy consulted to initiate and manage heparin infusion for acute PE.  Baseline CBC within normal limits. Baseline aPTT slightly elevated to 1.3 and aPTT to 37s. Baseline anti-Xa level is pending  Goal of Therapy:  Heparin level 0.3-0.7 units/ml aPTT 66 - 102 seconds Monitor platelets by anticoagulation protocol: Yes   Plan:  10/26 @ 0138:  aPTT = > 160,   HL = 1.44 - aPTT greatly elevated,  HL elevated from Eliquis PTA - ordinarily would hold heparin gtt for 1 hr and restart @ 0500 at lower rate of 1250 units/hr.  HOWEVER, the heparin gtt is set to d/c @ 0600 in preparation for thoracentesis @ 1200.  Therefore will just d/c heparin @ 0400.  Can resume heparin at 1250  units/hr after procedure.  - will recheck HL on 10/27 with AM labs  Transition to anti-Xa monitoring only when correlation established --Daily CBC per protocol while on IV heparin  Jem Castro D 12/16/2022,4:03 AM

## 2022-12-16 NOTE — Progress Notes (Signed)
Pharmacist notified this RN of elevated aPTT around 0350, with orders to discontinue the heparin gtt at 0400.

## 2022-12-16 NOTE — Plan of Care (Signed)
  Problem: Education: Goal: Knowledge of General Education information will improve Description: Including pain rating scale, medication(s)/side effects and non-pharmacologic comfort measures Outcome: Progressing  Pt had bedside right thoracentesis performed by Dr Larinda Buttery this shift Problem: Health Behavior/Discharge Planning: Goal: Ability to manage health-related needs will improve Outcome: Progressing   Problem: Clinical Measurements: Goal: Ability to maintain clinical measurements within normal limits will improve Outcome: Progressing Goal: Will remain free from infection Outcome: Progressing Goal: Diagnostic test results will improve Outcome: Progressing Goal: Respiratory complications will improve Outcome: Progressing Goal: Cardiovascular complication will be avoided Outcome: Progressing   Problem: Activity: Goal: Risk for activity intolerance will decrease Outcome: Progressing   Problem: Coping: Goal: Level of anxiety will decrease Outcome: Progressing   Problem: Elimination: Goal: Will not experience complications related to bowel motility Outcome: Progressing Goal: Will not experience complications related to urinary retention Outcome: Progressing   Problem: Pain Management: Goal: General experience of comfort will improve Outcome: Progressing   Problem: Safety: Goal: Ability to remain free from injury will improve Outcome: Progressing   Problem: Skin Integrity: Goal: Risk for impaired skin integrity will decrease Outcome: Progressing   Problem: Education: Goal: Ability to describe self-care measures that may prevent or decrease complications (Diabetes Survival Skills Education) will improve Outcome: Progressing Goal: Individualized Educational Video(s) Outcome: Progressing   Problem: Coping: Goal: Ability to adjust to condition or change in health will improve Outcome: Progressing   Problem: Fluid Volume: Goal: Ability to maintain a balanced intake  and output will improve Outcome: Progressing   Problem: Health Behavior/Discharge Planning: Goal: Ability to identify and utilize available resources and services will improve Outcome: Progressing Goal: Ability to manage health-related needs will improve Outcome: Progressing   Problem: Metabolic: Goal: Ability to maintain appropriate glucose levels will improve Outcome: Progressing   Problem: Nutritional: Goal: Maintenance of adequate nutrition will improve Outcome: Progressing Goal: Progress toward achieving an optimal weight will improve Outcome: Progressing   Problem: Skin Integrity: Goal: Risk for impaired skin integrity will decrease Outcome: Progressing   Problem: Tissue Perfusion: Goal: Adequacy of tissue perfusion will improve Outcome: Progressing   Problem: Nutrition: Goal: Adequate nutrition will be maintained Outcome: Not Progressing  Likes chocolate Ensure Plus

## 2022-12-16 NOTE — Assessment & Plan Note (Signed)
BMI 32.44

## 2022-12-16 NOTE — Progress Notes (Signed)
During change of shift report with Garrison Columbus RN he indicated that the pt was scheduled for a thoracentesis by IR at 1200 today, the pt's Heparin drip was previously stopped at 0400; no orders in [facility] for an U/S Guided Thoracentesis; telephone call to the [facility] U/S Dept at 1128 to verify schedule; the pt is not on their schedule, if this is to be done by the U/S dept the IR on call will have to be called in due to the weekend schedule; Dr. Renae Gloss previously ordered a Pulmonary Consult at 0825 in [facility] (Dr Larinda Buttery on call); advised Dr Renae Gloss via secure chat of the above said information; Dr Renae Gloss advised that the Pulmonologist will order appropriately for the pt; information passed on to the [facility] pharmacist, Hedwig Morton. [facility] due to the Heparin drip

## 2022-12-16 NOTE — Hospital Course (Signed)
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  He presented to the emergency room with shortness of breath.  Poor appetite secondary to loss of taste.  Over the past week progressively short of breath.  In the ER, CT scan of the head neck and chest were obtained showing large right pleural effusion and right upper lobe mass and sub-segmental pulmonary embolism.  10/26.  Thoracentesis by pulmonary drew off 1.7 L of fluid.  Ultrasound lower extremities ordered.

## 2022-12-16 NOTE — Consult Note (Signed)
PHARMACY - ANTICOAGULATION CONSULT NOTE  Pharmacy Consult for IV Heparin Indication: pulmonary embolus  Patient Measurements: Height: 6' (182.9 cm) Weight: 111.3 kg (245 lb 6 oz) IBW/kg (Calculated) : 77.6 Heparin Dosing Weight: 100.6 kg  Labs: Recent Labs    12/15/22 1337 12/15/22 1425 12/15/22 1530 12/15/22 1710 12/16/22 0138 12/16/22 2204  HGB 13.7  --   --   --  12.3*  --   HCT 42.4  --   --   --  37.9*  --   PLT 171  --   --   --  140*  --   APTT  --   --  37*  --  >200* 140*  LABPROT  --   --  16.3*  --   --   --   INR  --   --  1.3*  --   --   --   HEPARINUNFRC  --   --   --  0.73* >1.10*  --   CREATININE 0.70  --   --   --  0.66  --   TROPONINIHS  --  7  --   --   --   --     Estimated Creatinine Clearance: 94.9 mL/min (by C-G formula based on SCr of 0.66 mg/dL).  Medical History: Past Medical History:  Diagnosis Date   A-fib [facility])    Anal fissure    Colon polyp    Hemorrhoid    Hypertension    Murmur    Rectal abscess    Medications:  Medication reconciliation is pending. Appears patient is filling apixaban 2.5 mg BID prior to admission. Last dose of apixaban unknown  Assessment: Patient is an 82 y/o M with medical history as above and including Afib on apixaban here with shortness of breath and found to have lung mass and small subsegmental PE. Pharmacy consulted to initiate and manage heparin infusion for acute PE.  Baseline CBC within normal limits. Baseline aPTT slightly elevated to 1.3 and aPTT to 37s. Baseline anti-Xa level is pending  10/26 2204  aPTT 140 sec    supratherapeutic  Goal of Therapy:  Heparin level 0.3-0.7 units/ml aPTT 66 - 102 seconds Monitor platelets by anticoagulation protocol: Yes   Plan:  S/p thoracentesis. APTT is supratherapeutic, will hold Heparin infusion for 1 hour, then restart heparin infusion at 1200 units/hr. Decrease from 1400 units/hr due to elevated aPTT. Heparin level is falsely elevated due to apixaban.  Check aPTT in 8 hours. CBC and heparin level daily while on heparin. Switch to heparin level monitoring once aPTT and heparin level correlate.   Clovia Cuff, PharmD, BCPS 12/16/2022 11:12 PM

## 2022-12-16 NOTE — Assessment & Plan Note (Signed)
Discontinue Maxide.  Sodium 130 upon discharge.

## 2022-12-16 NOTE — Progress Notes (Signed)
Pt moved via recliner from Room 121a (semi-private room) to Room 127 (private room) due to current order in [facility] for enteric precautions; pt tolerated well with no complaints at this time

## 2022-12-16 NOTE — Procedures (Signed)
Thoracentesis  Procedure Note  WIKTOR GREENAWALT  [id number]  Oct 03, 1940  Date:12/16/22  Time:1:15 PM   Provider Performing:Jean-Pierre Kevin Space   Procedure: Thoracentesis with imaging guidance (29528)  Indication(s) Pleural Effusion  Consent Risks of the procedure as well as the alternatives and risks of each were explained to the patient and/or caregiver.  Consent for the procedure was obtained and is signed in the bedside chart  Anesthesia Topical only with 1% lidocaine    Time Out Verified patient identification, verified procedure, site/side was marked, verified correct patient position, special equipment/implants available, medications/allergies/relevant history reviewed, required imaging and test results available.   Sterile Technique Maximal sterile technique including full sterile barrier drape, hand hygiene, sterile gown, sterile gloves, mask, hair covering, sterile ultrasound probe cover (if used).  Procedure Description Ultrasound was used to identify appropriate pleural anatomy for placement and overlying skin marked.  Area of drainage cleaned and draped in sterile fashion. Lidocaine was used to anesthetize the skin and subcutaneous tissue.  1700 cc's of serous appearing fluid was drained from the right pleural space. Catheter then removed and bandaid applied to site.   Complications/Tolerance None; patient tolerated the procedure well. Chest X-ray is ordered to confirm no post-procedural complication.   EBL Minimal   Specimen(s) Pleural fluid  [name], MD [facility] 12/16/2022 1:16 PM

## 2022-12-16 NOTE — Assessment & Plan Note (Signed)
Replaced. °

## 2022-12-16 NOTE — Consult Note (Signed)
PHARMACY - ANTICOAGULATION CONSULT NOTE  Pharmacy Consult for IV Heparin Indication: pulmonary embolus  Patient Measurements: Height: 6' (182.9 cm) Weight: 108.9 kg (240 lb) IBW/kg (Calculated) : 77.6 Heparin Dosing Weight: 100.6 kg  Labs: Recent Labs    12/15/22 1337 12/15/22 1425 12/15/22 1530 12/15/22 1710 12/16/22 0138  HGB 13.7  --   --   --  12.3*  HCT 42.4  --   --   --  37.9*  PLT 171  --   --   --  140*  APTT  --   --  37*  --   --   LABPROT  --   --  16.3*  --   --   INR  --   --  1.3*  --   --   HEPARINUNFRC  --   --   --  0.73*  --   CREATININE 0.70  --   --   --  0.66  TROPONINIHS  --  7  --   --   --     Estimated Creatinine Clearance: 93.9 mL/min (by C-G formula based on SCr of 0.66 mg/dL).  Medical History: Past Medical History:  Diagnosis Date   A-fib [facility])    Anal fissure    Colon polyp    Hemorrhoid    Hypertension    Murmur    Rectal abscess    Medications:  Medication reconciliation is pending. Appears patient is filling apixaban 2.5 mg BID prior to admission. Last dose of apixaban unknown  Assessment: Patient is an 82 y/o M with medical history as above and including Afib on apixaban here with shortness of breath and found to have lung mass and small subsegmental PE. Pharmacy consulted to initiate and manage heparin infusion for acute PE.  Baseline CBC within normal limits. Baseline aPTT slightly elevated to 1.3 and aPTT to 37s. Baseline anti-Xa level is pending  Goal of Therapy:  Heparin level 0.3-0.7 units/ml aPTT 66 - 102 seconds Monitor platelets by anticoagulation protocol: Yes   Plan:  10/26 @ 0138:  aPTT = > 160,   HL = 1.44 - aPTT greatly elevated,  HL elevated from Eliquis PTA - will hold heparin drip for 1 hr and restart @ 1250 units/hr - will recheck aPTT 8 hrs after restart - will recheck HL on 10/27 with AM labs  Transition to anti-Xa monitoring only when correlation established --Daily CBC per protocol while on IV  heparin  Dejay Kronk D 12/16/2022,3:58 AM

## 2022-12-16 NOTE — Consult Note (Signed)
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  He reports having shortness of breath for the past couple of weeks that has been worsening and most noticeable on exertion.  He denies any cough, hemoptysis.  But does report weight loss of 10 pounds for the past 2 weeks.  He does also state that he cannot taste anything.  In the ED, he underwent a chest x-ray that showed a moderate size right pleural effusion.  Subsequently and underwent a CTA of the chest which showed a small subsegmental size embolus but also showed a large right pleural effusion associated with the right lung mass the anterior aspect of the right upper lobe.  Family history -denies any family history of lung cancer  Social history -never smoker only smoked cigars intermittently.  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  Found to have a large right-sided pleural effusion with associated  right upper lobe lung mass multiple areas of consolidative opacities.  This is highly concerning for primary lung malignancy however other etiologies such as infectious process cannot be ruled out.  Will proceed with diagnostic and therapeutic right-sided pleural effusion which could explain his dyspnea on exertion in association with right upper lobe lung mass and small subsegmental PE.  Will arrange follow up in clinic mid to end next week.   I spent 45 minutes caring for this patient today, including preparing to see the patient, obtaining a medical history , reviewing a separately obtained history, performing a medically appropriate examination and/or evaluation, counseling and educating the patient/family/caregiver, ordering medications, tests, or procedures, documenting clinical information in the electronic health record, and independently interpreting results (not separately reported/billed) and communicating results to the patient/family/caregiver  [name], MD [facility] 12/16/2022 1:15 PM

## 2022-12-16 NOTE — Assessment & Plan Note (Signed)
No signs of heart failure.  Last EF 50%.

## 2022-12-16 NOTE — Consult Note (Addendum)
PHARMACY - ANTICOAGULATION CONSULT NOTE  Pharmacy Consult for IV Heparin Indication: pulmonary embolus  Patient Measurements: Height: 6' (182.9 cm) Weight: 111.3 kg (245 lb 6 oz) IBW/kg (Calculated) : 77.6 Heparin Dosing Weight: 100.6 kg  Labs: Recent Labs    12/15/22 1337 12/15/22 1425 12/15/22 1530 12/15/22 1710 12/16/22 0138  HGB 13.7  --   --   --  12.3*  HCT 42.4  --   --   --  37.9*  PLT 171  --   --   --  140*  APTT  --   --  37*  --  >200*  LABPROT  --   --  16.3*  --   --   INR  --   --  1.3*  --   --   HEPARINUNFRC  --   --   --  0.73* >1.10*  CREATININE 0.70  --   --   --  0.66  TROPONINIHS  --  7  --   --   --     Estimated Creatinine Clearance: 94.9 mL/min (by C-G formula based on SCr of 0.66 mg/dL).  Medical History: Past Medical History:  Diagnosis Date   A-fib [facility])    Anal fissure    Colon polyp    Hemorrhoid    Hypertension    Murmur    Rectal abscess    Medications:  Medication reconciliation is pending. Appears patient is filling apixaban 2.5 mg BID prior to admission. Last dose of apixaban unknown  Assessment: Patient is an 82 y/o M with medical history as above and including Afib on apixaban here with shortness of breath and found to have lung mass and small subsegmental PE. Pharmacy consulted to initiate and manage heparin infusion for acute PE.  Baseline CBC within normal limits. Baseline aPTT slightly elevated to 1.3 and aPTT to 37s. Baseline anti-Xa level is pending  Goal of Therapy:  Heparin level 0.3-0.7 units/ml aPTT 66 - 102 seconds Monitor platelets by anticoagulation protocol: Yes   Plan:  S/p thoracentesis. Will restart heparin infusion at 1400 units/hr. Decrease from 1600 units/hr due to elevated aPTT. Heparin level is falsely elevated due to apixaban. Check aPTT in 8 hours. CBC and heparin level daily while on heparin. Switch to heparin level monitoring once aPTT and heparin level correlate.   Ronnald Ramp, PharmD,  BCPS 12/16/2022,1:56 PM

## 2022-12-16 NOTE — Plan of Care (Signed)

## 2022-12-17 DIAGNOSIS — I2699 Other pulmonary embolism without acute cor pulmonale: Secondary | ICD-10-CM | POA: Diagnosis not present

## 2022-12-17 DIAGNOSIS — R432 Parageusia: Secondary | ICD-10-CM | POA: Diagnosis not present

## 2022-12-17 DIAGNOSIS — J90 Pleural effusion, not elsewhere classified: Secondary | ICD-10-CM | POA: Diagnosis not present

## 2022-12-17 DIAGNOSIS — R918 Other nonspecific abnormal finding of lung field: Secondary | ICD-10-CM | POA: Diagnosis not present

## 2022-12-17 LAB — BASIC METABOLIC PANEL
Anion gap: 9 (ref 5–15)
BUN: 10 mg/dL (ref 8–23)
CO2: 24 mmol/L (ref 22–32)
Calcium: 8.7 mg/dL — ABNORMAL LOW (ref 8.9–10.3)
Chloride: 97 mmol/L — ABNORMAL LOW (ref 98–111)
Creatinine, Ser: 0.72 mg/dL (ref 0.61–1.24)
GFR, Estimated: >60 mL/min (ref >60)
Glucose, Bld: 109 mg/dL — ABNORMAL HIGH (ref 70–99)
Potassium: 3.6 mmol/L (ref 3.5–5.1)
Sodium: 130 mmol/L — ABNORMAL LOW (ref 135–145)

## 2022-12-17 LAB — CBC
HCT: 39.2 % (ref 39.0–52.0)
Hemoglobin: 12.9 g/dL — ABNORMAL LOW (ref 13.0–17.0)
MCH: 28.6 pg (ref 26.0–34.0)
MCHC: 32.9 g/dL (ref 30.0–36.0)
MCV: 86.9 fL (ref 80.0–100.0)
Platelets: 154 10*3/uL (ref 150–400)
RBC: 4.51 MIL/uL (ref 4.22–5.81)
RDW: 15 % (ref 11.5–15.5)
WBC: 5.2 10*3/uL (ref 4.0–10.5)
nRBC: 0 % (ref 0.0–0.2)

## 2022-12-17 LAB — GLUCOSE, CAPILLARY: Glucose-Capillary: 107 mg/dL — ABNORMAL HIGH (ref 70–99)

## 2022-12-17 LAB — HEPARIN LEVEL (UNFRACTIONATED): Heparin Unfractionated: 0.7 [IU]/mL (ref 0.30–0.70)

## 2022-12-17 LAB — APTT: aPTT: 122 s — ABNORMAL HIGH (ref 24–36)

## 2022-12-17 MED ORDER — APIXABAN 5 MG PO TABS
10.0000 mg | ORAL_TABLET | Freq: Two times a day (BID) | ORAL | Status: DC
  Administered 2022-12-17: 10 mg via ORAL
  Filled 2022-12-17: qty 2

## 2022-12-17 MED ORDER — ENSURE ENLIVE PO LIQD: 237.0000 mL | Freq: Three times a day (TID) | ORAL | 0 refills | Status: DC

## 2022-12-17 MED ORDER — APIXABAN 5 MG PO TABS: ORAL_TABLET | ORAL | 0 refills | Status: DC

## 2022-12-17 MED ORDER — APIXABAN 5 MG PO TABS: 5.0000 mg | ORAL_TABLET | Freq: Two times a day (BID) | ORAL | Status: DC

## 2022-12-17 MED ORDER — AMOXICILLIN-POT CLAVULANATE 875-125 MG PO TABS: 1.0000 | ORAL_TABLET | Freq: Two times a day (BID) | ORAL | 0 refills | Status: DC

## 2022-12-17 NOTE — Plan of Care (Signed)
  Problem: Education: Goal: Knowledge of General Education information will improve Description: Including pain rating scale, medication(s)/side effects and non-pharmacologic comfort measures 12/17/2022 0733 by Jennelle Human, RN Outcome: Progressing 12/17/2022 0733 by Jennelle Human, RN Outcome: Progressing   Problem: Clinical Measurements: Goal: Ability to maintain clinical measurements within normal limits will improve 12/17/2022 0733 by Jennelle Human, RN Outcome: Progressing 12/17/2022 0733 by Jennelle Human, RN Outcome: Progressing   Problem: Nutrition: Goal: Adequate nutrition will be maintained 12/17/2022 0733 by Jennelle Human, RN Outcome: Progressing 12/17/2022 0733 by Jennelle Human, RN Outcome: Progressing   Problem: Safety: Goal: Ability to remain free from injury will improve Outcome: Progressing   Problem: Skin Integrity: Goal: Risk for impaired skin integrity will decrease Outcome: Progressing

## 2022-12-17 NOTE — Plan of Care (Signed)
  Problem: Education: Goal: Knowledge of General Education information will improve Description: Including pain rating scale, medication(s)/side effects and non-pharmacologic comfort measures 12/17/2022 0733 by Jennelle Human, RN Outcome: Progressing 12/17/2022 0733 by Jennelle Human, RN Outcome: Progressing 12/17/2022 0733 by Jennelle Human, RN Outcome: Progressing   Problem: Clinical Measurements: Goal: Ability to maintain clinical measurements within normal limits will improve 12/17/2022 0733 by Jennelle Human, RN Outcome: Progressing 12/17/2022 0733 by Jennelle Human, RN Outcome: Progressing 12/17/2022 0733 by Jennelle Human, RN Outcome: Progressing   Problem: Nutrition: Goal: Adequate nutrition will be maintained 12/17/2022 0733 by Jennelle Human, RN Outcome: Progressing 12/17/2022 0733 by Jennelle Human, RN Outcome: Progressing 12/17/2022 0733 by Jennelle Human, RN Outcome: Progressing   Problem: Safety: Goal: Ability to remain free from injury will improve 12/17/2022 0733 by Jennelle Human, RN Outcome: Progressing 12/17/2022 0733 by Jennelle Human, RN Outcome: Progressing   Problem: Skin Integrity: Goal: Risk for impaired skin integrity will decrease Outcome: Progressing

## 2022-12-17 NOTE — Progress Notes (Signed)
Transition of Care [facility]) - Inpatient Brief Assessment   Patient Details  Name: Erik Hendricks MRN: [id number] Date of Birth: [birth year]  Transition of Care [facility]) CM/SW Contact:    Bing Quarry, RN Phone Number: 12/17/2022, 10:41 AM   Clinical Narrative:  10/27:  DC today, No TOC consult, No SDOH needs identified, s/p thoracentesis for RIGHT pleural effusion on 10/26. Htx salivary (parotid) gland Ca. Heparin gtt to Eliquis prior to discharge. HF and when to call provider instructions in AVS.  Follow up appointments in AVS: [facility] in 5 days.  [name] PCP in 1 week. MRI 11/1/ 24 at 1100 am.  [facility] [address] [place] [address] [place] [address] [phone]  Gabriel Cirri MSN RN [facility]  Care Management Department.  [place]  [facility] Direct Dial: [phone] Main Office Phone: [phone] Weekends Only      Transition of Care Asessment: Insurance and Status: Insurance coverage has been reviewed Patient has primary care physician: Yes Home environment has been reviewed: From home Prior level of function:: Progressive weakness at home Prior/Current Home Services: No current home services Social Determinants of Health Reivew: SDOH reviewed no interventions necessary Readmission risk has been reviewed: Yes Transition of care needs: no transition of care needs at this time

## 2022-12-17 NOTE — Consult Note (Signed)
PHARMACY - ANTICOAGULATION CONSULT NOTE  Pharmacy Consult for  Heparin Indication: pulmonary embolus  Patient Measurements: Height: 6' (182.9 cm) Weight: 108.5 kg (239 lb 3.2 oz) IBW/kg (Calculated) : 77.6 Heparin Dosing Weight: 100.6 kg  Labs: Recent Labs    12/15/22 1337 12/15/22 1337 12/15/22 1425 12/15/22 1530 12/15/22 1710 12/16/22 0138 12/16/22 2204 12/17/22 0615 12/17/22 0758  HGB 13.7  --   --   --   --  12.3*  --  12.9*  --   HCT 42.4  --   --   --   --  37.9*  --  39.2  --   PLT 171  --   --   --   --  140*  --  154  --   APTT  --    < >  --  37*  --  >200* 140*  --  122*  LABPROT  --   --   --  16.3*  --   --   --   --   --   INR  --   --   --  1.3*  --   --   --   --   --   HEPARINUNFRC  --   --   --   --  0.73* >1.10*  --   --  0.70  CREATININE 0.70  --   --   --   --  0.66  --  0.72  --   TROPONINIHS  --   --  7  --   --   --   --   --   --    < > = values in this interval not displayed.    Estimated Creatinine Clearance: 93.8 mL/min (by C-G formula based on SCr of 0.72 mg/dL).  Medical History: Past Medical History:  Diagnosis Date   A-fib [facility])    Anal fissure    Colon polyp    Hemorrhoid    Hypertension    Murmur    Rectal abscess    Medications:  Medication reconciliation is pending. Appears patient is filling apixaban 2.5 mg BID prior to admission. Last dose of apixaban unknown  Assessment: Patient is an 82 y/o M with medical history as above and including Afib on apixaban here with shortness of breath and found to have lung mass and small subsegmental PE. Pharmacy consulted to initiate and manage heparin infusion for acute PE.  Baseline CBC within normal limits. Baseline aPTT slightly elevated to 1.3 and aPTT to 37s. Baseline anti-Xa level is pending  10/26 2204  aPTT 140 sec    supratherapeutic 1027 0758 HL 0.7   Goal of Therapy:  Heparin level 0.3-0.7 units/ml Monitor platelets by anticoagulation protocol: Yes   Plan:  S/p  thoracentesis. APTT is supratherapeutic, but heparin level is therapeutic at the upper limit of goal. Predict apixaban is washed out. Will continue heparin infusion at 1200 units/hr. Will switch to heparin level monitoring. Check heparin level in 8 hours. CBC daily while on heparin.   Paschal Dopp, PharmD, BCPS 12/17/2022 8:46 AM

## 2022-12-17 NOTE — Discharge Summary (Signed)
Physician Discharge Summary   Patient: Erik Hendricks MRN: [id number] DOB: 11/04/1940  Admit date:     12/15/2022  Discharge date: 12/17/22  Discharge Physician: Alford Highland   PCP: [name], MD   Recommendations at discharge:   Follow-up with pulmonary Dr. 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  He presented to the emergency room with shortness of breath.  Poor appetite secondary to loss of taste.  Over the past week progressively short of breath.  In the ER, CT scan of the head neck and chest were obtained showing large right pleural effusion and right upper lobe mass and sub-segmental pulmonary embolism.  10/26.  Thoracentesis by pulmonary drew off 1.7 L of fluid.  Ultrasound lower extremities negative for DVT. 10/27.  Patient feeling much better today and interested in going home.  Case discussed with pulmonary and they will follow-up next week.  Cytology still pending.  Patient will likely end up needing another thoracentesis if fluid reaccumulate's.  Heparin drip switched over to Eliquis.  Assessment and Plan: * Pleural effusion on right 1.7 L drawn off with thoracentesis on 10/26.  Cytology sent off.  Close follow-up with pulmonary on whether fluid reaccumulates may end up needing another  thoracentesis.  If cytology turns out being positive may end up needing referral to oncology.  Pulmonary embolism (HCC) Ultrasound lower extremities negative for DVT.  Switch Eliquis to 10 mg twice a day for 1 week then 5 mg twice a day afterwards.  Mass of upper lobe of right lung Large 4.4 x 3.6 centimeter mass in the right upper lobe that was not present on PET scan on 03/09/2021.  Will see if cytology from the pleural fluid will give [place] a diagnosis if not may end up needing a biopsy and/or another thoracentesis to make diagnosis.  [place] MRI of the brain negative for metastases.  Hypokalemia Replaced  Hyponatremia Discontinue Maxide.  Sodium 130 upon discharge.   Obesity (BMI 30-39.9) BMI 32.44  Chronic diastolic CHF (congestive heart failure) (HCC) No signs of heart failure.  Last EF 50%.  Ulcerative pancolitis (HCC) - Continue home regimen  Type 2 diabetes mellitus with hyperglycemia, without long-term current use of insulin (HCC) Can go back on glipizide as outpatient.  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  Patient underwent left parotidectomy with neck dissection, adjuvant radiation therapy and then adjuvant Lupron.  PET scan in 2023 demonstrated no recurrence.  Suspect mass of the lung is likely a primary lung cancer.  Hypertension On metoprolol.  Held Cozaar this morning with blood pressure being a little on the lower side.         Consultants: Pulmonary Procedures performed: Thoracentesis Disposition: Home Diet recommendation:  Cardiac and Carb modified diet DISCHARGE MEDICATION: Allergies as of 12/17/2022  Reactions   Ciprofloxacin Nausea Only        Medication List     STOP taking these medications    aspirin EC 81 MG tablet   losartan 50 MG tablet Commonly known as: COZAAR       TAKE these medications    Alpha-Lipoic Acid 600 MG Caps Take by mouth.    amoxicillin-clavulanate 875-125 MG tablet Commonly known as: AUGMENTIN Take 1 tablet by mouth 2 (two) times daily for 3 days.   apixaban 5 MG Tabs tablet Commonly known as: ELIQUIS Take 2 tablets (10 mg total) by mouth 2 (two) times daily for 7 days, THEN 1 tablet (5 mg total) 2 (two) times daily for 23 days. Start taking on: December 17, 2022 What changed:  medication strength See the new instructions.   balsalazide 750 MG capsule Commonly known as: COLAZAL Take 1,500 mg by mouth 2 (two) times daily.   DULoxetine 60 MG capsule Commonly known as: CYMBALTA Take 60 mg by mouth daily.   feeding supplement Liqd Take 237 mLs by mouth 3 (three) times daily between meals.   fluticasone 50 MCG/ACT nasal spray Commonly known as: FLONASE Place 1 spray into both nostrils daily.   glipiZIDE 5 MG 24 hr tablet Commonly known as: GLUCOTROL XL Take 1 tablet by mouth daily.   metoprolol tartrate 50 MG tablet Commonly known as: LOPRESSOR Take 50 mg by mouth 2 (two) times daily.   Niaspan 500 MG ER tablet Generic drug: niacin Take 500 mg by mouth daily.   pregabalin 150 MG capsule Commonly known as: LYRICA Take 150 mg by mouth 2 (two) times daily.   simvastatin 20 MG tablet Commonly known as: ZOCOR Take 20 mg by mouth daily.   Vitamin D-1000 Max St 25 MCG (1000 UT) tablet Generic drug: Cholecalciferol Take by mouth.        Follow-up Information     [name], MD Follow up in 1 week(s).   Specialty: Family Medicine Contact information: [address] [place] [address] [phone]         [name], MD Follow up in 5 day(s).   Specialty: Pulmonary Disease Contact information: [address] [place] [address] [phone]                Discharge Exam: Ceasar Mons Weights   12/15/22 1339 12/16/22 0500 12/17/22 0434  Weight: 108.9 kg 111.3 kg 108.5 kg   Physical Exam HENT:     Head: Normocephalic.  Eyes:     General:  Lids are normal.     Conjunctiva/sclera: Conjunctivae normal.  Cardiovascular:     Rate and Rhythm: Normal rate and regular rhythm.     Heart sounds: Normal heart sounds, S1 normal and S2 normal.  Pulmonary:     Breath sounds: Examination of the right-lower field reveals decreased breath sounds and rhonchi. Decreased breath sounds and rhonchi present. No rales.  Abdominal:     Palpations: Abdomen is soft.     Tenderness: There is no abdominal tenderness.  Musculoskeletal:     Right lower leg: No swelling.     Left lower leg: No swelling.  Skin:    General: Skin is warm.     Findings: No rash.  Neurological:     Mental Status: He is alert and oriented to person, place, and time.      Condition at discharge: stable  The results of significant diagnostics from this hospitalization (including imaging, microbiology, ancillary and laboratory)  are listed below for reference.   Imaging Studies: US Venous Img Lower Bilateral (DVT)  Result Date: 12/16/2022 CLINICAL DATA:  Swelling EXAM: BILATERAL LOWER EXTREMITY VENOUS DOPPLER ULTRASOUND TECHNIQUE: Gray-scale sonography with compression, as well as color and duplex ultrasound, were performed to evaluate the deep venous system(s) from the level of the common femoral vein through the popliteal and proximal calf veins. COMPARISON:  03/28/2019 FINDINGS: VENOUS Normal compressibility of the common femoral, superficial femoral, and popliteal veins, as well as the visualized calf veins. Visualized portions of profunda femoral vein and great saphenous vein unremarkable. No filling defects to suggest DVT on grayscale or color Doppler imaging. Doppler waveforms show normal direction of venous flow, normal respiratory plasticity and response to augmentation. OTHER None. Limitations: none IMPRESSION: 1. No evidence of deep venous thrombosis within either lower extremity. Electronically Signed   By: Sharlet Salina M.D.   On: 12/16/2022 19:59   DG Chest Port  1 View  Result Date: 12/16/2022 CLINICAL DATA:  Pleural effusion EXAM: PORTABLE CHEST 1 VIEW COMPARISON:  12/15/2022 FINDINGS: The heart size and mediastinal contours are within normal limits. Slightly diminished, moderate, layering right pleural effusion and associated atelectasis or consolidation. No left pleural effusion. Left lung normally aerated. The visualized skeletal structures are unremarkable. IMPRESSION: Slightly diminished, moderate, layering right pleural effusion and associated atelectasis or consolidation. No left pleural effusion. Electronically Signed   By: Jearld Lesch M.D.   On: 12/16/2022 17:24   MR BRAIN W WO CONTRAST  Result Date: 12/15/2022 CLINICAL DATA:  Salivary ductal carcinoma. Assessment for metastatic disease. EXAM: MRI HEAD WITHOUT AND WITH CONTRAST TECHNIQUE: Multiplanar, multiecho pulse sequences of the brain and surrounding structures were obtained without and with intravenous contrast. CONTRAST:  10mL GADAVIST GADOBUTROL 1 MMOL/ML IV SOLN COMPARISON:  None Available. FINDINGS: Brain: No acute infarct, mass effect or extra-axial collection. No acute or chronic hemorrhage. There is multifocal hyperintense T2-weighted signal within the white matter. Parenchymal volume and CSF spaces are normal. Vascular: Abnormal left vertebral artery flow void. Skull and upper cervical spine: Normal calvarium and skull base. Visualized upper cervical spine and soft tissues are normal. Sinuses/Orbits:Left mastoid effusion. Paranasal sinuses are clear. Ocular lens replacements. IMPRESSION: 1. No intracranial metastatic disease. 2. Abnormal left vertebral artery flow void, consistent with slow flow or occlusion. 3. Left mastoid effusion. Electronically Signed   By: Deatra Robinson M.D.   On: 12/15/2022 23:30   CT Soft Tissue Neck W Contrast  Result Date: 12/15/2022 CLINICAL DATA:  Soft tissue infection suspected, neck, xray done h/o L parotid cancer, change of voice, touble swallowing.  EXAM: CT NECK WITH CONTRAST TECHNIQUE: Multidetector CT imaging of the neck was performed using the standard protocol following the bolus administration of intravenous contrast. RADIATION DOSE REDUCTION: This exam was performed according to the departmental dose-optimization program which includes automated exposure control, adjustment of the mA and/or kV according to patient size and/or use of iterative reconstruction technique. CONTRAST:  OMNIPAQUE IOHEXOL 350 MG/ML SOLN COMPARISON:  Neck CT 05/09/2018. FINDINGS: Pharynx and larynx: Normal. No mass or swelling.  Glottis is closed. Salivary glands: Prior left parotidectomy. Atrophy of the left submandibular gland, likely posttreatment. Normal right parotid and submandibular gland. Thyroid: Normal. Lymph nodes: Prior selective left neck dissection. No suspicious cervical lymphadenopathy. Vascular: Atherosclerotic calcifications of the carotid bulbs. Limited intracranial: Unremarkable. Visualized orbits: Unremarkable. Mastoids and visualized paranasal sinuses: Well aerated. Skeleton: Diffuse idiopathic skeletal hyperostosis with prominent anterior osteophytes at T2-3. Upper chest: Lobulated mass in the anterior  right upper lobe segment with possible extrapleural extension and mediastinal invasion, measuring up to 4.1 x 3.6 cm (axial image 135 series 5), suspicious for malignancy. Moderate right pleural effusion. Other: None. IMPRESSION: 1. Lobulated mass in the anterior right upper lobe segment with possible extrapleural extension and mediastinal invasion, measuring up to 4.1 cm, suspicious for malignancy. Dedicated chest CT is recommended for further evaluation. 2. Moderate right pleural effusion. 3. Prior left parotidectomy and selective left neck dissection. No suspicious cervical lymphadenopathy. Electronically Signed   By: Orvan Falconer M.D.   On: 12/15/2022 16:28   CT Head Wo Contrast  Result Date: 12/15/2022 CLINICAL DATA:  Mental status change,  unknown cause. EXAM: CT HEAD WITHOUT CONTRAST TECHNIQUE: Contiguous axial images were obtained from the base of the skull through the vertex without intravenous contrast. RADIATION DOSE REDUCTION: This exam was performed according to the departmental dose-optimization program which includes automated exposure control, adjustment of the mA and/or kV according to patient size and/or use of iterative reconstruction technique. COMPARISON:  None Available. 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 Gray-white differentiation is otherwise preserved. Mild patchy hypoattenuation of the periventricular white matter, most consistent with mild chronic small-vessel disease. No hydrocephalus or extra-axial collection. No mass effect or midline shift. Vascular: No hyperdense vessel or unexpected calcification. Skull: No calvarial fracture or suspicious bone lesion. Skull base is unremarkable. Sinuses/Orbits: No acute findings. Other: None. IMPRESSION: 1. 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 Consider MRI for further evaluation. 2. Mild chronic small-vessel disease. Electronically Signed   By: Orvan Falconer M.D.   On: 12/15/2022 16:16   CT Angio Chest Pulmonary Embolism (PE) W or WO Contrast  Result Date: 12/15/2022 CLINICAL DATA:  82 year old male with history of increasing shortness of breath. Evaluate for pulmonary embolism. EXAM: CT ANGIOGRAPHY CHEST WITH CONTRAST TECHNIQUE: Multidetector CT imaging of the chest was performed using the standard protocol during bolus administration of intravenous contrast. Multiplanar CT image reconstructions and MIPs were obtained to evaluate the vascular anatomy. RADIATION DOSE REDUCTION: This exam was performed according to the departmental dose-optimization program which includes automated exposure control, adjustment of the mA and/or kV according to patient size and/or use of iterative reconstruction technique.  CONTRAST:  OMNIPAQUE IOHEXOL 350 MG/ML SOLN COMPARISON:  No priors. FINDINGS: Cardiovascular: Study is slightly limited by patient respiratory motion and suboptimal contrast bolus. With these limitations in mind there is no definite central, lobar or segmental sized filling defect in the pulmonary arterial tree to indicate clinically significant pulmonary embolism. However, there does appear to be a subsegmental sized embolus to the left upper lobe (axial image 182 of series 6) which appears either occlusive or nearly completely occlusive. Heart size is normal. Small amount of pericardial fluid and/or thickening. No pericardial calcification. There is aortic atherosclerosis, as well as atherosclerosis of the great vessels of the mediastinum and the coronary arteries, including calcified atherosclerotic plaque in the left main, left anterior descending, left circumflex and right coronary arteries. Thickening and calcification of the aortic valve. Mediastinum/Nodes: No pathologically enlarged mediastinal or hilar lymph nodes. Esophagus is unremarkable in appearance. No axillary lymphadenopathy. Lungs/Pleura: Multiple nodular and mass-like areas of architectural distortion are noted in the anterior aspect of the right upper lobe, largest of which (axial image 87 of series 7) is estimated to measure approximately 4.4 x 3.6 cm and has macrolobulated and slightly spiculated margins, concerning for neoplasm. Dependent areas of atelectasis are also noted in the right lung. Consolidative changes  are also noted in the base of the right lower lobe. Left lung appears clear. Large right pleural effusion lying dependently. Upper Abdomen: Visualized portions of the liver have a shrunken appearance and nodular contour, suggesting underlying cirrhosis. Musculoskeletal: There are no aggressive appearing lytic or blastic lesions noted in the visualized portions of the skeleton. Review of the MIP images confirms the above  findings. IMPRESSION: 1. Study is positive for a small subsegmental sized embolus to the left upper lobe, as above. 2. Importantly, however, there are nodular and mass-like regions in the right lung, most notable for a 4.4 x 3.6 cm mass in the anterior aspect of the right upper lobe concerning for potential neoplasm. Associated with this is a large right pleural effusion which may be malignant. Further clinical evaluation and consideration for follow-up PET-CT in the near future is strongly recommended to better evaluate these findings. 3. This right pleural effusion is associated with considerable areas of atelectasis in the right lung, as well as some consolidative changes in the basal segments of the right lower lobe concerning for pneumonia. 4. Aortic atherosclerosis, in addition to left main and three-vessel coronary artery disease. Please note that although the presence of coronary artery calcium documents the presence of coronary artery disease, the severity of this disease and any potential stenosis cannot be assessed on this non-gated CT examination. Assessment for potential risk factor modification, dietary therapy or pharmacologic therapy may be warranted, if clinically indicated. 5. There are calcifications of the aortic valve. Echocardiographic correlation for evaluation of potential valvular dysfunction may be warranted if clinically indicated. Aortic Atherosclerosis (ICD10-I70.0). Electronically Signed   By: Trudie Reed M.D.   On: 12/15/2022 16:01   DG Chest 2 View  Result Date: 12/15/2022 CLINICAL DATA:  Shortness of breath. EXAM: CHEST - 2 VIEW COMPARISON:  None Available. FINDINGS: The heart size appears enlarged. There is obscuration of the right cardiac silhouette. Moderate-sized right pleural effusion with associated basilar atelectasis. Bilateral central perihilar prominence. No pneumothorax. No acute osseous abnormality. IMPRESSION: 1. Moderate-sized right pleural effusion with  associated basilar atelectasis. 2. Patchy right middle and lower lung zone opacities may represent an infectious/inflammatory etiology. Electronically Signed   By: Hart Robinsons M.D.   On: 12/15/2022 15:27    Microbiology: Results for orders placed or performed during the hospital encounter of 12/15/22  Blood culture (single)     Status: None (Preliminary result)   Collection Time: 12/15/22  2:25 PM   Specimen: BLOOD  Result Value Ref Range Status   Specimen Description BLOOD BLOOD RIGHT FOREARM  Final   Special Requests   Final    BOTTLES DRAWN AEROBIC AND ANAEROBIC Blood Culture adequate volume   Culture   Final    NO GROWTH 2 DAYS Performed at [facility], [address] [place], [place] [address]    Report Status PENDING  Incomplete  Culture, blood (single)     Status: None (Preliminary result)   Collection Time: 12/15/22  3:30 PM   Specimen: BLOOD  Result Value Ref Range Status   Specimen Description BLOOD BLOOD RIGHT WRIST  Final   Special Requests   Final    BOTTLES DRAWN AEROBIC AND ANAEROBIC Blood Culture adequate volume   Culture   Final    NO GROWTH 2 DAYS Performed at [facility], [address], [place], [place] [address]    Report Status PENDING  Incomplete  Pleural fluid culture w Gram Stain     Status: None (Preliminary result)  Collection Time: 12/16/22 12:49 PM   Specimen: Pleural Fluid  Result Value Ref Range Status   Specimen Description   Final    PLEURAL Performed at [facility], [address] [place], [place] [address]    Special Requests   Final    NONE Performed at [facility], [address] [place], [place] [address]    Gram Stain   Final    FEW WBC PRESENT, PREDOMINANTLY MONONUCLEAR NO ORGANISMS SEEN    Culture   Final    NO GROWTH < 24 HOURS Performed at [facility], [address], [place], [place] [address]    Report Status PENDING  Incomplete  C Difficile Quick Screen w PCR  reflex     Status: None   Collection Time: 12/16/22  5:24 PM   Specimen: STOOL  Result Value Ref Range Status   C Diff antigen NEGATIVE NEGATIVE Final   C Diff toxin NEGATIVE NEGATIVE Final   C Diff interpretation No C. difficile detected.  Final    Comment: Performed at [facility] [address] [place], [place] [address]    Labs: CBC: Recent Labs  Lab 12/15/22 1337 12/16/22 0138 12/17/22 0615  WBC 4.7 4.3 5.2  HGB 13.7 12.3* 12.9*  HCT 42.4 37.9* 39.2  MCV 87.4 86.7 86.9  PLT 171 140* 154   Basic Metabolic Panel: Recent Labs  Lab 12/15/22 1337 12/16/22 0138 12/17/22 0615  NA 132* 130* 130*  K 3.7 3.4* 3.6  CL 97* 96* 97*  CO2 20* 24 24  GLUCOSE 95 100* 109*  BUN 7* 7* 10  CREATININE 0.70 0.66 0.72  CALCIUM 8.8* 8.4* 8.7*   Liver Function Tests: No results for input(s): "AST", "ALT", "ALKPHOS", "BILITOT", "PROT", "ALBUMIN" in the last 168 hours. CBG: Recent Labs  Lab 12/16/22 0908 12/16/22 1152 12/16/22 1625 12/16/22 2008 12/17/22 0755  GLUCAP 132* 119* 151* 203* 107*    Discharge time spent: greater than 30 minutes.  Signed: Alford Highland, MD [facility] 12/17/2022

## 2022-12-17 NOTE — Plan of Care (Signed)
  Problem: Education: Goal: Knowledge of General Education information will improve Description: Including pain rating scale, medication(s)/side effects and non-pharmacologic comfort measures Outcome: Progressing   Problem: Clinical Measurements: Goal: Ability to maintain clinical measurements within normal limits will improve Outcome: Progressing   Problem: Nutrition: Goal: Adequate nutrition will be maintained Outcome: Progressing   Problem: Skin Integrity: Goal: Risk for impaired skin integrity will decrease Outcome: Progressing   

## 2022-12-18 ENCOUNTER — Telehealth: Payer: Self-pay

## 2022-12-18 NOTE — Telephone Encounter (Signed)
Lm for patient.  

## 2022-12-18 NOTE — Telephone Encounter (Signed)
-----   Message from [name] sent at 12/17/2022  8:58 AM EDT ----- Eloisa Northern,  Can we please schedule this patient to see me sometime next week Wednesday or Thursday.   Thanks,  JP

## 2022-12-19 ENCOUNTER — Emergency Department: Payer: 59

## 2022-12-19 ENCOUNTER — Inpatient Hospital Stay
Admission: EM | Admit: 2022-12-19 | Discharge: 2022-12-22 | DRG: 177 | Disposition: A | Discharge status: Home / Self Care | Payer: 59 | Attending: Osteopathic Medicine | Admitting: Osteopathic Medicine

## 2022-12-19 DIAGNOSIS — R0602 Shortness of breath: Secondary | ICD-10-CM | POA: Diagnosis present

## 2022-12-19 DIAGNOSIS — Z79899 Other long term (current) drug therapy: Secondary | ICD-10-CM

## 2022-12-19 DIAGNOSIS — I4891 Unspecified atrial fibrillation: Secondary | ICD-10-CM | POA: Diagnosis present

## 2022-12-19 DIAGNOSIS — E871 Hypo-osmolality and hyponatremia: Secondary | ICD-10-CM | POA: Diagnosis present

## 2022-12-19 DIAGNOSIS — I2699 Other pulmonary embolism without acute cor pulmonale: Secondary | ICD-10-CM | POA: Diagnosis present

## 2022-12-19 DIAGNOSIS — U071 COVID-19: Secondary | ICD-10-CM | POA: Diagnosis present

## 2022-12-19 DIAGNOSIS — Z8589 Personal history of malignant neoplasm of other organs and systems: Secondary | ICD-10-CM | POA: Diagnosis not present

## 2022-12-19 DIAGNOSIS — J9601 Acute respiratory failure with hypoxia: Secondary | ICD-10-CM | POA: Diagnosis present

## 2022-12-19 DIAGNOSIS — R918 Other nonspecific abnormal finding of lung field: Secondary | ICD-10-CM | POA: Diagnosis present

## 2022-12-19 DIAGNOSIS — Z9049 Acquired absence of other specified parts of digestive tract: Secondary | ICD-10-CM

## 2022-12-19 DIAGNOSIS — R7401 Elevation of levels of liver transaminase levels: Secondary | ICD-10-CM | POA: Diagnosis present

## 2022-12-19 DIAGNOSIS — R895 Abnormal microbiological findings in specimens from other organs, systems and tissues: Secondary | ICD-10-CM | POA: Diagnosis not present

## 2022-12-19 DIAGNOSIS — J90 Pleural effusion, not elsewhere classified: Principal | ICD-10-CM | POA: Diagnosis present

## 2022-12-19 DIAGNOSIS — I2510 Atherosclerotic heart disease of native coronary artery without angina pectoris: Secondary | ICD-10-CM | POA: Diagnosis present

## 2022-12-19 DIAGNOSIS — Z881 Allergy status to other antibiotic agents status: Secondary | ICD-10-CM

## 2022-12-19 DIAGNOSIS — I2693 Single subsegmental pulmonary embolism without acute cor pulmonale: Secondary | ICD-10-CM | POA: Diagnosis present

## 2022-12-19 DIAGNOSIS — Z9221 Personal history of antineoplastic chemotherapy: Secondary | ICD-10-CM

## 2022-12-19 DIAGNOSIS — Z87891 Personal history of nicotine dependence: Secondary | ICD-10-CM

## 2022-12-19 DIAGNOSIS — I110 Hypertensive heart disease with heart failure: Secondary | ICD-10-CM | POA: Diagnosis present

## 2022-12-19 DIAGNOSIS — Z923 Personal history of irradiation: Secondary | ICD-10-CM

## 2022-12-19 DIAGNOSIS — Z7901 Long term (current) use of anticoagulants: Secondary | ICD-10-CM

## 2022-12-19 DIAGNOSIS — I4811 Longstanding persistent atrial fibrillation: Secondary | ICD-10-CM | POA: Diagnosis present

## 2022-12-19 DIAGNOSIS — E1142 Type 2 diabetes mellitus with diabetic polyneuropathy: Secondary | ICD-10-CM | POA: Diagnosis present

## 2022-12-19 DIAGNOSIS — I5032 Chronic diastolic (congestive) heart failure: Secondary | ICD-10-CM | POA: Diagnosis present

## 2022-12-19 DIAGNOSIS — E1165 Type 2 diabetes mellitus with hyperglycemia: Secondary | ICD-10-CM | POA: Diagnosis present

## 2022-12-19 DIAGNOSIS — Z6832 Body mass index (BMI) 32.0-32.9, adult: Secondary | ICD-10-CM

## 2022-12-19 DIAGNOSIS — J910 Malignant pleural effusion: Secondary | ICD-10-CM

## 2022-12-19 DIAGNOSIS — Z860100 Personal history of colon polyps, unspecified: Secondary | ICD-10-CM

## 2022-12-19 DIAGNOSIS — C3411 Malignant neoplasm of upper lobe, right bronchus or lung: Secondary | ICD-10-CM | POA: Diagnosis not present

## 2022-12-19 DIAGNOSIS — E66811 Obesity, class 1: Secondary | ICD-10-CM | POA: Diagnosis present

## 2022-12-19 DIAGNOSIS — E785 Hyperlipidemia, unspecified: Secondary | ICD-10-CM | POA: Diagnosis present

## 2022-12-19 DIAGNOSIS — Z7984 Long term (current) use of oral hypoglycemic drugs: Secondary | ICD-10-CM

## 2022-12-19 DIAGNOSIS — I10 Essential (primary) hypertension: Secondary | ICD-10-CM | POA: Diagnosis present

## 2022-12-19 LAB — TRIGLYCERIDES, BODY FLUIDS: Triglycerides, Fluid: 51 mg/dL

## 2022-12-19 LAB — CBC WITH DIFFERENTIAL/PLATELET
Abs Immature Granulocytes: 0.02 10*3/uL (ref 0.00–0.07)
Basophils Absolute: 0 10*3/uL (ref 0.0–0.1)
Basophils Relative: 1 %
Eosinophils Absolute: 0 10*3/uL (ref 0.0–0.5)
Eosinophils Relative: 1 %
HCT: 42.1 % (ref 39.0–52.0)
Hemoglobin: 13.5 g/dL (ref 13.0–17.0)
Immature Granulocytes: 1 %
Lymphocytes Relative: 5 %
Lymphs Abs: 0.2 10*3/uL — ABNORMAL LOW (ref 0.7–4.0)
MCH: 28 pg (ref 26.0–34.0)
MCHC: 32.1 g/dL (ref 30.0–36.0)
MCV: 87.3 fL (ref 80.0–100.0)
Monocytes Absolute: 0.4 10*3/uL (ref 0.1–1.0)
Monocytes Relative: 8 %
Neutro Abs: 3.8 10*3/uL (ref 1.7–7.7)
Neutrophils Relative %: 84 %
Platelets: 173 10*3/uL (ref 150–400)
RBC: 4.82 MIL/uL (ref 4.22–5.81)
RDW: 15.5 % (ref 11.5–15.5)
WBC: 4.4 10*3/uL (ref 4.0–10.5)
nRBC: 0 % (ref 0.0–0.2)

## 2022-12-19 LAB — APTT: aPTT: 40 s — ABNORMAL HIGH (ref 24–36)

## 2022-12-19 LAB — COMPREHENSIVE METABOLIC PANEL
ALT: 62 U/L — ABNORMAL HIGH (ref 0–44)
AST: 84 U/L — ABNORMAL HIGH (ref 15–41)
Albumin: 3.8 g/dL (ref 3.5–5.0)
Alkaline Phosphatase: 250 U/L — ABNORMAL HIGH (ref 38–126)
Anion gap: 13 (ref 5–15)
BUN: 12 mg/dL (ref 8–23)
CO2: 24 mmol/L (ref 22–32)
Calcium: 9.1 mg/dL (ref 8.9–10.3)
Chloride: 93 mmol/L — ABNORMAL LOW (ref 98–111)
Creatinine, Ser: 0.86 mg/dL (ref 0.61–1.24)
GFR, Estimated: >60 mL/min (ref >60)
Glucose, Bld: 120 mg/dL — ABNORMAL HIGH (ref 70–99)
Potassium: 3.7 mmol/L (ref 3.5–5.1)
Sodium: 130 mmol/L — ABNORMAL LOW (ref 135–145)
Total Bilirubin: 1.5 mg/dL — ABNORMAL HIGH (ref 0.3–1.2)
Total Protein: 7 g/dL (ref 6.5–8.1)

## 2022-12-19 LAB — RESP PANEL BY RT-PCR (RSV, FLU A&B, COVID)  RVPGX2
Influenza A by PCR: NEGATIVE
Influenza B by PCR: NEGATIVE
Resp Syncytial Virus by PCR: NEGATIVE
SARS Coronavirus 2 by RT PCR: POSITIVE — AB

## 2022-12-19 LAB — PROTIME-INR
INR: 1.7 — ABNORMAL HIGH (ref 0.8–1.2)
Prothrombin Time: 20.6 s — ABNORMAL HIGH (ref 11.4–15.2)

## 2022-12-19 LAB — LACTIC ACID, PLASMA: Lactic Acid, Venous: 1.8 mmol/L (ref 0.5–1.9)

## 2022-12-19 LAB — HEPARIN LEVEL (UNFRACTIONATED): Heparin Unfractionated: >1.1 [IU]/mL — ABNORMAL HIGH (ref 0.30–0.70)

## 2022-12-19 MED ORDER — ACETAMINOPHEN 650 MG RE SUPP: 650.0000 mg | Freq: Four times a day (QID) | RECTAL | Status: DC | PRN

## 2022-12-19 MED ORDER — SODIUM CHLORIDE 0.9% FLUSH
10.0000 mL | Freq: Two times a day (BID) | INTRAVENOUS | Status: DC
Start: 2022-12-19 — End: 2022-12-22
  Administered 2022-12-20 – 2022-12-22 (×5): 10 mL via INTRAVENOUS

## 2022-12-19 MED ORDER — SODIUM CHLORIDE 0.9 % IV SOLN: 250.0000 mL | INTRAVENOUS | Status: DC | PRN

## 2022-12-19 MED ORDER — ACETAMINOPHEN 325 MG PO TABS
650.0000 mg | ORAL_TABLET | Freq: Four times a day (QID) | ORAL | Status: DC | PRN
Start: 2022-12-19 — End: 2022-12-22

## 2022-12-19 MED ORDER — SODIUM CHLORIDE 0.9% FLUSH: 3.0000 mL | INTRAVENOUS | Status: DC | PRN

## 2022-12-19 MED ORDER — SODIUM CHLORIDE 0.9 % IV SOLN
2.0000 g | Freq: Once | INTRAVENOUS | Status: AC
  Administered 2022-12-19: 2 g via INTRAVENOUS
  Filled 2022-12-19: qty 20

## 2022-12-19 MED ORDER — SODIUM CHLORIDE 0.9 % IV SOLN
500.0000 mg | Freq: Once | INTRAVENOUS | Status: AC
  Administered 2022-12-19: 500 mg via INTRAVENOUS
  Filled 2022-12-19: qty 5

## 2022-12-19 MED ORDER — SODIUM CHLORIDE 0.9 % IV SOLN: 500.0000 mg | INTRAVENOUS | Status: DC

## 2022-12-19 MED ORDER — VANCOMYCIN HCL IN DEXTROSE 1-5 GM/200ML-% IV SOLN
1000.0000 mg | Freq: Once | INTRAVENOUS | Status: AC
  Administered 2022-12-19: 1000 mg via INTRAVENOUS
  Filled 2022-12-19: qty 200

## 2022-12-19 MED ORDER — HEPARIN (PORCINE) 25000 UT/250ML-% IV SOLN
1600.0000 [IU]/h | INTRAVENOUS | Status: DC
  Administered 2022-12-19: 1600 [IU]/h via INTRAVENOUS

## 2022-12-19 MED ORDER — SODIUM CHLORIDE 0.9% FLUSH: 3.0000 mL | Freq: Two times a day (BID) | INTRAVENOUS | Status: DC

## 2022-12-19 MED ORDER — HEPARIN BOLUS VIA INFUSION
5000.0000 [IU] | Freq: Once | INTRAVENOUS | Status: DC
  Filled 2022-12-19: qty 5000

## 2022-12-19 MED ORDER — SODIUM CHLORIDE 0.9 % IV SOLN: 2.0000 g | INTRAVENOUS | Status: DC

## 2022-12-19 MED ORDER — HEPARIN (PORCINE) 25000 UT/250ML-% IV SOLN
1600.0000 [IU]/h | INTRAVENOUS | Status: DC
  Filled 2022-12-19: qty 250

## 2022-12-19 MED ORDER — VANCOMYCIN HCL IN DEXTROSE 1-5 GM/200ML-% IV SOLN: 1000.0000 mg | Freq: Once | INTRAVENOUS | Status: DC

## 2022-12-19 MED ORDER — NIRMATRELVIR/RITONAVIR (PAXLOVID)TABLET: 3.0000 | ORAL_TABLET | Freq: Two times a day (BID) | ORAL | Status: DC

## 2022-12-19 MED ORDER — ALBUTEROL SULFATE (2.5 MG/3ML) 0.083% IN NEBU: 2.5000 mg | INHALATION_SOLUTION | Freq: Four times a day (QID) | RESPIRATORY_TRACT | Status: DC | PRN

## 2022-12-19 MED ORDER — VANCOMYCIN HCL 1250 MG/250ML IV SOLN
1250.0000 mg | Freq: Two times a day (BID) | INTRAVENOUS | Status: DC
  Administered 2022-12-20: 1250 mg via INTRAVENOUS
  Filled 2022-12-19 (×2): qty 250

## 2022-12-19 NOTE — Assessment & Plan Note (Signed)
Hold eliquis and heparin gtt d/w pharmacy about plan.

## 2022-12-19 NOTE — Consult Note (Signed)
PHARMACY -  BRIEF ANTIBIOTIC NOTE   Pharmacy has received consult(s) for vancomycin from an ED provider.  The patient's profile has been reviewed for ht/wt/allergies/indication/available labs.    One time order(s) placed for vancomycin 2 g x1  Further antibiotics/pharmacy consults should be ordered by admitting physician if indicated.                       Effie Shy, PharmD Pharmacy Resident  12/19/2022 6:32 PM

## 2022-12-19 NOTE — Assessment & Plan Note (Signed)
Supplemental oxygen. O2 use is new.

## 2022-12-19 NOTE — H&P (Signed)
History and Physical    Patient: Erik Hendricks ZHY:[id number] DOB: 1940-09-21 DOA: 12/19/2022 DOS: the patient was seen and examined on 12/19/2022 PCP: [name], MD  Patient coming from: Home  Chief Complaint:  Chief Complaint  Patient presents with   Pleural Effusion   Shortness of Breath   Weakness   HPI: Erik Hendricks is a 82 y.o. 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  Pleural effusion status post thoracentesis on the right side, cytology results showed concern for empyema.  Patient was advised to come to the emergency room immediately.   In emergency room vitals trend shows: Vitals:   12/19/22 1758 12/19/22 1802 12/19/22 1905 12/19/22 2000  BP: (!) 105/58  103/87 (!) 146/92  Pulse: 62  96 (!) 109  Temp: 98.4 F (36.9 C)     Resp: (!) 21  20   SpO2: (!) 88% 92% 96% 96%  Labs are notable for : -Mild hyponatremia of 130 glucose 120 normal kidney function alk phos 250 AST 84 ALT 62 total bili 1.5 lactic acid 1.8. -Normal CBC with a white count of 4.4 hemoglobin of 13.5 and platelets of 173. -Pt is Covid positive.  -Pleural fluid culture with Gram stain showed few WBCs and rare bacillus species. In the ED pt received: Medications  heparin bolus via infusion 5,000 Units (0 Units Intravenous Hold 12/19/22 2258)  cefTRIAXone (ROCEPHIN) 2 g in sodium chloride 0.9 % 100 mL IVPB (has no administration in time range)  azithromycin (ZITHROMAX) 500 mg in sodium chloride 0.9 % 250 mL IVPB (has no administration in time range)  vancomycin (VANCOREADY) IVPB 1250 mg/250 mL (has no administration in time range)   acetaminophen (TYLENOL) tablet 650 mg (has no administration in time range)    Or  acetaminophen (TYLENOL) suppository 650 mg (has no administration in time range)  sodium chloride flush (NS) 0.9 % injection 10 mL (10 mLs Intravenous Not Given 12/19/22 2247)  albuterol (PROVENTIL) (2.5 MG/3ML) 0.083% nebulizer solution 2.5 mg (has no administration in time range)  heparin ADULT infusion 100 units/mL (25000 units/261mL) (has no administration in time range)  cefTRIAXone (ROCEPHIN) 2 g in sodium chloride 0.9 % 100 mL IVPB (0 g Intravenous Stopped 12/19/22 1925)  azithromycin (ZITHROMAX) 500 mg in sodium chloride 0.9 % 250 mL IVPB (0 mg Intravenous Stopped 12/19/22 2022)  vancomycin (VANCOCIN) IVPB 1000 mg/200 mL premix (0 mg Intravenous Stopped 12/19/22 2236)    Followed by  vancomycin (VANCOCIN) IVPB 1000 mg/200 mL premix (0 mg Intravenous Stopped 12/19/22 2237)   Review of Systems  Constitutional:  Positive for malaise/fatigue.  Respiratory:  Positive for shortness of breath.   Neurological:  Positive for weakness.   Past Medical History:  Diagnosis Date   A-fib [facility])    Anal fissure    Colon polyp    Hemorrhoid    Hypertension    Murmur    Rectal abscess    Past Surgical History:  Procedure Laterality Date   ANAL FISTULECTOMY  September 2013   Posterior subcutaneous fistula treated by fistulotomy.   COLONOSCOPY  2007, 2012   Dr Lemar Livings   COLONOSCOPY WITH PROPOFOL N/A 06/21/2018   Procedure: COLONOSCOPY WITH PROPOFOL;  Surgeon: Christena Deem, MD;  Location: [facility] ENDOSCOPY;  Service: Endoscopy;  Laterality: N/A;   FISSURECTOMY     40 yrs ago   KNEE SURGERY Left 2005   POLYPECTOMY  2007   RECTAL SURGERY  40 yrs ago   cyst    reports that he has quit smoking. His smoking use included cigars. He has never used smokeless tobacco. He reports that he does not drink alcohol and does not use drugs.  Allergies  Allergen Reactions   Ciprofloxacin Nausea Only   Family History   Problem Relation Age of Onset   Heart Problems Father    Prostate cancer Neg Hx    Bladder Cancer Neg Hx    Prior to Admission medications   Medication Sig Start Date End Date Taking? Authorizing Provider  Alpha-Lipoic Acid 600 MG CAPS Take by mouth.    [provider]  amoxicillin-clavulanate (AUGMENTIN) 875-125 MG tablet Take 1 tablet by mouth 2 (two) times daily for 3 days. 12/17/22 12/20/22  Alford Highland, MD  apixaban (ELIQUIS) 5 MG TABS tablet Take 2 tablets (10 mg total) by mouth 2 (two) times daily for 7 days, THEN 1 tablet (5 mg total) 2 (two) times daily for 23 days. 12/17/22 01/16/23  Alford Highland, MD  balsalazide (COLAZAL) 750 MG capsule Take 1,500 mg by mouth 2 (two) times daily.    [provider]  Cholecalciferol (VITAMIN D-1000 MAX ST) 25 MCG (1000 UT) tablet Take by mouth.    [provider]  DULoxetine (CYMBALTA) 60 MG capsule Take 60 mg by mouth daily.    [provider]  feeding supplement (ENSURE ENLIVE / ENSURE PLUS) LIQD Take 237 mLs by mouth 3 (three) times daily between meals. 12/17/22   Alford Highland, MD  fluticasone (FLONASE) 50 MCG/ACT nasal spray Place 1 spray into both nostrils daily.    [provider]  glipiZIDE (GLUCOTROL XL) 5 MG 24 hr tablet Take 1 tablet by mouth daily. 06/22/22   [provider]  metoprolol tartrate (LOPRESSOR) 50 MG tablet Take 50 mg by mouth 2 (two) times daily. 05/02/18   [provider]  niacin (NIASPAN) 500 MG CR tablet Take 500 mg by mouth daily.     [provider]  pregabalin (LYRICA) 150 MG capsule Take 150 mg by mouth 2 (two) times daily. 04/18/18   [provider]  simvastatin (ZOCOR) 20 MG tablet Take 20 mg by mouth daily.  02/01/18   [provider]   Vitals:   12/19/22 1758 12/19/22 1802 12/19/22 1905 12/19/22 2000  BP: (!) 105/58  103/87 (!) 146/92  Pulse: 62  96 (!) 109  Resp: (!) 21  20   Temp: 98.4 F (36.9 C)     SpO2:  (!) 88% 92% 96% 96%   Physical Exam Vitals and nursing note reviewed.  Constitutional:      General: He is not in acute distress. Eyes:     Extraocular Movements: Extraocular movements intact.  Cardiovascular:     Rate and Rhythm: Normal rate and regular rhythm.     Pulses: Normal pulses.     Heart sounds: Normal heart sounds.  Pulmonary:     Effort: Pulmonary effort is normal.     Breath sounds: Rales present.  Abdominal:     General: Bowel sounds are normal.     Palpations: Abdomen is soft.  Musculoskeletal:     Right lower leg: No edema.     Left lower leg: No edema.  Neurological:     General:  No focal deficit present.     Mental Status: He is alert and oriented to person, place, and time.   Labs on Admission: I have personally reviewed following labs and imaging studies Results for orders placed or performed during the hospital encounter of 12/19/22 (from the past 24 hour(s))  Resp panel by RT-PCR (RSV, Flu A&B, Covid) Anterior Nasal Swab     Status: Abnormal   Collection Time: 12/19/22  6:40 PM   Specimen: Anterior Nasal Swab  Result Value Ref Range   SARS Coronavirus 2 by RT PCR POSITIVE (A) NEGATIVE   Influenza A by PCR NEGATIVE NEGATIVE   Influenza B by PCR NEGATIVE NEGATIVE   Resp Syncytial Virus by PCR NEGATIVE NEGATIVE  Lactic acid, plasma     Status: None   Collection Time: 12/19/22  6:40 PM  Result Value Ref Range   Lactic Acid, Venous 1.8 0.5 - 1.9 mmol/L  Comprehensive metabolic panel     Status: Abnormal   Collection Time: 12/19/22  6:40 PM  Result Value Ref Range   Sodium 130 (L) 135 - 145 mmol/L   Potassium 3.7 3.5 - 5.1 mmol/L   Chloride 93 (L) 98 - 111 mmol/L   CO2 24 22 - 32 mmol/L   Glucose, Bld 120 (H) 70 - 99 mg/dL   BUN 12 8 - 23 mg/dL   Creatinine, Ser 0.86 0.61 - 1.24 mg/dL   Calcium 9.1 8.9 - 57.8 mg/dL   Total Protein 7.0 6.5 - 8.1 g/dL   Albumin 3.8 3.5 - 5.0 g/dL   AST 84 (H) 15 - 41 U/L   ALT 62 (H) 0 - 44 U/L   Alkaline  Phosphatase 250 (H) 38 - 126 U/L   Total Bilirubin 1.5 (H) 0.3 - 1.2 mg/dL   GFR, Estimated >46 >96 mL/min   Anion gap 13 5 - 15  CBC with Differential     Status: Abnormal   Collection Time: 12/19/22  6:40 PM  Result Value Ref Range   WBC 4.4 4.0 - 10.5 K/uL   RBC 4.82 4.22 - 5.81 MIL/uL   Hemoglobin 13.5 13.0 - 17.0 g/dL   HCT 29.5 28.4 - 13.2 %   MCV 87.3 80.0 - 100.0 fL   MCH 28.0 26.0 - 34.0 pg   MCHC 32.1 30.0 - 36.0 g/dL   RDW 44.0 10.2 - 72.5 %   Platelets 173 150 - 400 K/uL   nRBC 0.0 0.0 - 0.2 %   Neutrophils Relative % 84 %   Neutro Abs 3.8 1.7 - 7.7 K/uL   Lymphocytes Relative 5 %   Lymphs Abs 0.2 (L) 0.7 - 4.0 K/uL   Monocytes Relative 8 %   Monocytes Absolute 0.4 0.1 - 1.0 K/uL   Eosinophils Relative 1 %   Eosinophils Absolute 0.0 0.0 - 0.5 K/uL   Basophils Relative 1 %   Basophils Absolute 0.0 0.0 - 0.1 K/uL   Immature Granulocytes 1 %   Abs Immature Granulocytes 0.02 0.00 - 0.07 K/uL  Protime-INR     Status: Abnormal   Collection Time: 12/19/22 10:59 PM  Result Value Ref Range   Prothrombin Time 20.6 (H) 11.4 - 15.2 seconds   INR 1.7 (H) 0.8 - 1.2   CBC: Recent Labs  Lab 12/15/22 1337 12/16/22 0138 12/17/22 0615 12/19/22 1840  WBC 4.7 4.3 5.2 4.4  NEUTROABS  --   --   --  3.8  HGB 13.7 12.3* 12.9* 13.5  HCT 42.4 37.9* 39.2 42.1  MCV 87.4 86.7 86.9 87.3  PLT 171 140* 154 173   Basic Metabolic Panel: Recent Labs  Lab 12/15/22 1337 12/16/22 0138 12/17/22 0615 12/19/22 1840  NA 132* 130* 130* 130*  K 3.7 3.4* 3.6 3.7  CL 97* 96* 97* 93*  CO2 20* 24 24 24   GLUCOSE 95 100* 109* 120*  BUN 7* 7* 10 12  CREATININE 0.70 0.66 0.72 0.86  CALCIUM 8.8* 8.4* 8.7* 9.1   GFR: Estimated Creatinine Clearance: 87.2 mL/min (by C-G formula based on SCr of 0.86 mg/dL). Liver Function Tests: Recent Labs  Lab 12/19/22 1840  AST 84*  ALT 62*  ALKPHOS 250*  BILITOT 1.5*  PROT 7.0  ALBUMIN 3.8   No results for input(s): "LIPASE", "AMYLASE" in the last  168 hours. No results for input(s): "AMMONIA" in the last 168 hours. Coagulation Profile: Recent Labs  Lab 12/15/22 1530 12/19/22 2259  INR 1.3* 1.7*  Cardiac Enzymes: No results for input(s): "CKTOTAL", "CKMB", "CKMBINDEX", "TROPONINI" in the last 168 hours. BNP (last 3 results) No results for input(s): "PROBNP" in the last 8760 hours. HbA1C: No results for input(s): "HGBA1C" in the last 72 hours. CBG: Recent Labs  Lab 12/16/22 0908 12/16/22 1152 12/16/22 1625 12/16/22 2008 12/17/22 0755  GLUCAP 132* 119* 151* 203* 107*   Lipid Profile: No results for input(s): "CHOL", "HDL", "LDLCALC", "TRIG", "CHOLHDL", "LDLDIRECT" in the last 72 hours. Thyroid Function Tests: No results for input(s): "TSH", "T4TOTAL", "FREET4", "T3FREE", "THYROIDAB" in the last 72 hours. Anemia Panel: No results for input(s): "VITAMINB12", "FOLATE", "FERRITIN", "TIBC", "IRON", "RETICCTPCT" in the last 72 hours. Urinalysis    Component Value Date/Time   APPEARANCEUR Clear 10/18/2015 1110   GLUCOSEU Negative 10/18/2015 1110   BILIRUBINUR Negative 10/18/2015 1110   PROTEINUR Negative 10/18/2015 1110   NITRITE Negative 10/18/2015 1110   LEUKOCYTESUR Negative 10/18/2015 1110   Unresulted Labs (From admission, onward)     Start     Ordered   12/20/22 0630  Heparin level (unfractionated)  Once-Timed,   URGENT        12/19/22 2224   12/20/22 0630  APTT  Once-Timed,   STAT        12/19/22 2224   12/20/22 0500  Magnesium  Tomorrow morning,   R        12/19/22 2131   12/20/22 0500  Phosphorus  Tomorrow morning,   R        12/19/22 2131   12/20/22 0500  CBC  Tomorrow morning,   R        12/19/22 2224   12/20/22 0500  Comprehensive metabolic panel  Tomorrow morning,   R        12/19/22 2236   12/19/22 2140  APTT  ONCE - URGENT,   STAT        12/19/22 2151   12/19/22 2140  Heparin level (unfractionated)  ONCE - URGENT,   URGENT        12/19/22 2151   12/19/22 1816  Blood Culture (routine x 2)  (Septic  presentation on arrival (screening labs, nursing and treatment orders for obvious sepsis))  BLOOD CULTURE X 2,   STAT      12/19/22 1822           Medications  heparin bolus via infusion 5,000 Units (0 Units Intravenous Hold 12/19/22 2258)  cefTRIAXone (ROCEPHIN) 2 g in sodium chloride 0.9 % 100 mL IVPB (has no administration in time range)  azithromycin (ZITHROMAX) 500 mg in sodium chloride 0.9 %  250 mL IVPB (has no administration in time range)  vancomycin (VANCOREADY) IVPB 1250 mg/250 mL (has no administration in time range)  acetaminophen (TYLENOL) tablet 650 mg (has no administration in time range)    Or  acetaminophen (TYLENOL) suppository 650 mg (has no administration in time range)  sodium chloride flush (NS) 0.9 % injection 10 mL (10 mLs Intravenous Not Given 12/19/22 2247)  albuterol (PROVENTIL) (2.5 MG/3ML) 0.083% nebulizer solution 2.5 mg (has no administration in time range)  heparin ADULT infusion 100 units/mL (25000 units/244mL) (has no administration in time range)  cefTRIAXone (ROCEPHIN) 2 g in sodium chloride 0.9 % 100 mL IVPB (0 g Intravenous Stopped 12/19/22 1925)  azithromycin (ZITHROMAX) 500 mg in sodium chloride 0.9 % 250 mL IVPB (0 mg Intravenous Stopped 12/19/22 2022)  vancomycin (VANCOCIN) IVPB 1000 mg/200 mL premix (0 mg Intravenous Stopped 12/19/22 2236)    Followed by  vancomycin (VANCOCIN) IVPB 1000 mg/200 mL premix (0 mg Intravenous Stopped 12/19/22 2237)    Radiological Exams on Admission: DG Chest Port 1 View  Result Date: 12/19/2022 CLINICAL DATA:  Shortness of breath, weakness EXAM: PORTABLE CHEST 1 VIEW COMPARISON:  12/16/2022 FINDINGS: Stable cardiomegaly. Slightly increased moderate right pleural effusion and associated airspace opacities. The left lung is clear. No pneumothorax. IMPRESSION: Increased moderate right pleural effusion and associated atelectasis or pneumonia. Electronically Signed   By: Minerva Fester M.D.   On: 12/19/2022 21:58      Data Reviewed: Relevant notes from primary care and specialist visits, past discharge summaries as available in EHR, including Care Everywhere. Prior diagnostic testing as pertinent to current admission diagnoses Updated medications and problem lists for reconciliation ED course, including vitals, labs, imaging, treatment and response to treatment Triage notes, nursing and pharmacy notes and ED provider's notes Notable results as noted in HPI  Assessment and Plan: * SOB (shortness of breath) Supplemental oxygen. O2 use is new.   Pleural effusion on right Pt needs chest tube or pigtail catheter per IR.  Pulmonary consulted and Dr.Dagayli is seeing pt in am for Chest tube.    Pulmonary embolism (HCC) Continue heparin gtt.  Suspect from cancer and Covid.   Mass of upper lobe of right lung Pulmonology consult.   Acute respiratory failure with hypoxia (HCC) SpO2: 96 % O2 Flow Rate (L/min): 2 L/min 2/2 to Covid/ Pleural effusion.   Positive culture finding Pt found to have bacillus on Pleural fluid culture and per pulmonary is broad spectrum coverage.They will see in am and pt will be NPO and IR consult per pulmonary for additional eval.    A-fib (HCC) Hold eliquis and heparin gtt d/w pharmacy about plan.    COVID-19 virus infection Will d/w pharmacy and start remdesivir, October 19th was last tested negative.   Chronic diastolic CHF (congestive heart failure) (HCC) Stable.euvolemic.  Cont heparin.  PRN diuretic therapy.    Type 2 diabetes mellitus with hyperglycemia, without long-term current use of insulin (HCC) Glycemic protocol. Hold glipizide and SSI npo after midnight.   Longstanding persistent atrial fibrillation (HCC) Heparin gtt.  stepdown unit.   HTN (hypertension) Vitals:   12/19/22 1758 12/19/22 1905 12/19/22 2000  BP: (!) 105/58 103/87 (!) 146/92  Continue metoprolol.   Coronary artery disease Stable, continue metoprolol and heparin  gtt.    DVT prophylaxis:  Heparin gtt.   Consults:  Pulmonary: Dr. Aundria Rud.   Advance Care Planning:    Code Status: Full Code   Family Communication:  Wife at bedside.   Disposition  Plan:  Home   Severity of Illness: The appropriate patient status for this patient is INPATIENT. Inpatient status is judged to be reasonable and necessary in order to provide the required intensity of service to ensure the patient's safety. The patient's presenting symptoms, physical exam findings, and initial radiographic and laboratory data in the context of their chronic comorbidities is felt to place them at high risk for further clinical deterioration. Furthermore, it is not anticipated that the patient will be medically stable for discharge from the hospital within 2 midnights of admission.   * I certify that at the point of admission it is my clinical judgment that the patient will require inpatient hospital care spanning beyond 2 midnights from the point of admission due to high intensity of service, high risk for further deterioration and high frequency of surveillance required.*  Author: Gertha Calkin, MD 12/19/2022 11:29 PM  For on call review [email or web address].

## 2022-12-19 NOTE — ED Notes (Signed)
Pt ambulatory to restroom

## 2022-12-19 NOTE — Assessment & Plan Note (Addendum)
Continue heparin gtt.  Suspect from cancer and Covid.

## 2022-12-19 NOTE — ED Triage Notes (Signed)
Pt arrives via POV. PT reports he was told come to the ED to have fluid drained from his right lung. Pt was recently admitted for resp distress, pneumonia and pleural effusion. Pt reports ongoing sob, cough, and generalized weakness. Pt placed on 2lnc in triage. Initial Spo2 was 88% on RA. Pt is pale appearing during triage. PT is AxOX4.

## 2022-12-19 NOTE — Sepsis Progress Note (Signed)
Elink monitoring for the code sepsis protocol.  

## 2022-12-19 NOTE — Assessment & Plan Note (Addendum)
Pt needs chest tube or pigtail catheter per IR.  Pulmonary consulted and Dr.Dagayli is seeing pt in am for Chest tube.

## 2022-12-19 NOTE — Assessment & Plan Note (Signed)
Pt found to have bacillus on Pleural fluid culture and per pulmonary is broad spectrum coverage.They will see in am and pt will be NPO and IR consult per pulmonary for additional eval.

## 2022-12-19 NOTE — Assessment & Plan Note (Signed)
SpO2: 96 % O2 Flow Rate (L/min): 2 L/min 2/2 to Covid/ Pleural effusion.

## 2022-12-19 NOTE — Assessment & Plan Note (Signed)
Stable, continue metoprolol and heparin gtt.

## 2022-12-19 NOTE — ED Provider Notes (Signed)
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  Patient discharged 2 days ago after presenting with shortness of breath.  Family reports that shortly prior to presentation they received a call from the pulmonology office informing them that the patient's culture from his thoracentesis was growing bacteria and it was recommended that they present to the ER for likely chest tube placement and admission.  Patient reports he has had significant fatigue since leaving the hospital with some ongoing shortness of breath, not acutely worsened.  No fevers.  Reviewed his discharge summary from 12/16/2021 4.  At that time, patient presented with progressive shortness of breath and was found to have a large right pleural effusion with right upper lobe mass and subsegmental pulmonary embolism.  He had a thoracentesis performed and was discharged the next day.       Physical Exam   Triage Vital Signs: ED Triage Vitals  Encounter Vitals Group     BP 12/19/22 1758 (!) 105/58     Systolic BP Percentile --      Diastolic BP Percentile --      Pulse Rate 12/19/22 1758 62     Resp 12/19/22 1758 (!) 21     Temp 12/19/22 1758 98.4 F (36.9 C)     Temp src --      SpO2 12/19/22 1758 (!) 88 %     Weight --      Height --      Head Circumference --      Peak Flow --      Pain Score 12/19/22 1801 0     Pain Loc --      Pain Education --      Exclude from Growth Chart --     Most recent vital signs: Vitals:   12/19/22 1905 12/19/22 2000  BP: 103/87 (!) 146/92  Pulse: 96 (!) 109  Resp: 20   Temp:    SpO2: 96% 96%      General: Awake, interactive  CV:  Tachycardic with irregularly irregular rhythm at the time of my initial evaluation Resp:  Significantly diminished lung sounds over the right lung, fair air movement over the left lung, mildly labored respirations with slight tachypnea, on 2 L satting in the upper 90s Abd:  Soft, nontender  Neuro:  Symmetric facial movement, fluid speech   ED Results / Procedures / Treatments   Labs (all labs ordered are listed, but only abnormal results are displayed) Labs Reviewed  RESP PANEL BY RT-PCR (RSV, FLU A&B, COVID)  RVPGX2 - Abnormal; Notable for the following components:      Result Value   SARS Coronavirus 2 by RT PCR POSITIVE (*)    All other components within normal limits  COMPREHENSIVE METABOLIC PANEL - Abnormal; Notable for the following components:   Sodium 130 (*)    Chloride 93 (*)    Glucose, Bld 120 (*)    AST 84 (*)    ALT 62 (*)    Alkaline Phosphatase 250 (*)    Total Bilirubin 1.5 (*)    All other components within normal  limits  CBC WITH DIFFERENTIAL/PLATELET - Abnormal; Notable for the following components:   Lymphs Abs 0.2 (*)    All other components within normal limits  APTT - Abnormal; Notable for the following components:   aPTT 40 (*)    All other components within normal limits  HEPARIN LEVEL (UNFRACTIONATED) - Abnormal; Notable for the following components:   Heparin Unfractionated >1.10 (*)    All other components within normal limits  PROTIME-INR - Abnormal; Notable for the following components:   Prothrombin Time 20.6 (*)    INR 1.7 (*)    All other components within normal limits  CULTURE, BLOOD (ROUTINE X 2)  CULTURE, BLOOD (ROUTINE X 2)  LACTIC ACID, PLASMA  MAGNESIUM  PHOSPHORUS  CBC  HEPARIN LEVEL (UNFRACTIONATED)  APTT  COMPREHENSIVE METABOLIC PANEL     EKG EKG independently reviewed interpreted by myself (ER attending) demonstrates:  EKG demonstrates A-fib at a rate of 119, QRS 96, QTc 415, no  acute ST changes  RADIOLOGY Imaging independently reviewed and interpreted by myself demonstrates:  Chest x-Ragna Kramlich demonstrates recurrent pleural effusion, appears slightly worse than prior x-Dorsel Flinn from 12/16/2022  PROCEDURES:  Critical Care performed: Yes, see critical care procedure note(s)  CRITICAL CARE Performed by: Trinna Post   Total critical care time: 37 minutes  Critical care time was exclusive of separately billable procedures and treating other patients.  Critical care was necessary to treat or prevent imminent or life-threatening deterioration.  Critical care was time spent personally by me on the following activities: development of treatment plan with patient and/or surrogate as well as nursing, discussions with consultants, evaluation of patient's response to treatment, examination of patient, obtaining history from patient or surrogate, ordering and performing treatments and interventions, ordering and review of laboratory studies, ordering and review of radiographic studies, pulse oximetry and re-evaluation of patient's condition.   Procedures   MEDICATIONS ORDERED IN ED: Medications  heparin bolus via infusion 5,000 Units (0 Units Intravenous Hold 12/19/22 2258)  cefTRIAXone (ROCEPHIN) 2 g in sodium chloride 0.9 % 100 mL IVPB (has no administration in time range)  azithromycin (ZITHROMAX) 500 mg in sodium chloride 0.9 % 250 mL IVPB (has no administration in time range)  vancomycin (VANCOREADY) IVPB 1250 mg/250 mL (has no administration in time range)  acetaminophen (TYLENOL) tablet 650 mg (has no administration in time range)    Or  acetaminophen (TYLENOL) suppository 650 mg (has no administration in time range)  sodium chloride flush (NS) 0.9 % injection 10 mL (10 mLs Intravenous Not Given 12/19/22 2247)  albuterol (PROVENTIL) (2.5 MG/3ML) 0.083% nebulizer solution 2.5 mg (has no administration in time range)  heparin ADULT infusion 100 units/mL (25000 units/21mL)  (1,600 Units/hr Intravenous New Bag/Given 12/19/22 2356)  cefTRIAXone (ROCEPHIN) 2 g in sodium chloride 0.9 % 100 mL IVPB (0 g Intravenous Stopped 12/19/22 1925)  azithromycin (ZITHROMAX) 500 mg in sodium chloride 0.9 % 250 mL IVPB (0 mg Intravenous Stopped 12/19/22 2022)  vancomycin (VANCOCIN) IVPB 1000 mg/200 mL premix (0 mg Intravenous Stopped 12/19/22 2236)    Followed by  vancomycin (VANCOCIN) IVPB 1000 mg/200 mL premix (0 mg Intravenous Stopped 12/19/22 2237)     IMPRESSION / MDM / ASSESSMENT AND PLAN / ED COURSE  I reviewed the triage vital signs and the nursing notes.  Differential diagnosis includes, but is not limited to, empyema, parapneumonic effusion, malignancy associated effusion  Patient's presentation is most consistent with acute presentation with potential threat to life or bodily function.  82 year old male presenting  to the emergency department for evaluation of shortness of breath after thoracentesis performed during recent hospitalization grew bacteria.  Discussed with pulmonary as below.  Empiric antibiotics with Rocephin, azithromycin, vancomycin for suspected empyema ordered.  Labs with normal white blood cell count and lactate.  CMP with mild transaminitis, no abdominal pain.  Initially in A-fib with RVR with heart rates up to 130s, but has now improved to 110s, BP stable.  With this, do not feel patient requires ICU admission at this time.  Will reach out to hospitalist team.  Reviewed with Dr. Allena Katz.  She will evaluate the patient for anticipated admission.   Clinical Course as of 12/20/22 0015  Tue Dec 19, 2022  [id number] Case reviewed with Dr. Richardson Dopp. He recommends empiric broad-spectrum antibiotics, holding off on chest tube placement in the ER as the patient is anticoagulated, admission to stepdown versus ICU depending on patient stability. [NR]    Clinical Course User Index [NR] Trinna Post, MD     FINAL CLINICAL IMPRESSION(S) / ED DIAGNOSES   Final  diagnoses:  Pleural effusion  Shortness of breath     Rx / DC Orders   ED Discharge Orders     None        Note:  This document was prepared using Dragon voice recognition software and may include unintentional dictation errors.   Trinna Post, MD 12/20/22 [phone]

## 2022-12-19 NOTE — Assessment & Plan Note (Signed)
Vitals:   12/19/22 1758 12/19/22 1905 12/19/22 2000  BP: (!) 105/58 103/87 (!) 146/92  Continue metoprolol.

## 2022-12-19 NOTE — Assessment & Plan Note (Signed)
Will d/w pharmacy and start remdesivir, October 19th was last tested negative.

## 2022-12-19 NOTE — Consult Note (Signed)
PHARMACY - ANTICOAGULATION CONSULT NOTE  Pharmacy Consult for heparin infusion Indication: atrial fibrillation/PE  Allergies  Allergen Reactions   Ciprofloxacin Nausea Only    Patient Measurements:   Heparin Dosing Weight: 100.6 kg  Vital Signs: Temp: 98.4 F (36.9 C) (10/29 1758) BP: 146/92 (10/29 2000) Pulse Rate: 109 (10/29 2000)  Labs: Recent Labs    12/16/22 2204 12/17/22 0615 12/17/22 0758 12/19/22 1840  HGB  --  12.9*  --  13.5  HCT  --  39.2  --  42.1  PLT  --  154  --  173  APTT 140*  --  122*  --   HEPARINUNFRC  --   --  0.70  --   CREATININE  --  0.72  --  0.86    Estimated Creatinine Clearance: 87.2 mL/min (by C-G formula based on SCr of 0.86 mg/dL).   Medical History: Past Medical History:  Diagnosis Date   A-fib [facility])    Anal fissure    Colon polyp    Hemorrhoid    Hypertension    Murmur    Rectal abscess     Medications:  Patient on Eliquis PTA with last dose 10/29 in the AM  Assessment: 82 yo male told to come to ED due to cytology on pleural fluid.  Patient has PMH including PE and Afib currently on Eliquis.  Pharmacy consulted to start heparin infusion.  Goal of Therapy:  Heparin level 0.3-0.7 units/ml aPTT 66 seconds Monitor platelets by anticoagulation protocol: Yes   Plan:  Give heparin 5000 units IV x 1 Start heparin drip at 1600 units/hr Check aPTT 8 hours after initiation of heparin infusion Adjust based on aPTT until correlation with HL Daily CBC while on heparin  Barrie Folk, PharmD 12/19/2022,9:38 PM

## 2022-12-19 NOTE — Telephone Encounter (Signed)
Appt scheduled for 12/22/2022.

## 2022-12-19 NOTE — Assessment & Plan Note (Signed)
Glycemic protocol. Hold glipizide and SSI npo after midnight.

## 2022-12-19 NOTE — Assessment & Plan Note (Signed)
Pulmonology consult 

## 2022-12-19 NOTE — Assessment & Plan Note (Addendum)
Heparin gtt.  stepdown unit.

## 2022-12-19 NOTE — Consult Note (Signed)
Pharmacy Antibiotic Note  Erik Hendricks is a 82 y.o. male admitted on 12/19/2022 with sepsis.  Pharmacy has been consulted for vancomycin dosing.  Plan: Patient received vancomycin 2000 mg IV x 1 in ED Start vancomycin 1250 mg IV every 12 hours Estimated AUC 479, Cmin 14 IBW, Scr 0.86, Vd 0.72 Will start vancomycin 1250 mg IV every 12 hours Vancomycin levels at steady state or as clinically indicated Azithromycin 500 mg IV every 24 hours per provider Ceftriaxone 2 g IV every 24 hours     Temp (24hrs), Avg:98.4 F (36.9 C), Min:98.4 F (36.9 C), Max:98.4 F (36.9 C)  Recent Labs  Lab 12/15/22 1337 12/15/22 1425 12/16/22 0138 12/17/22 0615 12/19/22 1840  WBC 4.7  --  4.3 5.2 4.4  CREATININE 0.70  --  0.66 0.72 0.86  LATICACIDVEN  --  1.8  --   --  1.8    Estimated Creatinine Clearance: 87.2 mL/min (by C-G formula based on SCr of 0.86 mg/dL).    Allergies  Allergen Reactions   Ciprofloxacin Nausea Only    Antimicrobials this admission: vancomycin 10/29 >>  Ceftriaxone 10/29 >>  Azithromycin 10/29>>  Dose adjustments this admission: N/A  Microbiology results: 10/29 BCx: pending  Thank you for allowing pharmacy to be a part of this patient's care.  Barrie Folk, PharmD 12/19/2022 10:12 PM

## 2022-12-19 NOTE — Consult Note (Signed)
PHARMACY CONSULT NOTE - ELECTROLYTES  Pharmacy Consult for Electrolyte Monitoring and Replacement   Recent Labs:   Estimated Creatinine Clearance: 87.2 mL/min (by C-G formula based on SCr of 0.86 mg/dL). Potassium (mmol/L)  Date Value  12/19/2022 3.7   Calcium (mg/dL)  Date Value  52/84/1324 9.1   Albumin (g/dL)  Date Value  40/11/2723 3.8   Sodium (mmol/L)  Date Value  12/19/2022 130 (L)    Assessment  Erik Hendricks is a 82 y.o. male presenting from outpatient office due to thoracentesis cytology. PMH significant for Afib, HTN, T2DM, HLD, and salivary ductal  . Pharmacy has been consulted to monitor and replace electrolytes.  Diet: NPO MIVF: N/A Pertinent medications: N/A  Goal of Therapy: Electrolytes WNL  Plan:  No replacement indicated at this time Check BMP, Mg, Phos with AM labs  Thank you for allowing pharmacy to be a part of this patient's care.  Barrie Folk, PharmD Clinical Pharmacist 12/19/2022 9:26 PM

## 2022-12-19 NOTE — Progress Notes (Signed)
CODE SEPSIS - PHARMACY COMMUNICATION  **Broad Spectrum Antibiotics should be administered within 1 hour of Sepsis diagnosis**  Time Code Sepsis Called/Page Received: 1822 10/29  Antibiotics Ordered:  Azithromycin 500 mg IV x1 Ceftriaxone 2g IV x 1 Vancomycin 2000 mg IV x1  Time of 1st antibiotic administration: 1855 10/29  Additional action taken by pharmacy: NA  If necessary, Name of Provider/Nurse Contacted: NA  Effie Shy, PharmD Pharmacy Resident  12/19/2022 6:26 PM

## 2022-12-19 NOTE — Assessment & Plan Note (Signed)
Stable.euvolemic.  Cont heparin.  PRN diuretic therapy.

## 2022-12-20 ENCOUNTER — Other Ambulatory Visit: Payer: Self-pay

## 2022-12-20 ENCOUNTER — Encounter: Payer: Self-pay | Admitting: Osteopathic Medicine

## 2022-12-20 DIAGNOSIS — R895 Abnormal microbiological findings in specimens from other organs, systems and tissues: Secondary | ICD-10-CM

## 2022-12-20 DIAGNOSIS — J9601 Acute respiratory failure with hypoxia: Secondary | ICD-10-CM

## 2022-12-20 DIAGNOSIS — J910 Malignant pleural effusion: Secondary | ICD-10-CM | POA: Diagnosis not present

## 2022-12-20 DIAGNOSIS — R0602 Shortness of breath: Secondary | ICD-10-CM | POA: Diagnosis not present

## 2022-12-20 DIAGNOSIS — C3411 Malignant neoplasm of upper lobe, right bronchus or lung: Secondary | ICD-10-CM

## 2022-12-20 DIAGNOSIS — I4811 Longstanding persistent atrial fibrillation: Secondary | ICD-10-CM

## 2022-12-20 DIAGNOSIS — I2693 Single subsegmental pulmonary embolism without acute cor pulmonale: Secondary | ICD-10-CM

## 2022-12-20 DIAGNOSIS — J90 Pleural effusion, not elsewhere classified: Principal | ICD-10-CM | POA: Diagnosis present

## 2022-12-20 LAB — BODY FLUID CULTURE W GRAM STAIN

## 2022-12-20 LAB — COMPREHENSIVE METABOLIC PANEL
ALT: 57 U/L — ABNORMAL HIGH (ref 0–44)
AST: 79 U/L — ABNORMAL HIGH (ref 15–41)
Albumin: 3.2 g/dL — ABNORMAL LOW (ref 3.5–5.0)
Alkaline Phosphatase: 207 U/L — ABNORMAL HIGH (ref 38–126)
Anion gap: 8 (ref 5–15)
BUN: 11 mg/dL (ref 8–23)
CO2: 27 mmol/L (ref 22–32)
Calcium: 8.5 mg/dL — ABNORMAL LOW (ref 8.9–10.3)
Chloride: 97 mmol/L — ABNORMAL LOW (ref 98–111)
Creatinine, Ser: 0.76 mg/dL (ref 0.61–1.24)
GFR, Estimated: >60 mL/min (ref >60)
Glucose, Bld: 91 mg/dL (ref 70–99)
Potassium: 3.3 mmol/L — ABNORMAL LOW (ref 3.5–5.1)
Sodium: 132 mmol/L — ABNORMAL LOW (ref 135–145)
Total Bilirubin: 0.9 mg/dL (ref 0.3–1.2)
Total Protein: 5.9 g/dL — ABNORMAL LOW (ref 6.5–8.1)

## 2022-12-20 LAB — CULTURE, BLOOD (SINGLE)
Culture: NO GROWTH
Culture: NO GROWTH
Special Requests: ADEQUATE
Special Requests: ADEQUATE

## 2022-12-20 LAB — CBC
HCT: 36.5 % — ABNORMAL LOW (ref 39.0–52.0)
Hemoglobin: 11.8 g/dL — ABNORMAL LOW (ref 13.0–17.0)
MCH: 28.2 pg (ref 26.0–34.0)
MCHC: 32.3 g/dL (ref 30.0–36.0)
MCV: 87.1 fL (ref 80.0–100.0)
Platelets: 155 10*3/uL (ref 150–400)
RBC: 4.19 MIL/uL — ABNORMAL LOW (ref 4.22–5.81)
RDW: 15.4 % (ref 11.5–15.5)
WBC: 3.6 10*3/uL — ABNORMAL LOW (ref 4.0–10.5)
nRBC: 0 % (ref 0.0–0.2)

## 2022-12-20 LAB — MAGNESIUM: Magnesium: 1.6 mg/dL — ABNORMAL LOW (ref 1.7–2.4)

## 2022-12-20 LAB — CYTOLOGY - NON PAP

## 2022-12-20 LAB — PHOSPHORUS: Phosphorus: 3.4 mg/dL (ref 2.5–4.6)

## 2022-12-20 LAB — HEPARIN LEVEL (UNFRACTIONATED): Heparin Unfractionated: >1.1 [IU]/mL — ABNORMAL HIGH (ref 0.30–0.70)

## 2022-12-20 LAB — APTT
aPTT: >160 s (ref 24–36)
aPTT: 149 s — ABNORMAL HIGH (ref 24–36)

## 2022-12-20 MED ORDER — POTASSIUM CHLORIDE 10 MEQ/100ML IV SOLN
10.0000 meq | Freq: Once | INTRAVENOUS | Status: AC
  Administered 2022-12-20: 10 meq via INTRAVENOUS

## 2022-12-20 MED ORDER — MAGNESIUM SULFATE 2 GM/50ML IV SOLN
2.0000 g | Freq: Once | INTRAVENOUS | Status: AC
  Administered 2022-12-20: 2 g via INTRAVENOUS
  Filled 2022-12-20: qty 50

## 2022-12-20 MED ORDER — METOPROLOL TARTRATE 5 MG/5ML IV SOLN
5.0000 mg | Freq: Once | INTRAVENOUS | Status: AC
  Administered 2022-12-20: 5 mg via INTRAVENOUS
  Filled 2022-12-20: qty 5

## 2022-12-20 MED ORDER — METOPROLOL TARTRATE 50 MG PO TABS
50.0000 mg | ORAL_TABLET | Freq: Two times a day (BID) | ORAL | Status: DC
  Administered 2022-12-20 – 2022-12-22 (×5): 50 mg via ORAL
  Filled 2022-12-20 (×2): qty 1
  Filled 2022-12-20: qty 2
  Filled 2022-12-20 (×2): qty 1

## 2022-12-20 MED ORDER — METOPROLOL TARTRATE 25 MG PO TABS: 50.0000 mg | ORAL_TABLET | Freq: Two times a day (BID) | ORAL | Status: DC

## 2022-12-20 MED ORDER — ENSURE ENLIVE PO LIQD
237.0000 mL | Freq: Three times a day (TID) | ORAL | Status: DC
  Administered 2022-12-20 – 2022-12-22 (×5): 237 mL via ORAL

## 2022-12-20 MED ORDER — POTASSIUM CHLORIDE 10 MEQ/100ML IV SOLN
10.0000 meq | INTRAVENOUS | Status: AC
  Administered 2022-12-20 (×2): 10 meq via INTRAVENOUS
  Filled 2022-12-20 (×2): qty 100

## 2022-12-20 MED ORDER — PREGABALIN 75 MG PO CAPS
150.0000 mg | ORAL_CAPSULE | Freq: Two times a day (BID) | ORAL | Status: DC
  Administered 2022-12-20 – 2022-12-22 (×5): 150 mg via ORAL
  Filled 2022-12-20 (×3): qty 2
  Filled 2022-12-20: qty 3
  Filled 2022-12-20: qty 2

## 2022-12-20 MED ORDER — DULOXETINE HCL 30 MG PO CPEP
60.0000 mg | ORAL_CAPSULE | Freq: Every day | ORAL | Status: DC
  Administered 2022-12-20 – 2022-12-22 (×3): 60 mg via ORAL
  Filled 2022-12-20: qty 1
  Filled 2022-12-20 (×2): qty 2

## 2022-12-20 MED ORDER — HEPARIN (PORCINE) 25000 UT/250ML-% IV SOLN
700.0000 [IU]/h | INTRAVENOUS | Status: DC
  Administered 2022-12-20: 1000 [IU]/h via INTRAVENOUS

## 2022-12-20 MED ORDER — HEPARIN (PORCINE) 25000 UT/250ML-% IV SOLN
1200.0000 [IU]/h | INTRAVENOUS | Status: DC
  Administered 2022-12-20 (×2): 1200 [IU]/h via INTRAVENOUS
  Filled 2022-12-20: qty 250

## 2022-12-20 MED ORDER — SIMVASTATIN 20 MG PO TABS
20.0000 mg | ORAL_TABLET | Freq: Every day | ORAL | Status: DC
  Administered 2022-12-20 – 2022-12-22 (×3): 20 mg via ORAL
  Filled 2022-12-20: qty 2
  Filled 2022-12-20 (×2): qty 1

## 2022-12-20 MED ORDER — FLUTICASONE PROPIONATE 50 MCG/ACT NA SUSP
1.0000 | Freq: Every day | NASAL | Status: DC
  Administered 2022-12-20 – 2022-12-22 (×3): 1 via NASAL
  Filled 2022-12-20 (×2): qty 16

## 2022-12-20 MED ORDER — NIACIN ER (ANTIHYPERLIPIDEMIC) 500 MG PO TBCR
500.0000 mg | EXTENDED_RELEASE_TABLET | Freq: Every day | ORAL | Status: DC
  Administered 2022-12-20 – 2022-12-22 (×3): 500 mg via ORAL
  Filled 2022-12-20 (×3): qty 1

## 2022-12-20 MED ORDER — BALSALAZIDE DISODIUM 750 MG PO CAPS: 1500.0000 mg | ORAL_CAPSULE | Freq: Two times a day (BID) | ORAL | Status: DC

## 2022-12-20 NOTE — Consult Note (Signed)
PHARMACY - ANTICOAGULATION CONSULT NOTE  Pharmacy Consult for Heparin Infusion Indication: atrial fibrillation/PE  Patient Measurements:   Heparin Dosing Weight: 100.6 kg  Labs: Recent Labs    12/19/22 1840 12/19/22 2259 12/20/22 0623  HGB 13.5  --  11.8*  HCT 42.1  --  36.5*  PLT 173  --  155  APTT  --  40* >160*  LABPROT  --  20.6*  --   INR  --  1.7*  --   HEPARINUNFRC  --  >1.10* >1.10*  CREATININE 0.86  --  0.76    Estimated Creatinine Clearance: 93.8 mL/min (by C-G formula based on SCr of 0.76 mg/dL).  Medical History: Past Medical History:  Diagnosis Date   A-fib [facility])    Anal fissure    Colon polyp    Hemorrhoid    Hypertension    Murmur    Rectal abscess     Medications:  Patient on Eliquis PTA with last dose 10/29 in the AM  Assessment: 82 yo male told to come to ED due to cytology on pleural fluid.  Patient has PMH including PE and Afib currently on Eliquis.  Pharmacy consulted to start heparin infusion.  Baseline heparin level > 1.1, aPTT 40s, INR 1.7. Baseline Hgb 13.5, platelets 173  1030 0623 aPTT > 160s, suprathera; 1600 un/hr  Goal of Therapy:  Heparin level 0.3-0.7 units/ml aPTT 66 seconds Monitor platelets by anticoagulation protocol: Yes   Plan:  --aPTT is supratherapeutic --Hold heparin infusion x 1 hour and decrease heparin infusion rate to 1200 units/hr --Re-check aPTT 8 hours from re-initiation of infusion Follow aPTT until correlation established with heparin level. Re-check heparin level tomorrow AM --Daily CBC per protocol while on IV heparin --Follow-up plan per pulmonology, pending possible chest tube placement 10/30  Tilden Broz B Reola Buckles 12/20/2022,8:20 AM

## 2022-12-20 NOTE — ED Notes (Signed)
Asked ED secretary to order ensure for patient

## 2022-12-20 NOTE — Progress Notes (Signed)
PROGRESS NOTE    Erik Hendricks   ZOX:[id number] DOB: [birth year]  DOA: 12/19/2022 Date of Service: 12/20/22 which is hospital day 1  PCP: [name], MD    HPI: Erik Hendricks is a 82 y.o. 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   Recent hospitalization 10/25-10/27 for R pleural effusion and PE, underwent thoracentesis a/ removal 1.7L on 10/26, started on Eliquis. 10/27 pt reported improvement and desired going home. Effusion cytology still pending, Pulm to follow outpatient.   10/29: pleural fluid results concerning for infection, advised to come to ED by Dr. Larinda Buttery.     Hospital course / significant events:  10/29: to ED as instructed, admitted to hospitalist service w/ pulmonary team to see in AM. Holding Eliquis for likely repeat thoracentesis, starting heparin.  10/30: hold Eliquis, plan chest tube placement tomorrow. Cytology (+)malignancy, c/w metastatic adenocarcinoma of lung origin. Further stains pending to confirm.    Consultants:  Pulmonology  Oncology   Procedures/Surgeries: none      ASSESSMENT & PLAN:   SOB (shortness of breath) Acute hypoxemic respiratory failure  D/e pleural effusion, PE Supplemental oxygen prn Treat underlying cause(s) below    Pleural effusion on right, likely malignant, concern for infectious  Plan chest tube placement tomorrow once off Eliquis 48+h Treat underlying cause(s) below   Mass of upper lobe of right lung (+)Pleural fluid cytology malignancy, likely primary lung cancer  Oncology consult  Confirmatory path stains pending  Discussed with patient and his wife this afternoon, all questions answered   Pulmonary embolism  Continue heparin  gtt.  Suspect from cancer and Covid.    (+)COVID Supportive care   Positive culture finding on pleural fluid, bacillus sp abx, ceftriaxone + azithro + vanc  Pulmonary following for repeat pleural fluid drainage / chest tube placement    A-fib (HCC) Hold eliquis pending chest tube placement  heparin gtt per pharmacy   Chronic diastolic CHF (congestive heart failure)  Stable.euvolemic.  PRN diuretic therapy Continue home beta blocker     Type 2 diabetes mellitus with hyperglycemia, without long-term current use of insulin (HCC) Glycemic protocol. Hold glipizide  SSI        DVT prophylaxis: onheparin gtt  IV fluids: no continuous IV fluids  Nutrition: diabetic/cardiac diet for now, NPO MN pending chest tubes Central lines / invasive devices: none  Code Status: FULL CODE ACP documentation reviewed: none on file in [facility]  TOC needs: TBD Barriers to dispo / significant pending items: chest tube placement              Subjective / Brief ROS:  Patient reports feeling about the same this morning, SOB maybe a little better, he is not needing O2 at this time Denies CP.  Pain controlled.  Denies new weakness.  Tolerating diet.  Reports no concerns w/ urination/defecation.   Family Communication: wife at bedside on rounds     Objective Findings:  Vitals:   12/20/22 1338 12/20/22 1345 12/20/22 1430 12/20/22 1510  BP: 120/82 106/75 93/65   Pulse: (!) 102 (!) 102 82 94  Resp:  (!) 22 (!) 21 18  Temp:      TempSrc:      SpO2:  95% 93% 95%  Height:        Intake/Output Summary (Last 24  hours) at 12/20/2022 1541 Last data filed at 12/20/2022 1439 Gross per 24 hour  Intake 1377.42 ml  Output --  Net 1377.42 ml   There were no vitals filed for this visit.  Examination:  Physical Exam Constitutional:      General: He is not in acute distress. Cardiovascular:     Rate and Rhythm: Normal rate. Rhythm irregular.  Pulmonary:     Effort: Pulmonary  effort is normal.     Breath sounds: Examination of the right-middle field reveals decreased breath sounds. Examination of the right-lower field reveals decreased breath sounds. Decreased breath sounds present.  Skin:    General: Skin is warm and dry.  Neurological:     General: No focal deficit present.     Mental Status: He is alert and oriented to person, place, and time.  Psychiatric:        Mood and Affect: Mood normal.        Behavior: Behavior normal.          Scheduled Medications:   DULoxetine  60 mg Oral Daily   feeding supplement  237 mL Oral TID BM   fluticasone  1 spray Each Nare Daily   metoprolol tartrate  50 mg Oral BID   niacin  500 mg Oral Daily   pregabalin  150 mg Oral BID   simvastatin  20 mg Oral Daily   sodium chloride flush  10 mL Intravenous Q12H    Continuous Infusions:  azithromycin     cefTRIAXone (ROCEPHIN)  IV     heparin 1,200 Units/hr (12/20/22 0936)   potassium chloride 10 mEq (12/20/22 1539)   vancomycin Stopped (12/20/22 1439)    PRN Medications:  acetaminophen **OR** acetaminophen, albuterol  Antimicrobials from admission:  Anti-infectives (From admission, onward)    Start     Dose/Rate Route Frequency Ordered Stop   12/20/22 1900  azithromycin (ZITHROMAX) 500 mg in sodium chloride 0.9 % 250 mL IVPB        500 mg 250 mL/hr over 60 Minutes Intravenous Every 24 hours 12/19/22 2156     12/20/22 1800  cefTRIAXone (ROCEPHIN) 2 g in sodium chloride 0.9 % 100 mL IVPB        2 g 200 mL/hr over 30 Minutes Intravenous Every 24 hours 12/19/22 2156     12/20/22 1100  vancomycin (VANCOREADY) IVPB 1250 mg/250 mL        1,250 mg 166.7 mL/hr over 90 Minutes Intravenous Every 12 hours 12/19/22 2221     12/19/22 2200  nirmatrelvir/ritonavir (PAXLOVID) 3 tablet  Status:  Discontinued        3 tablet Oral 2 times daily 12/19/22 2115 12/19/22 2115   12/19/22 1830  vancomycin (VANCOCIN) IVPB 1000 mg/200 mL premix  Status:  Discontinued         1,000 mg 200 mL/hr over 60 Minutes Intravenous  Once 12/19/22 1822 12/19/22 1829   12/19/22 1830  cefTRIAXone (ROCEPHIN) 2 g in sodium chloride 0.9 % 100 mL IVPB        2 g 200 mL/hr over 30 Minutes Intravenous  Once 12/19/22 1822 12/19/22 1925   12/19/22 1830  azithromycin (ZITHROMAX) 500 mg in sodium chloride 0.9 % 250 mL IVPB        500 mg 250 mL/hr over 60 Minutes Intravenous  Once 12/19/22 1822 12/19/22 2022   12/19/22 1830  vancomycin (VANCOCIN) IVPB 1000 mg/200 mL premix       Placed in "Followed by" Linked Group   1,000  mg 200 mL/hr over 60 Minutes Intravenous  Once 12/19/22 1829 12/19/22 2236   12/19/22 1830  vancomycin (VANCOCIN) IVPB 1000 mg/200 mL premix       Placed in "Followed by" Linked Group   1,000 mg 200 mL/hr over 60 Minutes Intravenous  Once 12/19/22 1829 12/19/22 2237           Data Reviewed:  I have personally reviewed the following...  CBC: Recent Labs  Lab 12/15/22 1337 12/16/22 0138 12/17/22 0615 12/19/22 1840 12/20/22 0623  WBC 4.7 4.3 5.2 4.4 3.6*  NEUTROABS  --   --   --  3.8  --   HGB 13.7 12.3* 12.9* 13.5 11.8*  HCT 42.4 37.9* 39.2 42.1 36.5*  MCV 87.4 86.7 86.9 87.3 87.1  PLT 171 140* 154 173 155   Basic Metabolic Panel: Recent Labs  Lab 12/15/22 1337 12/16/22 0138 12/17/22 0615 12/19/22 1840 12/20/22 0623  NA 132* 130* 130* 130* 132*  K 3.7 3.4* 3.6 3.7 3.3*  CL 97* 96* 97* 93* 97*  CO2 20* 24 24 24 27   GLUCOSE 95 100* 109* 120* 91  BUN 7* 7* 10 12 11   CREATININE 0.70 0.66 0.72 0.86 0.76  CALCIUM 8.8* 8.4* 8.7* 9.1 8.5*  MG  --   --   --   --  1.6*  PHOS  --   --   --   --  3.4   GFR: Estimated Creatinine Clearance: 93.8 mL/min (by C-G formula based on SCr of 0.76 mg/dL). Liver Function Tests: Recent Labs  Lab 12/19/22 1840 12/20/22 0623  AST 84* 79*  ALT 62* 57*  ALKPHOS 250* 207*  BILITOT 1.5* 0.9  PROT 7.0 5.9*  ALBUMIN 3.8 3.2*   No results for input(s): "LIPASE", "AMYLASE" in the last 168 hours. No  results for input(s): "AMMONIA" in the last 168 hours. Coagulation Profile: Recent Labs  Lab 12/15/22 1530 12/19/22 2259  INR 1.3* 1.7*   Cardiac Enzymes: No results for input(s): "CKTOTAL", "CKMB", "CKMBINDEX", "TROPONINI" in the last 168 hours. BNP (last 3 results) No results for input(s): "PROBNP" in the last 8760 hours. HbA1C: No results for input(s): "HGBA1C" in the last 72 hours. CBG: Recent Labs  Lab 12/16/22 0908 12/16/22 1152 12/16/22 1625 12/16/22 2008 12/17/22 0755  GLUCAP 132* 119* 151* 203* 107*   Lipid Profile: No results for input(s): "CHOL", "HDL", "LDLCALC", "TRIG", "CHOLHDL", "LDLDIRECT" in the last 72 hours. Thyroid Function Tests: No results for input(s): "TSH", "T4TOTAL", "FREET4", "T3FREE", "THYROIDAB" in the last 72 hours. Anemia Panel: No results for input(s): "VITAMINB12", "FOLATE", "FERRITIN", "TIBC", "IRON", "RETICCTPCT" in the last 72 hours. Most Recent Urinalysis On File:     Component Value Date/Time   APPEARANCEUR Clear 10/18/2015 1110   GLUCOSEU Negative 10/18/2015 1110   BILIRUBINUR Negative 10/18/2015 1110   PROTEINUR Negative 10/18/2015 1110   NITRITE Negative 10/18/2015 1110   LEUKOCYTESUR Negative 10/18/2015 1110   Sepsis Labs: @LABRCNTIP (procalcitonin:4,lacticidven:4) Microbiology: Recent Results (from the past 240 hour(s))  Blood culture (single)     Status: None   Collection Time: 12/15/22  2:25 PM   Specimen: BLOOD  Result Value Ref Range Status   Specimen Description BLOOD BLOOD RIGHT FOREARM  Final   Special Requests   Final    BOTTLES DRAWN AEROBIC AND ANAEROBIC Blood Culture adequate volume   Culture   Final    NO GROWTH 5 DAYS Performed at [facility], [address], [place], [place] [address]    Report Status 12/20/2022 FINAL  Final  Culture, blood (single)     Status: None   Collection Time: 12/15/22  3:30 PM   Specimen: BLOOD  Result Value Ref Range Status   Specimen Description BLOOD BLOOD RIGHT  WRIST  Final   Special Requests   Final    BOTTLES DRAWN AEROBIC AND ANAEROBIC Blood Culture adequate volume   Culture   Final    NO GROWTH 5 DAYS Performed at [facility], [address], [place], [place] [address]    Report Status 12/20/2022 FINAL  Final  Pleural fluid culture w Gram Stain     Status: None   Collection Time: 12/16/22 12:49 PM   Specimen: Pleural Fluid  Result Value Ref Range Status   Specimen Description   Final    PLEURAL Performed at [facility], [address] [place], [place] [address]    Special Requests   Final    NONE Performed at [facility], [address] [place], [place] [address]    Gram Stain   Final    FEW WBC PRESENT, PREDOMINANTLY MONONUCLEAR NO ORGANISMS SEEN    Culture   Final    RARE BACILLUS SPECIES NOT ANTHRACIS Standardized susceptibility testing for this organism is not available. Performed at [facility], [address], [place], [place] [address]    Report Status 12/20/2022 FINAL  Final  C Difficile Quick Screen w PCR reflex     Status: None   Collection Time: 12/16/22  5:24 PM   Specimen: STOOL  Result Value Ref Range Status   C Diff antigen NEGATIVE NEGATIVE Final   C Diff toxin NEGATIVE NEGATIVE Final   C Diff interpretation No C. difficile detected.  Final    Comment: Performed at [facility], [address] [place], [place] [address]  Blood Culture (routine x 2)     Status: None (Preliminary result)   Collection Time: 12/19/22  6:16 PM   Specimen: BLOOD  Result Value Ref Range Status   Specimen Description BLOOD BLOOD LEFT ARM  Final   Special Requests   Final    BOTTLES DRAWN AEROBIC AND ANAEROBIC Blood Culture results may not be optimal due to an excessive volume of blood received in culture bottles   Culture   Final    NO GROWTH < 24 HOURS Performed at [facility], [address], [place], [place] [address]    Report Status PENDING  Incomplete  Blood  Culture (routine x 2)     Status: None (Preliminary result)   Collection Time: 12/19/22  6:21 PM   Specimen: BLOOD  Result Value Ref Range Status   Specimen Description BLOOD BLOOD RIGHT ARM  Final   Special Requests   Final    BOTTLES DRAWN AEROBIC AND ANAEROBIC Blood Culture results may not be optimal due to an inadequate volume of blood received in culture bottles   Culture   Final    NO GROWTH < 24 HOURS Performed at [facility], [address] [place], [place] [address]    Report Status PENDING  Incomplete  Resp panel by RT-PCR (RSV, Flu A&B, Covid) Anterior Nasal Swab     Status: Abnormal   Collection Time: 12/19/22  6:40 PM   Specimen: Anterior Nasal Swab  Result Value Ref Range Status   SARS Coronavirus 2 by RT PCR POSITIVE (A) NEGATIVE Final    Comment: (NOTE) SARS-CoV-2 target nucleic acids are DETECTED.  The SARS-CoV-2 RNA is generally detectable in upper  respiratory specimens during the acute phase of infection. Positive results are indicative of the presence of the identified virus, but do not rule out bacterial infection or co-infection with other pathogens not detected by the test. Clinical correlation with patient history and other diagnostic information is necessary to determine patient infection status. The expected result is Negative.  Fact Sheet for Patients: BloggerCourse.com  Fact Sheet for Healthcare Providers: SeriousBroker.it  This test is not yet approved or cleared by the [place] FDA and  has been authorized for detection and/or diagnosis of SARS-CoV-2 by FDA under an Emergency Use Authorization (EUA).  This EUA will remain in effect (meaning this test can be used) for the duration of  the COVID-19 declaration under Section 564(b)(1) of the A ct, 21 U.S.C. section 360bbb-3(b)(1), unless the authorization is terminated or revoked sooner.     Influenza A by PCR NEGATIVE NEGATIVE  Final   Influenza B by PCR NEGATIVE NEGATIVE Final    Comment: (NOTE) The Xpert Xpress SARS-CoV-2/FLU/RSV plus assay is intended as an aid in the diagnosis of influenza from Nasopharyngeal swab specimens and should not be used as a sole basis for treatment. Nasal washings and aspirates are unacceptable for Xpert Xpress SARS-CoV-2/FLU/RSV testing.  Fact Sheet for Patients: BloggerCourse.com  Fact Sheet for Healthcare Providers: SeriousBroker.it  This test is not yet approved or cleared by the [place] FDA and has been authorized for detection and/or diagnosis of SARS-CoV-2 by FDA under an Emergency Use Authorization (EUA). This EUA will remain in effect (meaning this test can be used) for the duration of the COVID-19 declaration under Section 564(b)(1) of the Act, 21 U.S.C. section 360bbb-3(b)(1), unless the authorization is terminated or revoked.     Resp Syncytial Virus by PCR NEGATIVE NEGATIVE Final    Comment: (NOTE) Fact Sheet for Patients: BloggerCourse.com  Fact Sheet for Healthcare Providers: SeriousBroker.it  This test is not yet approved or cleared by the [place] FDA and has been authorized for detection and/or diagnosis of SARS-CoV-2 by FDA under an Emergency Use Authorization (EUA). This EUA will remain in effect (meaning this test can be used) for the duration of the COVID-19 declaration under Section 564(b)(1) of the Act, 21 U.S.C. section 360bbb-3(b)(1), unless the authorization is terminated or revoked.  Performed at [facility], [address], [place], [place] [address]       Radiology Studies last 3 days: [facility] Chest [facility] 1 View  Result Date: 12/19/2022 CLINICAL DATA:  Shortness of breath, weakness EXAM: PORTABLE CHEST 1 VIEW COMPARISON:  12/16/2022 FINDINGS: Stable cardiomegaly. Slightly increased moderate right pleural effusion and  associated airspace opacities. The left lung is clear. No pneumothorax. IMPRESSION: Increased moderate right pleural effusion and associated atelectasis or pneumonia. Electronically Signed   By: Minerva Fester M.D.   On: 12/19/2022 21:58   US Venous Img Lower Bilateral (DVT)  Result Date: 12/16/2022 CLINICAL DATA:  Swelling EXAM: BILATERAL LOWER EXTREMITY VENOUS DOPPLER ULTRASOUND TECHNIQUE: Gray-scale sonography with compression, as well as color and duplex ultrasound, were performed to evaluate the deep venous system(s) from the level of the common femoral vein through the popliteal and proximal calf veins. COMPARISON:  03/28/2019 FINDINGS: VENOUS Normal compressibility of the common femoral, superficial femoral, and popliteal veins, as well as the visualized calf veins. Visualized portions of profunda femoral vein and great saphenous vein unremarkable. No filling defects to suggest DVT on grayscale or color Doppler imaging. Doppler waveforms show normal direction of venous flow, normal respiratory plasticity and response  to augmentation. OTHER None. Limitations: none IMPRESSION: 1. No evidence of deep venous thrombosis within either lower extremity. Electronically Signed   By: Sharlet Salina M.D.   On: 12/16/2022 19:59       Time spent: 50 min    Sunnie Nielsen, DO [facility] 12/20/2022, 3:41 PM    Dictation software may have been used to generate the above note. Typos may occur and escape review in typed/dictated notes. Please contact Dr Lyn Hollingshead directly for clarity if needed.  Staff may message me via secure chat in Epic  but this may not receive an immediate response,  please page me for urgent matters!  If 7PM-7AM, please contact night coverage [email or web address]

## 2022-12-20 NOTE — Consult Note (Signed)
NAME: Erik Hendricks  DOB: 1940-11-10  MRN: [id number]  Date/Time: 12/20/2022 4:44 PM  REQUESTING PROVIDER: Dr.Patel Subjective:  REASON FOR CONSULT: empyema ? Erik Hendricks is a 82 y.o. 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  Patient has lost about 10 pounds because of poor appetite.  During that hospitalization he was found to have a large right-sided pleural effusion and a right upper lobe spiculated mass and a small subsegmental PE.  He had pleurocentesis on 12/16/2022 and 1.7 L of fluid was removed.  The cell count was not indicative of infection..  Cytology was sent.  He was discharged home after he was given IV antibiotics in the hospital and then p.o. Augmentin.  For 3 more days. He was asked to come back to the hospital by his pulmonologist because the pleural fluid culture had bacillus organism. I am asked to see the patient for the same.  Patient does not have fever or chills He still has no taste. Appetite is also impaired. Vitals in the ED BP of 146/92, temperature 98.4, pulse 109, sats 96%. WBC 4.4, Hb 13.5, platelet 173, creatinine 0.86. Chest x-ray showed a moderate right pleural effusion and associated atelectasis.  Cytology of the pleural fluid that was sent during last admission is positive for adenocarcinoma with micropapillary features..  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   Past Medical History:  Diagnosis Date   A-fib [facility])    Anal fissure    Colon polyp    Hemorrhoid    Hypertension    Murmur    Rectal abscess     Past Surgical History:  Procedure Laterality Date   ANAL FISTULECTOMY  September 2013   Posterior subcutaneous fistula  treated by fistulotomy.   COLONOSCOPY  2007, 2012   Dr Lemar Livings   COLONOSCOPY WITH PROPOFOL N/A 06/21/2018   Procedure: COLONOSCOPY WITH PROPOFOL;  Surgeon: Christena Deem, MD;  Location: [facility] ENDOSCOPY;  Service: Endoscopy;  Laterality: N/A;   FISSURECTOMY     40 yrs ago   KNEE SURGERY Left 2005   POLYPECTOMY  2007   RECTAL SURGERY  40 yrs ago   cyst    Social History   Socioeconomic History   Marital status: Married    Spouse name: Not on file   Number of children: Not on file   Years of education: Not on file   Highest education level: Not on file  Occupational History   Not on file  Tobacco Use   Smoking status: Former    Types: Cigars   Smokeless tobacco: Never  Substance and Sexual Activity   Alcohol use: No   Drug use: No   Sexual activity: Not on file  Other Topics Concern   Not on file  Social History Narrative   Not on file   Social Determinants of Health   Financial Resource Strain: Patient Declined (08/08/2022)   Received from [facility], [facility]   Overall Financial Resource Strain (CARDIA)    Difficulty of Paying Living Expenses: Patient declined  Food Insecurity: No Food Insecurity (12/20/2022)   Hunger Vital Sign    Worried About Running Out of Food in the  Last Year: Never true    Ran Out of Food in the Last Year: Never true  Transportation Needs: No Transportation Needs (12/20/2022)   PRAPARE - Administrator, Civil Service (Medical): No    Lack of Transportation (Non-Medical): No  Physical Activity: Not on file  Stress: Not on file  Social Connections: Not on file  Intimate Partner Violence: Not At Risk (12/20/2022)   Humiliation, Afraid, Rape, and Kick questionnaire    Fear of Current or Ex-Partner: No    Emotionally Abused: No    Physically Abused: No    Sexually Abused: No    Family History  Problem Relation Age of Onset   Heart Problems Father    Prostate cancer Neg Hx    Bladder  Cancer Neg Hx    Allergies  Allergen Reactions   Ciprofloxacin Nausea Only   I? Current Facility-Administered Medications  Medication Dose Route Frequency Provider Last Rate Last Admin   acetaminophen (TYLENOL) tablet 650 mg  650 mg Oral Q6H PRN Gertha Calkin, MD       Or   acetaminophen (TYLENOL) suppository 650 mg  650 mg Rectal Q6H PRN Gertha Calkin, MD       albuterol (PROVENTIL) (2.5 MG/3ML) 0.083% nebulizer solution 2.5 mg  2.5 mg Nebulization Q6H PRN Gertha Calkin, MD       azithromycin (ZITHROMAX) 500 mg in sodium chloride 0.9 % 250 mL IVPB  500 mg Intravenous Q24H Gertha Calkin, MD       cefTRIAXone (ROCEPHIN) 2 g in sodium chloride 0.9 % 100 mL IVPB  2 g Intravenous Q24H Gertha Calkin, MD       DULoxetine (CYMBALTA) DR capsule 60 mg  60 mg Oral Daily Sunnie Nielsen, DO   60 mg at 12/20/22 1304   feeding supplement (ENSURE ENLIVE / ENSURE PLUS) liquid 237 mL  237 mL Oral TID BM Sunnie Nielsen, DO   237 mL at 12/20/22 1223   fluticasone (FLONASE) 50 MCG/ACT nasal spray 1 spray  1 spray Each Nare Daily Sunnie Nielsen, DO   1 spray at 12/20/22 1306   heparin ADULT infusion 100 units/mL (25000 units/230mL)  1,200 Units/hr Intravenous Continuous Tressie Ellis, RPH 12 mL/hr at 12/20/22 0936 1,200 Units/hr at 12/20/22 0936   metoprolol tartrate (LOPRESSOR) tablet 50 mg  50 mg Oral BID Sunnie Nielsen, DO   50 mg at 12/20/22 1338   niacin (VITAMIN B3) ER tablet 500 mg  500 mg Oral Daily Sunnie Nielsen, DO   500 mg at 12/20/22 1304   pregabalin (LYRICA) capsule 150 mg  150 mg Oral BID Sunnie Nielsen, DO   150 mg at 12/20/22 1304   simvastatin (ZOCOR) tablet 20 mg  20 mg Oral Daily Sunnie Nielsen, DO   20 mg at 12/20/22 1304   sodium chloride flush (NS) 0.9 % injection 10 mL  10 mL Intravenous Q12H Irena Cords V, MD   10 mL at 12/20/22 0936   vancomycin (VANCOREADY) IVPB 1250 mg/250 mL  1,250 mg Intravenous Q12H Barrie Folk, [facility]   Stopped at 12/20/22 1439    Current Outpatient Medications  Medication Sig Dispense Refill   Alpha-Lipoic Acid 600 MG CAPS Take by mouth.     amoxicillin-clavulanate (AUGMENTIN) 875-125 MG tablet Take 1 tablet by mouth 2 (two) times daily for 3 days. 6 tablet 0   apixaban (ELIQUIS) 5 MG TABS tablet Take 2 tablets (10 mg total) by mouth 2 (two) times daily for  7 days, THEN 1 tablet (5 mg total) 2 (two) times daily for 23 days. 74 tablet 0   balsalazide (COLAZAL) 750 MG capsule Take 1,500 mg by mouth 2 (two) times daily.     Cholecalciferol (VITAMIN D-1000 MAX ST) 25 MCG (1000 UT) tablet Take by mouth.     DULoxetine (CYMBALTA) 60 MG capsule Take 60 mg by mouth daily.     fluticasone (FLONASE) 50 MCG/ACT nasal spray Place 1 spray into both nostrils daily.     glipiZIDE (GLUCOTROL XL) 5 MG 24 hr tablet Take 1 tablet by mouth daily.     metoprolol tartrate (LOPRESSOR) 50 MG tablet Take 50 mg by mouth 2 (two) times daily.     niacin (NIASPAN) 500 MG CR tablet Take 500 mg by mouth daily.      pregabalin (LYRICA) 150 MG capsule Take 150 mg by mouth 2 (two) times daily.     simvastatin (ZOCOR) 20 MG tablet Take 20 mg by mouth daily.      feeding supplement (ENSURE ENLIVE / ENSURE PLUS) LIQD Take 237 mLs by mouth 3 (three) times daily between meals. 21330 mL 0     Abtx:  Anti-infectives (From admission, onward)    Start     Dose/Rate Route Frequency Ordered Stop   12/20/22 1900  azithromycin (ZITHROMAX) 500 mg in sodium chloride 0.9 % 250 mL IVPB        500 mg 250 mL/hr over 60 Minutes Intravenous Every 24 hours 12/19/22 2156     12/20/22 1800  cefTRIAXone (ROCEPHIN) 2 g in sodium chloride 0.9 % 100 mL IVPB        2 g 200 mL/hr over 30 Minutes Intravenous Every 24 hours 12/19/22 2156     12/20/22 1100  vancomycin (VANCOREADY) IVPB 1250 mg/250 mL        1,250 mg 166.7 mL/hr over 90 Minutes Intravenous Every 12 hours 12/19/22 2221     12/19/22 2200  nirmatrelvir/ritonavir (PAXLOVID) 3 tablet  Status:  Discontinued         3 tablet Oral 2 times daily 12/19/22 2115 12/19/22 2115   12/19/22 1830  vancomycin (VANCOCIN) IVPB 1000 mg/200 mL premix  Status:  Discontinued        1,000 mg 200 mL/hr over 60 Minutes Intravenous  Once 12/19/22 1822 12/19/22 1829   12/19/22 1830  cefTRIAXone (ROCEPHIN) 2 g in sodium chloride 0.9 % 100 mL IVPB        2 g 200 mL/hr over 30 Minutes Intravenous  Once 12/19/22 1822 12/19/22 1925   12/19/22 1830  azithromycin (ZITHROMAX) 500 mg in sodium chloride 0.9 % 250 mL IVPB        500 mg 250 mL/hr over 60 Minutes Intravenous  Once 12/19/22 1822 12/19/22 2022   12/19/22 1830  vancomycin (VANCOCIN) IVPB 1000 mg/200 mL premix       Placed in "Followed by" Linked Group   1,000 mg 200 mL/hr over 60 Minutes Intravenous  Once 12/19/22 1829 12/19/22 2236   12/19/22 1830  vancomycin (VANCOCIN) IVPB 1000 mg/200 mL premix       Placed in "Followed by" Linked Group   1,000 mg 200 mL/hr over 60 Minutes Intravenous  Once 12/19/22 1829 12/19/22 2237       REVIEW OF SYSTEMS:  Const: negative fever, negative chills, + weight loss Eyes: negative diplopia or visual changes, negative eye pain ENT: negative coryza, negative sore throat Resp: negative cough, hemoptysis, ++dyspnea Cards: negative for chest pain, palpitations, lower  extremity edema GU: negative for frequency, dysuria and hematuria GI: Negative for abdominal pain, diarrhea, bleeding, constipation Skin: negative for rash and pruritus Heme: negative for easy bruising and gum/nose bleeding MS:  weakness Neurolo:negative for headaches, dizziness, vertigo, memory problems  Psych: negative for feelings of anxiety, depression  Endocrine: none Allergy/Immunology-Cipro is not a true allergy and is a side effect which is nausea Objective:  VITALS:  BP 106/72   Pulse 96   Temp 98.7 F (37.1 C)   Resp 20   Ht 6' (1.829 m)   SpO2 91%   BMI 32.44 kg/m   PHYSICAL EXAM:  General: Alert, cooperative, no distress, appears stated age.  pale Head: Normocephalic, without obvious abnormality, atraumatic. Eyes: Conjunctivae clear, anicteric sclerae. Pupils are equal ENT Nares normal. No drainage or sinus tenderness. Lips, mucosa, and tongue normal. No Thrush Neck: Supple, symmetrical, no adenopathy, thyroid: non tender no carotid bruit and no JVD. Back: No CVA tenderness. Lungs: decreased air entry rt side. Heart: Regular rate and rhythm, no murmur, rub or gallop. Abdomen: Soft, non-tender,not distended. Bowel sounds normal. No masses Extremities: atraumatic, no cyanosis. No edema. No clubbing Skin: No rashes or lesions. Or bruising Lymph: Cervical, supraclavicular normal. Neurologic: Grossly non-focal Pertinent Labs Lab Results CBC    Component Value Date/Time   WBC 3.6 (L) 12/20/2022 0623   RBC 4.19 (L) 12/20/2022 0623   HGB 11.8 (L) 12/20/2022 0623   HCT 36.5 (L) 12/20/2022 0623   PLT 155 12/20/2022 0623   MCV 87.1 12/20/2022 0623   MCH 28.2 12/20/2022 0623   MCHC 32.3 12/20/2022 0623   RDW 15.4 12/20/2022 0623   LYMPHSABS 0.2 (L) 12/19/2022 1840   MONOABS 0.4 12/19/2022 1840   EOSABS 0.0 12/19/2022 1840   BASOSABS 0.0 12/19/2022 1840       Latest Ref Rng & Units 12/20/2022    6:23 AM 12/19/2022    6:40 PM 12/17/2022    6:15 AM  CMP  Glucose 70 - 99 mg/dL 91  191  478   BUN 8 - 23 mg/dL 11  12  10    Creatinine 0.61 - 1.24 mg/dL 2.95  6.21  3.08   Sodium 135 - 145 mmol/L 132  130  130   Potassium 3.5 - 5.1 mmol/L 3.3  3.7  3.6   Chloride 98 - 111 mmol/L 97  93  97   CO2 22 - 32 mmol/L 27  24  24    Calcium 8.9 - 10.3 mg/dL 8.5  9.1  8.7   Total Protein 6.5 - 8.1 g/dL 5.9  7.0    Total Bilirubin 0.3 - 1.2 mg/dL 0.9  1.5    Alkaline Phos 38 - 126 U/L 207  250    AST 15 - 41 U/L 79  84    ALT 0 - 44 U/L 57  62        Microbiology: Recent Results (from the past 240 hour(s))  Blood culture (single)     Status: None   Collection Time: 12/15/22  2:25 PM   Specimen: BLOOD  Result Value Ref Range  Status   Specimen Description BLOOD BLOOD RIGHT FOREARM  Final   Special Requests   Final    BOTTLES DRAWN AEROBIC AND ANAEROBIC Blood Culture adequate volume   Culture   Final    NO GROWTH 5 DAYS Performed at [facility], [address] [place], [place] [address]    Report Status 12/20/2022 FINAL  Final  Culture, blood (single)  Status: None   Collection Time: 12/15/22  3:30 PM   Specimen: BLOOD  Result Value Ref Range Status   Specimen Description BLOOD BLOOD RIGHT WRIST  Final   Special Requests   Final    BOTTLES DRAWN AEROBIC AND ANAEROBIC Blood Culture adequate volume   Culture   Final    NO GROWTH 5 DAYS Performed at [facility], [address] [place], [place] [address]    Report Status 12/20/2022 FINAL  Final  Pleural fluid culture w Gram Stain     Status: None   Collection Time: 12/16/22 12:49 PM   Specimen: Pleural Fluid  Result Value Ref Range Status   Specimen Description   Final    PLEURAL Performed at [facility], [address] [place], [place] [address]    Special Requests   Final    NONE Performed at [facility], [address] [place], [place] [address]    Gram Stain   Final    FEW WBC PRESENT, PREDOMINANTLY MONONUCLEAR NO ORGANISMS SEEN    Culture   Final    RARE BACILLUS SPECIES NOT ANTHRACIS Standardized susceptibility testing for this organism is not available. Performed at [facility], [address], [place], [place] [address]    Report Status 12/20/2022 FINAL  Final  C Difficile Quick Screen w PCR reflex     Status: None   Collection Time: 12/16/22  5:24 PM   Specimen: STOOL  Result Value Ref Range Status   C Diff antigen NEGATIVE NEGATIVE Final   C Diff toxin NEGATIVE NEGATIVE Final   C Diff interpretation No C. difficile detected.  Final    Comment: Performed at [facility], [address] [place], [place] [address]  Blood Culture (routine x 2)     Status: None  (Preliminary result)   Collection Time: 12/19/22  6:16 PM   Specimen: BLOOD  Result Value Ref Range Status   Specimen Description BLOOD BLOOD LEFT ARM  Final   Special Requests   Final    BOTTLES DRAWN AEROBIC AND ANAEROBIC Blood Culture results may not be optimal due to an excessive volume of blood received in culture bottles   Culture   Final    NO GROWTH < 24 HOURS Performed at [facility], [address] [place], [place] [address]    Report Status PENDING  Incomplete  Blood Culture (routine x 2)     Status: None (Preliminary result)   Collection Time: 12/19/22  6:21 PM   Specimen: BLOOD  Result Value Ref Range Status   Specimen Description BLOOD BLOOD RIGHT ARM  Final   Special Requests   Final    BOTTLES DRAWN AEROBIC AND ANAEROBIC Blood Culture results may not be optimal due to an inadequate volume of blood received in culture bottles   Culture   Final    NO GROWTH < 24 HOURS Performed at [facility], [address] [place], [place] [address]    Report Status PENDING  Incomplete  Resp panel by RT-PCR (RSV, Flu A&B, Covid) Anterior Nasal Swab     Status: Abnormal   Collection Time: 12/19/22  6:40 PM   Specimen: Anterior Nasal Swab  Result Value Ref Range Status   SARS Coronavirus 2 by RT PCR POSITIVE (A) NEGATIVE Final    Comment: (NOTE) SARS-CoV-2 target nucleic acids are DETECTED.  The SARS-CoV-2 RNA is generally detectable in upper respiratory specimens during the acute phase of infection. Positive results  are indicative of the presence of the identified virus, but do not rule out bacterial infection or co-infection with other pathogens not detected by the test. Clinical correlation with patient history and other diagnostic information is necessary to determine patient infection status. The expected result is Negative.  Fact Sheet for Patients: BloggerCourse.com  Fact Sheet for Healthcare  Providers: SeriousBroker.it  This test is not yet approved or cleared by the [place] FDA and  has been authorized for detection and/or diagnosis of SARS-CoV-2 by FDA under an Emergency Use Authorization (EUA).  This EUA will remain in effect (meaning this test can be used) for the duration of  the COVID-19 declaration under Section 564(b)(1) of the A ct, 21 U.S.C. section 360bbb-3(b)(1), unless the authorization is terminated or revoked sooner.     Influenza A by PCR NEGATIVE NEGATIVE Final   Influenza B by PCR NEGATIVE NEGATIVE Final    Comment: (NOTE) The Xpert Xpress SARS-CoV-2/FLU/RSV plus assay is intended as an aid in the diagnosis of influenza from Nasopharyngeal swab specimens and should not be used as a sole basis for treatment. Nasal washings and aspirates are unacceptable for Xpert Xpress SARS-CoV-2/FLU/RSV testing.  Fact Sheet for Patients: BloggerCourse.com  Fact Sheet for Healthcare Providers: SeriousBroker.it  This test is not yet approved or cleared by the [place] FDA and has been authorized for detection and/or diagnosis of SARS-CoV-2 by FDA under an Emergency Use Authorization (EUA). This EUA will remain in effect (meaning this test can be used) for the duration of the COVID-19 declaration under Section 564(b)(1) of the Act, 21 U.S.C. section 360bbb-3(b)(1), unless the authorization is terminated or revoked.     Resp Syncytial Virus by PCR NEGATIVE NEGATIVE Final    Comment: (NOTE) Fact Sheet for Patients: BloggerCourse.com  Fact Sheet for Healthcare Providers: SeriousBroker.it  This test is not yet approved or cleared by the [place] FDA and has been authorized for detection and/or diagnosis of SARS-CoV-2 by FDA under an Emergency Use Authorization (EUA). This EUA will remain in effect (meaning this test can be  used) for the duration of the COVID-19 declaration under Section 564(b)(1) of the Act, 21 U.S.C. section 360bbb-3(b)(1), unless the authorization is terminated or revoked.  Performed at [facility], [address] [place], [place] [address]   12/16/2022 Pleural fluid WBC 449 Lymphocytes 72% Neutrophils 7% Albumin 2 g LDH 185 Total protein less than 3  IMAGING RESULTS:  I have personally reviewed the films ?rt pleural effusion Impression/Recommendation Malignant right pleural effusion Bacillus ( not anthracis) in  pleural fluid culture is likely a contamination from the skin. As the pleural fluid cell count is not indicative of empyema. Currently no antibiotic is needed Patient is having repeat pleurocentesis tomorrow.  Recommend sending cell count, culture.  Depending on the the results we will decide on antibiotic  Right upper lobe spiculated mass in the lung which is very likely carcinoma  History of left parotid carcinoma status post parotidectomy, reconstruction, radiation and Lupron treatment  Covid +  Anemia  A-fib  ? ________________________________________________ Discussed with patient, wife and care team Note:  This document was prepared using Dragon voice recognition software and may include unintentional dictation errors.

## 2022-12-20 NOTE — Consult Note (Signed)
PHARMACY CONSULT NOTE - ELECTROLYTES  Pharmacy Consult for Electrolyte Monitoring and Replacement   Recent Labs:   Estimated Creatinine Clearance: 93.8 mL/min (by C-G formula based on SCr of 0.76 mg/dL). Potassium (mmol/L)  Date Value  12/20/2022 3.3 (L)   Magnesium (mg/dL)  Date Value  19/14/7829 1.6 (L)   Calcium (mg/dL)  Date Value  56/21/3086 8.5 (L)   Albumin (g/dL)  Date Value  57/84/6962 3.2 (L)   Phosphorus (mg/dL)  Date Value  95/28/4132 3.4   Sodium (mmol/L)  Date Value  12/20/2022 132 (L)   Assessment  Erik Hendricks is a 82 y.o. male presenting from outpatient office due to thoracentesis cytology. PMH significant for Afib, HTN, T2DM, HLD, and salivary ductal  . Pharmacy has been consulted to monitor and replace electrolytes.  Diet: NPO MIVF: N/A Pertinent medications: N/A  Goal of Therapy: Electrolytes WNL  Plan:  --K 3.3, Kcl 10 mEq IV q1h x 3 doses --Mg 1.6, magnesium sulfate 2 g IV x 1 --Re-check BMP, Mg tomorrow AM  Thank you for allowing pharmacy to be a part of this patient's care.  Tressie Ellis 12/20/2022 8:28 AM

## 2022-12-20 NOTE — ED Notes (Signed)
D'cd NPO status per Dr. Lyn Hollingshead

## 2022-12-20 NOTE — Hospital Course (Addendum)
HPI: Erik Hendricks is a 82 y.o. 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   Recent hospitalization 10/25-10/27 for R pleural effusion and PE, underwent thoracentesis a/ removal 1.7L on 10/26, started on Eliquis. 10/27 pt reported improvement and desired going home. Effusion cytology still pending, Pulm to follow outpatient.   10/29: pleural fluid results concerning for infection, advised to come to ED by Dr. Larinda Buttery.     Hospital course / significant events:  10/29: to ED as instructed, admitted to hospitalist service w/ pulmonary team to see in AM. Holding Eliquis for likely repeat thoracentesis, starting heparin.  10/30: hold Eliquis, plan chest tube placement tomorrow. Cytology (+)malignancy, c/w metastatic adenocarcinoma of lung origin. Further stains pending to confirm.    Consultants:  Pulmonology  Oncology   Procedures/Surgeries: none      ASSESSMENT & PLAN:   SOB (shortness of breath) Acute hypoxemic respiratory failure  D/e pleural effusion, PE Supplemental oxygen prn Treat underlying cause(s) below    Pleural effusion on right, likely malignant, concern for infectious  Plan chest tube placement tomorrow once off Eliquis 48+h Treat underlying cause(s) below   Mass of upper lobe of right lung (+)Pleural fluid cytology malignancy, likely primary lung cancer  Oncology consult  Confirmatory path stains pending  Discussed with patient and his wife this afternoon, all questions answered   Pulmonary embolism  Continue heparin gtt.  Suspect from cancer and Covid.    (+)COVID Supportive care   Positive culture finding on pleural fluid, bacillus sp abx, ceftriaxone + azithro + vanc  Pulmonary  following for repeat pleural fluid drainage / chest tube placement    A-fib (HCC) Hold eliquis pending chest tube placement  heparin gtt per pharmacy   Chronic diastolic CHF (congestive heart failure)  Stable.euvolemic.  PRN diuretic therapy Continue home beta blocker     Type 2 diabetes mellitus with hyperglycemia, without long-term current use of insulin (HCC) Glycemic protocol. Hold glipizide  SSI        DVT prophylaxis: onheparin gtt  IV fluids: no continuous IV fluids  Nutrition: diabetic/cardiac diet for now, NPO MN pending chest tubes Central lines / invasive devices: none  Code Status: FULL CODE ACP documentation reviewed: none on file in [facility]  TOC needs: TBD Barriers to dispo / significant pending items: chest tube placement

## 2022-12-20 NOTE — ED Notes (Signed)
Pt given water 

## 2022-12-20 NOTE — ED Notes (Signed)
Messaged provider regarding pt's tachycardia

## 2022-12-20 NOTE — ED Notes (Signed)
Pt asleep in bed at this time, family member left to go home and rest. Pts call bell within reach, cardiac monitor in place.

## 2022-12-20 NOTE — Consult Note (Signed)
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  On presentation, he reported shortness of breath and weight loss. He was found to have a right sided pleural effusion with a subsegmental pulmonary embolism and a right sided lung mass. He underwent thoracentesis on 12/16/2022 with 1.7L of serous fluid removed and improvement in his symptoms. Pleural fluid analysis showed an exudate, and cytology results are back today and show malignancy (TTF-1 positive, enough cells for next gen sequencing) suggestive of NSCLCa (adenoca). Pleural fluid cultures are negative to date, cell counts show lymphocyte predominance, but gram stain was positive for rare bacillus (not anthracis). He was asked by his pulmonologist, Dr. Larinda Buttery, to present to the ED for further evaluation.  On presentation to the ED, he reports weakness and fatigue since discharge. No fevers or chills reported. He was noted to be COVID-19 positive. Besides shortness of breath, he denies any fevers, chills, night sweats, or cough. He doesn't have any sputum production and denies hemoptysis.  Pertinent  Medical History  -stage IV salivary ductal carcinoma, in remission -afib -subsegmental PE -T2Dm -HLD -HTN   Objective   Blood pressure 133/69, pulse 99, temperature 98.7 F (37.1 C), resp. rate (!) 22, height 6' (1.829 m), SpO2 93%.        Intake/Output Summary (Last 24 hours) at 12/20/2022 1149 Last data filed at 12/20/2022 1140 Gross per 24 hour  Intake 1026.31 ml  Output --  Net 1026.31 ml   There were no vitals filed for this  visit.  Examination:  Physical Exam Constitutional:      Appearance: Normal appearance.  Cardiovascular:     Rate and Rhythm: Normal rate and regular rhythm.     Pulses: Normal pulses.     Heart sounds: Normal heart sounds.  Pulmonary:     Effort: Pulmonary effort is normal.     Breath sounds: No wheezing or rhonchi.     Comments: Decreased breath sounds over the right lower lung field, dullness to percussion over the right base. Musculoskeletal:     Right lower leg: Edema present.     Left lower leg: Edema present.  Neurological:     General: No focal deficit present.     Mental Status: He is alert and oriented to person, place, and time.     Motor: Weakness present.      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 Pleural fluid gram stain was positive for bacillus that is concerning for spontaneous bacterial pleuritis, even though cell counts don't suggest an infection. He will require tube thoracostomy for fluid drainage, ruling out infection, and evaluation of lung re-expansion. Patient could be a candidate for pleurodesis if the lung re-expands and pleural surfaces approximate. Would require holding Apixaban for 48 hours prior to pleural intervention, will plan for chest  tube placement tomorrow AM. I don't believe that COVID-19 is contributing to his symptoms at the current time.  -chest tube placement tomorrow -hold apixaban, switch to heparin -consult to oncology -rest of management per primary team  [name], MD  Pulmonary Critical Care  Labs   CBC: Recent Labs  Lab 12/15/22 1337  12/16/22 0138 12/17/22 0615 12/19/22 1840 12/20/22 0623  WBC 4.7 4.3 5.2 4.4 3.6*  NEUTROABS  --   --   --  3.8  --   HGB 13.7 12.3* 12.9* 13.5 11.8*  HCT 42.4 37.9* 39.2 42.1 36.5*  MCV 87.4 86.7 86.9 87.3 87.1  PLT 171 140* 154 173 155    Basic Metabolic Panel: Recent Labs  Lab 12/15/22 1337 12/16/22 0138 12/17/22 0615 12/19/22 1840 12/20/22 0623  NA 132* 130* 130* 130* 132*  K 3.7 3.4* 3.6 3.7 3.3*  CL 97* 96* 97* 93* 97*  CO2 20* 24 24 24 27   GLUCOSE 95 100* 109* 120* 91  BUN 7* 7* 10 12 11   CREATININE 0.70 0.66 0.72 0.86 0.76  CALCIUM 8.8* 8.4* 8.7* 9.1 8.5*  MG  --   --   --   --  1.6*  PHOS  --   --   --   --  3.4   GFR: Estimated Creatinine Clearance: 93.8 mL/min (by C-G formula based on SCr of 0.76 mg/dL). Recent Labs  Lab 12/15/22 1425 12/16/22 0138 12/17/22 0615 12/19/22 1840 12/20/22 0623  WBC  --  4.3 5.2 4.4 3.6*  LATICACIDVEN 1.8  --   --  1.8  --     Liver Function Tests: Recent Labs  Lab 12/19/22 1840 12/20/22 0623  AST 84* 79*  ALT 62* 57*  ALKPHOS 250* 207*  BILITOT 1.5* 0.9  PROT 7.0 5.9*  ALBUMIN 3.8 3.2*   No results for input(s): "LIPASE", "AMYLASE" in the last 168 hours. No results for input(s): "AMMONIA" in the last 168 hours.  ABG No results found for: "PHART", "PCO2ART", "PO2ART", "HCO3", "TCO2", "ACIDBASEDEF", "O2SAT"   Coagulation Profile: Recent Labs  Lab 12/15/22 1530 12/19/22 2259  INR 1.3* 1.7*    Cardiac Enzymes: No results for input(s): "CKTOTAL", "CKMB", "CKMBINDEX", "TROPONINI" in the last 168 hours.  HbA1C: Hgb A1c MFr Bld  Date/Time Value Ref Range Status  12/15/2022 01:37 PM 6.8 (H) 4.8 - 5.6 % Final    Comment:    (NOTE) Pre diabetes:          5.7%-6.4%  Diabetes:              >6.4%  Glycemic control for   <7.0% adults with diabetes     CBG: Recent Labs  Lab 12/16/22 0908 12/16/22 1152 12/16/22 1625 12/16/22 2008 12/17/22 0755  GLUCAP 132* 119* 151* 203* 107*    Review of  Systems:   Review of Systems  Constitutional:  Positive for malaise/fatigue. Negative for chills, fever and weight loss.  Respiratory:  Negative for cough, sputum production, shortness of breath and wheezing.   Cardiovascular:  Negative for chest pain.     Past Medical History:  He,  has a past medical history of A-fib (HCC), Anal fissure, Colon polyp, Hemorrhoid, Hypertension, Murmur, and Rectal abscess.   Surgical History:   Past Surgical History:  Procedure Laterality Date   ANAL FISTULECTOMY  September 2013   Posterior subcutaneous fistula treated by fistulotomy.   COLONOSCOPY  2007, 2012   Dr Lemar Livings   COLONOSCOPY WITH PROPOFOL N/A 06/21/2018   Procedure: COLONOSCOPY  WITH PROPOFOL;  Surgeon: Christena Deem, MD;  Location: [facility] ENDOSCOPY;  Service: Endoscopy;  Laterality: N/A;   FISSURECTOMY     40 yrs ago   KNEE SURGERY Left 2005   POLYPECTOMY  2007   RECTAL SURGERY  40 yrs ago   cyst     Social History:   reports that he has quit smoking. His smoking use included cigars. He has never used smokeless tobacco. He reports that he does not drink alcohol and does not use drugs.   Family History:  His family history includes Heart Problems in his father. There is no history of Prostate cancer or Bladder Cancer.   Allergies Allergies  Allergen Reactions   Ciprofloxacin Nausea Only     Home Medications  Prior to Admission medications   Medication Sig Start Date End Date Taking? Authorizing Provider  Alpha-Lipoic Acid 600 MG CAPS Take by mouth.   Yes [provider]  amoxicillin-clavulanate (AUGMENTIN) 875-125 MG tablet Take 1 tablet by mouth 2 (two) times daily for 3 days. 12/17/22 12/20/22 Yes Wieting, Gerlene Burdock, MD  apixaban (ELIQUIS) 5 MG TABS tablet Take 2 tablets (10 mg total) by mouth 2 (two) times daily for 7 days, THEN 1 tablet (5 mg total) 2 (two) times daily for 23 days. 12/17/22 01/16/23 Yes Wieting, Richard, MD  balsalazide (COLAZAL) 750 MG capsule  Take 1,500 mg by mouth 2 (two) times daily.   Yes [provider]  Cholecalciferol (VITAMIN D-1000 MAX ST) 25 MCG (1000 UT) tablet Take by mouth.   Yes [provider]  DULoxetine (CYMBALTA) 60 MG capsule Take 60 mg by mouth daily.   Yes [provider]  fluticasone (FLONASE) 50 MCG/ACT nasal spray Place 1 spray into both nostrils daily.   Yes [provider]  glipiZIDE (GLUCOTROL XL) 5 MG 24 hr tablet Take 1 tablet by mouth daily. 06/22/22  Yes [provider]  metoprolol tartrate (LOPRESSOR) 50 MG tablet Take 50 mg by mouth 2 (two) times daily. 05/02/18  Yes [provider]  niacin (NIASPAN) 500 MG CR tablet Take 500 mg by mouth daily.    Yes [provider]  pregabalin (LYRICA) 150 MG capsule Take 150 mg by mouth 2 (two) times daily. 04/18/18  Yes [provider]  simvastatin (ZOCOR) 20 MG tablet Take 20 mg by mouth daily.  02/01/18  Yes [provider]  feeding supplement (ENSURE ENLIVE / ENSURE PLUS) LIQD Take 237 mLs by mouth 3 (three) times daily between meals. 12/17/22   Alford Highland, MD     I spent 62 minutes caring for this patient today, including preparing to see the patient, obtaining a medical history , reviewing a separately obtained history, performing a medically appropriate examination and/or evaluation, counseling and educating the patient/family/caregiver, referring and communicating with other health care professionals (not separately reported), documenting clinical information in the electronic health record, and independently interpreting results (not separately reported/billed) and communicating results to the patient/family/caregiver

## 2022-12-20 NOTE — Consult Note (Signed)
PHARMACY - ANTICOAGULATION CONSULT NOTE  Pharmacy Consult for Heparin Infusion Indication: atrial fibrillation/PE  Patient Measurements: Height: 6' (182.9 cm) IBW/kg (Calculated) : 77.6 Heparin Dosing Weight: 100.6 kg  Labs: Recent Labs    12/19/22 1840 12/19/22 2259 12/20/22 0623 12/20/22 1951  HGB 13.5  --  11.8*  --   HCT 42.1  --  36.5*  --   PLT 173  --  155  --   APTT  --  40* >160* 149*  LABPROT  --  20.6*  --   --   INR  --  1.7*  --   --   HEPARINUNFRC  --  >1.10* >1.10*  --   CREATININE 0.86  --  0.76  --     Estimated Creatinine Clearance: 93.8 mL/min (by C-G formula based on SCr of 0.76 mg/dL).  Medical History: Past Medical History:  Diagnosis Date   A-fib [facility])    Anal fissure    Colon polyp    Hemorrhoid    Hypertension    Murmur    Rectal abscess     Medications:  Patient on Eliquis PTA with last dose 10/29 in the AM  Assessment: 82 yo male told to come to ED due to cytology on pleural fluid.  Patient has PMH including PE and Afib currently on Eliquis.  Pharmacy consulted to start heparin infusion.  Baseline heparin level > 1.1, aPTT 40s, INR 1.7. Baseline Hgb 13.5, platelets 173  1030 0623 aPTT > 160s, suprathera; 1600 un/hr 1030 1951 aPTT 149s, supratherapeutic Goal of Therapy:  Heparin level 0.3-0.7 units/ml aPTT 66 seconds Monitor platelets by anticoagulation protocol: Yes   Plan:  --aPTT is supratherapeutic --Hold heparin infusion x 1 hour and decrease heparin infusion rate to 1000 units/hr --Re-check aPTT 8 hours from re-initiation of infusion Follow aPTT until correlation established with heparin level. Re-check heparin level tomorrow AM --Daily CBC per protocol while on IV heparin --Follow-up plan per pulmonology, pending possible chest tube placement 10/31  Barrie Folk, PharmD 12/20/2022,8:23 PM

## 2022-12-21 ENCOUNTER — Inpatient Hospital Stay: Payer: 59

## 2022-12-21 DIAGNOSIS — J90 Pleural effusion, not elsewhere classified: Secondary | ICD-10-CM | POA: Diagnosis not present

## 2022-12-21 DIAGNOSIS — R0602 Shortness of breath: Secondary | ICD-10-CM | POA: Diagnosis not present

## 2022-12-21 LAB — ALBUMIN, PLEURAL OR PERITONEAL FLUID: Albumin, Fluid: <1.5 g/dL

## 2022-12-21 LAB — BODY FLUID CELL COUNT WITH DIFFERENTIAL
Eos, Fluid: 0 %
Lymphs, Fluid: 62 %
Monocyte-Macrophage-Serous Fluid: 31 %
Neutrophil Count, Fluid: 7 %
Total Nucleated Cell Count, Fluid: 848 uL

## 2022-12-21 LAB — APTT
aPTT: 148 s — ABNORMAL HIGH (ref 24–36)
aPTT: 40 s — ABNORMAL HIGH (ref 24–36)

## 2022-12-21 LAB — CBC
HCT: 35.4 % — ABNORMAL LOW (ref 39.0–52.0)
Hemoglobin: 11.9 g/dL — ABNORMAL LOW (ref 13.0–17.0)
MCH: 28.6 pg (ref 26.0–34.0)
MCHC: 33.6 g/dL (ref 30.0–36.0)
MCV: 85.1 fL (ref 80.0–100.0)
Platelets: 158 10*3/uL (ref 150–400)
RBC: 4.16 MIL/uL — ABNORMAL LOW (ref 4.22–5.81)
RDW: 15.3 % (ref 11.5–15.5)
WBC: 3.5 10*3/uL — ABNORMAL LOW (ref 4.0–10.5)
nRBC: 0 % (ref 0.0–0.2)

## 2022-12-21 LAB — BASIC METABOLIC PANEL
Anion gap: 7 (ref 5–15)
BUN: 16 mg/dL (ref 8–23)
CO2: 26 mmol/L (ref 22–32)
Calcium: 8.7 mg/dL — ABNORMAL LOW (ref 8.9–10.3)
Chloride: 97 mmol/L — ABNORMAL LOW (ref 98–111)
Creatinine, Ser: 0.86 mg/dL (ref 0.61–1.24)
GFR, Estimated: >60 mL/min (ref >60)
Glucose, Bld: 133 mg/dL — ABNORMAL HIGH (ref 70–99)
Potassium: 3.8 mmol/L (ref 3.5–5.1)
Sodium: 130 mmol/L — ABNORMAL LOW (ref 135–145)

## 2022-12-21 LAB — PROTEIN, PLEURAL OR PERITONEAL FLUID: Total protein, fluid: <3 g/dL

## 2022-12-21 LAB — MAGNESIUM: Magnesium: 2 mg/dL (ref 1.7–2.4)

## 2022-12-21 LAB — CHOLESTEROL, BODY FLUID: Cholesterol, Fluid: 57 mg/dL

## 2022-12-21 LAB — AMYLASE, PLEURAL OR PERITONEAL FLUID: Amylase, Fluid: 20 U/L

## 2022-12-21 LAB — LACTATE DEHYDROGENASE, PLEURAL OR PERITONEAL FLUID: LD, Fluid: 163 U/L — ABNORMAL HIGH (ref 3–23)

## 2022-12-21 LAB — GLUCOSE, PLEURAL OR PERITONEAL FLUID: Glucose, Fluid: 104 mg/dL

## 2022-12-21 LAB — HEPARIN LEVEL (UNFRACTIONATED): Heparin Unfractionated: >1.1 [IU]/mL — ABNORMAL HIGH (ref 0.30–0.70)

## 2022-12-21 LAB — HIV ANTIBODY (ROUTINE TESTING W REFLEX): HIV Screen 4th Generation wRfx: NONREACTIVE

## 2022-12-21 MED ORDER — HEPARIN (PORCINE) 25000 UT/250ML-% IV SOLN: 700.0000 [IU]/h | INTRAVENOUS | Status: DC

## 2022-12-21 NOTE — Consult Note (Signed)
PHARMACY - ANTICOAGULATION CONSULT NOTE  Pharmacy Consult for Heparin Infusion Indication: atrial fibrillation/PE  Patient Measurements: Height: 6' (182.9 cm) Weight: 110.2 kg (242 lb 15.2 oz) IBW/kg (Calculated) : 77.6 Heparin Dosing Weight: 100.6 kg  Labs: Recent Labs    12/19/22 1840 12/19/22 1840 12/19/22 2259 12/20/22 0623 12/20/22 1951 12/21/22 0532  HGB 13.5  --   --  11.8*  --  11.9*  HCT 42.1  --   --  36.5*  --  35.4*  PLT 173  --   --  155  --  158  APTT  --    < > 40* >160* 149* 148*  LABPROT  --   --  20.6*  --   --   --   INR  --   --  1.7*  --   --   --   HEPARINUNFRC  --   --  >1.10* >1.10*  --  >1.10*  CREATININE 0.86  --   --  0.76  --  0.86   < > = values in this interval not displayed.    Estimated Creatinine Clearance: 87.8 mL/min (by C-G formula based on SCr of 0.86 mg/dL).  Medical History: Past Medical History:  Diagnosis Date   A-fib [facility])    Anal fissure    Colon polyp    Hemorrhoid    Hypertension    Murmur    Rectal abscess     Medications:  Patient on Eliquis PTA with last dose 10/29 in the AM  Assessment: 82 yo male told to come to ED due to cytology on pleural fluid.  Patient has PMH including PE and Afib currently on Eliquis.  Pharmacy consulted to start heparin infusion.  Baseline heparin level > 1.1, aPTT 40s, INR 1.7. Baseline Hgb 13.5, platelets 173  1030 0623 aPTT > 160s, suprathera; 1600 un/hr 1030 1951 aPTT 149s, supratherapeutic Goal of Therapy:  Heparin level 0.3-0.7 units/ml aPTT 66-102 seconds Monitor platelets by anticoagulation protocol: Yes   Plan:  --aPTT is supratherapeutic --Decrease heparin infusion rate to 700 units/hr --Re-check aPTT 8 hours from re-initiation of infusion Follow aPTT until correlation established with heparin level. Re-check heparin level tomorrow AM --Daily CBC per protocol while on IV heparin --Follow-up plan per pulmonology, pending possible chest tube placement 10/31  Otelia Sergeant, PharmD, [facility] 12/21/2022 7:05 AM

## 2022-12-21 NOTE — Progress Notes (Signed)
Transition of Care [facility]) - Inpatient Brief Assessment   Patient Details  Name: Erik Hendricks MRN: [id number] Date of Birth: January 15, 1941  Transition of Care [facility]) CM/SW Contact:    Darolyn Rua, LCSW Phone Number: 12/21/2022, 11:02 AM   Clinical Narrative:  Patient presents to ED for drain of right lung fluid, SOB and generalized weakness, plan for repeat pleurocentesis today. ID following.   PCP: Dr. Burnett Sheng Insurance: Medicare part A and B  No TOC needs noted at this time, please consult TOC should discharge planning needs arise.   Transition of Care Asessment: Insurance and Status: Insurance coverage has been reviewed Patient has primary care physician: Yes Home environment has been reviewed: from home with wie Prior level of function:: independent Prior/Current Home Services: No current home services Social Determinants of Health Reivew: SDOH reviewed no interventions necessary Readmission risk has been reviewed: Yes Transition of care needs: no transition of care needs at this time

## 2022-12-21 NOTE — Procedures (Signed)
Thoracentesis  Procedure Note  Erik Hendricks  [id number]  05/18/1940  Date:12/21/22  Time:3:58 PM   Provider Performing:Saamir Armstrong   Procedure: Thoracentesis with imaging guidance ([id number])  Indication(s) Pleural Effusion  Consent Risks of the procedure as well as the alternatives and risks of each were explained to the patient and/or caregiver.  Consent for the procedure was obtained and is signed in the bedside chart  Anesthesia Topical only with 1% lidocaine    Time Out Verified patient identification, verified procedure, site/side was marked, verified correct patient position, special equipment/implants available, medications/allergies/relevant history reviewed, required imaging and test results available.   Sterile Technique Maximal sterile technique including full sterile barrier drape, hand hygiene, sterile gown, sterile gloves, mask, hair covering, sterile ultrasound probe cover (if used).  Procedure Description Ultrasound was used to identify appropriate pleural anatomy for placement and overlying skin marked.  Area of drainage cleaned and draped in sterile fashion. Lidocaine was used to anesthetize the skin and subcutaneous tissue.  1600 cc's of serosanguinous appearing fluid was drained from the right pleural space. Catheter then removed and bandaid applied to site.   Complications/Tolerance None; patient tolerated the procedure well. Chest X-ray is ordered to confirm no post-procedural complication.   EBL Minimal   Specimen(s) Pleural fluid, will be sent for pleural fluid analysis (cell count, cultures, hematocrit, glucose, LDH, and cholesterol)   Pleural Fluid [place]     Pleural Fluid      Erik Chute, MD [facility] 12/21/2022 3:59 PM

## 2022-12-21 NOTE — Plan of Care (Signed)
  Problem: Education: Goal: Knowledge of General Education information will improve Description Including pain rating scale, medication(s)/side effects and non-pharmacologic comfort measures Outcome: Progressing   Problem: Health Behavior/Discharge Planning: Goal: Ability to manage health-related needs will improve Outcome: Progressing   

## 2022-12-21 NOTE — Progress Notes (Signed)
PROGRESS NOTE    Erik Hendricks   MVH:[id number] DOB: Apr 15, 1940  DOA: 12/19/2022 Date of Service: 12/21/22 which is hospital day 2  PCP: [name], MD     HPI: Erik Hendricks is a 82 y.o. 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    Recent hospitalization 10/25-10/27 for R pleural effusion and PE, underwent thoracentesis a/ removal 1.7L on 10/26, started on Eliquis. 10/27 pt reported improvement and desired going home. Effusion cytology still pending, Pulm to follow outpatient.    10/29: pleural fluid results concerning for infection, advised to come to ED by Dr. Larinda Buttery.        Hospital course / significant events:  10/29: to ED as instructed, admitted to hospitalist service w/ pulmonary team to see in AM. Holding Eliquis for likely repeat thoracentesis, starting heparin.  10/30: hold Eliquis, plan chest tube placement tomorrow. Cytology (+)malignancy, c/w metastatic adenocarcinoma of lung origin. Further stains pending to confirm.  10/31: chest tube placement planned for today      Consultants:  Pulmonology  Oncology    Procedures/Surgeries: none           ASSESSMENT & PLAN:   SOB (shortness of breath) Acute hypoxemic respiratory failure  D/e pleural effusion, PE Supplemental oxygen prn Treat underlying cause(s) below    Pleural effusion on right, likely malignant, concern for infectious  Plan chest tube placement tomorrow once off Eliquis 48+h Treat underlying cause(s) below   Mass of upper lobe of right lung (+)Pleural fluid cytology malignancy, likely primary lung cancer vs metastatic salivary gland cancer recurrence  pathology stains point more to salivary gland primary   Oncology consulted   Pulmonary embolism  Continue heparin gtt - pause for chest tube placement  Suspect from cancer and Covid.  Will consider transition to Eliquis on discharge / per oncology    (+)COVID Supportive care    Positive culture finding on pleural fluid, bacillus sp abx, ceftriaxone + azithro + vanc  Pulmonary following for repeat pleural fluid drainage / chest tube placement    A-fib (HCC) Hold eliquis pending chest tube placement  heparin gtt per pharmacy   Chronic diastolic CHF (congestive heart failure)  Stable.euvolemic.  PRN diuretic therapy Continue home beta blocker     Type 2 diabetes mellitus with hyperglycemia, without long-term current use of insulin (HCC) Glycemic protocol. Hold glipizide  SSI            DVT prophylaxis: onheparin gtt  IV fluids: no continuous IV fluids  Nutrition: diabetic/cardiac diet for now, NPO MN pending chest tubes Central lines / invasive devices: none   Code Status: FULL CODE ACP documentation reviewed: none on file in [facility]   TOC needs: TBD Barriers to dispo / significant pending items: chest tube placement                 Subjective / Brief ROS:  Patient reports feeling better this morning, breathing still okay, coughing more this morning  Denies CP.  Pain controlled.  Denies new weakness.  Tolerating diet.  Reports no concerns w/ urination/defecation.   Family Communication: wife at bedside on rounds     Objective Findings:  Vitals:   12/20/22 2201 12/21/22 0017 12/21/22 0844 12/21/22 1207  BP: 121/78  124/78 103/77  Pulse: 96  88 87  Resp: 20     Temp: 98.3 F (36.8 C)  98.1 F (36.7 C) 97.6 F (36.4 C)  TempSrc:      SpO2: 94%  91% 92%  Weight:  110.2 kg    Height:        Intake/Output Summary (Last 24 hours) at 12/21/2022 1440 Last data filed at 12/21/2022 0000 Gross per 24 hour  Intake 244.33 ml  Output --  Net 244.33 ml   Filed Weights   12/21/22 0017  Weight: 110.2  kg    Examination:  Physical Exam Constitutional:      General: He is not in acute distress. Cardiovascular:     Rate and Rhythm: Normal rate. Rhythm irregular.  Pulmonary:     Effort: Pulmonary effort is normal.     Breath sounds: Examination of the right-middle field reveals decreased breath sounds. Examination of the right-lower field reveals decreased breath sounds. Decreased breath sounds present.  Skin:    General: Skin is warm and dry.  Neurological:     General: No focal deficit present.     Mental Status: He is alert and oriented to person, place, and time.  Psychiatric:        Mood and Affect: Mood normal.        Behavior: Behavior normal.          Scheduled Medications:   DULoxetine  60 mg Oral Daily   feeding supplement  237 mL Oral TID BM   fluticasone  1 spray Each Nare Daily   metoprolol tartrate  50 mg Oral BID   niacin  500 mg Oral Daily   pregabalin  150 mg Oral BID   simvastatin  20 mg Oral Daily   sodium chloride flush  10 mL Intravenous Q12H    Continuous Infusions:  heparin Stopped (12/21/22 1035)    PRN Medications:  acetaminophen **OR** acetaminophen, albuterol  Antimicrobials from admission:  Anti-infectives (From admission, onward)    Start     Dose/Rate Route Frequency Ordered Stop   12/20/22 1900  azithromycin (ZITHROMAX) 500 mg in sodium chloride 0.9 % 250 mL IVPB  Status:  Discontinued        500 mg 250 mL/hr over 60 Minutes Intravenous Every 24 hours 12/19/22 2156 12/20/22 1705   12/20/22 1800  cefTRIAXone (ROCEPHIN) 2 g in sodium chloride 0.9 % 100 mL IVPB  Status:  Discontinued        2 g 200 mL/hr over 30 Minutes Intravenous Every 24 hours 12/19/22 2156 12/20/22 1705   12/20/22 1100  vancomycin (VANCOREADY) IVPB 1250 mg/250 mL  Status:  Discontinued        1,250 mg 166.7 mL/hr over 90 Minutes Intravenous Every 12 hours 12/19/22 2221 12/20/22 1705   12/19/22 2200  nirmatrelvir/ritonavir (PAXLOVID) 3 tablet  Status:   Discontinued        3 tablet Oral 2 times daily 12/19/22 2115 12/19/22 2115   12/19/22 1830  vancomycin (VANCOCIN) IVPB 1000 mg/200 mL premix  Status:  Discontinued        1,000 mg 200 mL/hr over 60 Minutes Intravenous  Once 12/19/22 1822 12/19/22 1829   12/19/22 1830  cefTRIAXone (ROCEPHIN) 2 g in sodium chloride 0.9 % 100 mL IVPB        2 g 200 mL/hr over 30 Minutes Intravenous  Once 12/19/22 1822 12/19/22 1925   12/19/22 1830  azithromycin (ZITHROMAX) 500 mg in sodium chloride 0.9 % 250 mL IVPB        500  mg 250 mL/hr over 60 Minutes Intravenous  Once 12/19/22 1822 12/19/22 2022   12/19/22 1830  vancomycin (VANCOCIN) IVPB 1000 mg/200 mL premix       Placed in "Followed by" Linked Group   1,000 mg 200 mL/hr over 60 Minutes Intravenous  Once 12/19/22 1829 12/19/22 2236   12/19/22 1830  vancomycin (VANCOCIN) IVPB 1000 mg/200 mL premix       Placed in "Followed by" Linked Group   1,000 mg 200 mL/hr over 60 Minutes Intravenous  Once 12/19/22 1829 12/19/22 2237           Data Reviewed:  I have personally reviewed the following...  CBC: Recent Labs  Lab 12/16/22 0138 12/17/22 0615 12/19/22 1840 12/20/22 0623 12/21/22 0532  WBC 4.3 5.2 4.4 3.6* 3.5*  NEUTROABS  --   --  3.8  --   --   HGB 12.3* 12.9* 13.5 11.8* 11.9*  HCT 37.9* 39.2 42.1 36.5* 35.4*  MCV 86.7 86.9 87.3 87.1 85.1  PLT 140* 154 173 155 158   Basic Metabolic Panel: Recent Labs  Lab 12/16/22 0138 12/17/22 0615 12/19/22 1840 12/20/22 0623 12/21/22 0532  NA 130* 130* 130* 132* 130*  K 3.4* 3.6 3.7 3.3* 3.8  CL 96* 97* 93* 97* 97*  CO2 24 24 24 27 26   GLUCOSE 100* 109* 120* 91 133*  BUN 7* 10 12 11 16   CREATININE 0.66 0.72 0.86 0.76 0.86  CALCIUM 8.4* 8.7* 9.1 8.5* 8.7*  MG  --   --   --  1.6* 2.0  PHOS  --   --   --  3.4  --    GFR: Estimated Creatinine Clearance: 87.8 mL/min (by C-G formula based on SCr of 0.86 mg/dL). Liver Function Tests: Recent Labs  Lab 12/19/22 1840 12/20/22 0623  AST  84* 79*  ALT 62* 57*  ALKPHOS 250* 207*  BILITOT 1.5* 0.9  PROT 7.0 5.9*  ALBUMIN 3.8 3.2*   No results for input(s): "LIPASE", "AMYLASE" in the last 168 hours. No results for input(s): "AMMONIA" in the last 168 hours. Coagulation Profile: Recent Labs  Lab 12/15/22 1530 12/19/22 2259  INR 1.3* 1.7*   Cardiac Enzymes: No results for input(s): "CKTOTAL", "CKMB", "CKMBINDEX", "TROPONINI" in the last 168 hours. BNP (last 3 results) No results for input(s): "PROBNP" in the last 8760 hours. HbA1C: No results for input(s): "HGBA1C" in the last 72 hours. CBG: Recent Labs  Lab 12/16/22 0908 12/16/22 1152 12/16/22 1625 12/16/22 2008 12/17/22 0755  GLUCAP 132* 119* 151* 203* 107*   Lipid Profile: No results for input(s): "CHOL", "HDL", "LDLCALC", "TRIG", "CHOLHDL", "LDLDIRECT" in the last 72 hours. Thyroid Function Tests: No results for input(s): "TSH", "T4TOTAL", "FREET4", "T3FREE", "THYROIDAB" in the last 72 hours. Anemia Panel: No results for input(s): "VITAMINB12", "FOLATE", "FERRITIN", "TIBC", "IRON", "RETICCTPCT" in the last 72 hours. Most Recent Urinalysis On File:     Component Value Date/Time   APPEARANCEUR Clear 10/18/2015 1110   GLUCOSEU Negative 10/18/2015 1110   BILIRUBINUR Negative 10/18/2015 1110   PROTEINUR Negative 10/18/2015 1110   NITRITE Negative 10/18/2015 1110   LEUKOCYTESUR Negative 10/18/2015 1110   Sepsis Labs: @LABRCNTIP (procalcitonin:4,lacticidven:4) Microbiology: Recent Results (from the past 240 hour(s))  Blood culture (single)     Status: None   Collection Time: 12/15/22  2:25 PM   Specimen: BLOOD  Result Value Ref Range Status   Specimen Description BLOOD BLOOD RIGHT FOREARM  Final   Special Requests   Final    BOTTLES DRAWN AEROBIC  AND ANAEROBIC Blood Culture adequate volume   Culture   Final    NO GROWTH 5 DAYS Performed at [facility], [address] [place], [place] [address]    Report Status 12/20/2022 FINAL  Final   Culture, blood (single)     Status: None   Collection Time: 12/15/22  3:30 PM   Specimen: BLOOD  Result Value Ref Range Status   Specimen Description BLOOD BLOOD RIGHT WRIST  Final   Special Requests   Final    BOTTLES DRAWN AEROBIC AND ANAEROBIC Blood Culture adequate volume   Culture   Final    NO GROWTH 5 DAYS Performed at [facility], [address] [place], [place] [address]    Report Status 12/20/2022 FINAL  Final  Pleural fluid culture w Gram Stain     Status: None   Collection Time: 12/16/22 12:49 PM   Specimen: Pleural Fluid  Result Value Ref Range Status   Specimen Description   Final    PLEURAL Performed at [facility], [address] [place], [place] [address]    Special Requests   Final    NONE Performed at [facility], [address] [place], [place] [address]    Gram Stain   Final    FEW WBC PRESENT, PREDOMINANTLY MONONUCLEAR NO ORGANISMS SEEN    Culture   Final    RARE BACILLUS SPECIES NOT ANTHRACIS Standardized susceptibility testing for this organism is not available. Performed at [facility], [address] [place], [place] [address]    Report Status 12/20/2022 FINAL  Final  C Difficile Quick Screen w PCR reflex     Status: None   Collection Time: 12/16/22  5:24 PM   Specimen: STOOL  Result Value Ref Range Status   C Diff antigen NEGATIVE NEGATIVE Final   C Diff toxin NEGATIVE NEGATIVE Final   C Diff interpretation No C. difficile detected.  Final    Comment: Performed at [facility], [address] [place], [place] [address]  Blood Culture (routine x 2)     Status: None (Preliminary result)   Collection Time: 12/19/22  6:16 PM   Specimen: BLOOD  Result Value Ref Range Status   Specimen Description BLOOD BLOOD LEFT ARM  Final   Special Requests   Final    BOTTLES DRAWN AEROBIC AND ANAEROBIC Blood Culture results may not be optimal due to an excessive volume of blood received in culture bottles    Culture   Final    NO GROWTH 2 DAYS Performed at [facility], [address] [place], [place] [address]    Report Status PENDING  Incomplete  Blood Culture (routine x 2)     Status: None (Preliminary result)   Collection Time: 12/19/22  6:21 PM   Specimen: BLOOD  Result Value Ref Range Status   Specimen Description BLOOD BLOOD RIGHT ARM  Final   Special Requests   Final    BOTTLES DRAWN AEROBIC AND ANAEROBIC Blood Culture results may not be optimal due to an inadequate volume of blood received in culture bottles   Culture   Final    NO GROWTH 2 DAYS Performed at [facility], [address] [place], [place] [address]    Report Status PENDING  Incomplete  Resp panel by RT-PCR (RSV, Flu A&B, Covid) Anterior Nasal Swab     Status: Abnormal   Collection Time: 12/19/22  6:40 PM   Specimen: Anterior Nasal Swab  Result Value Ref Range Status   SARS Coronavirus 2 by RT PCR POSITIVE (A) NEGATIVE Final    Comment: (NOTE) SARS-CoV-2 target nucleic acids are DETECTED.  The SARS-CoV-2 RNA is generally detectable in upper respiratory specimens during the acute phase of infection. Positive results are indicative of the presence of the identified virus, but do not rule out bacterial infection or co-infection with other pathogens not detected by the test. Clinical correlation with patient history and other diagnostic information is necessary to determine patient infection status. The expected result is Negative.  Fact Sheet for Patients: BloggerCourse.com  Fact Sheet for Healthcare Providers: SeriousBroker.it  This test is not yet approved or cleared by the [place] FDA and  has been authorized for detection and/or diagnosis of SARS-CoV-2 by FDA under an Emergency Use Authorization (EUA).  This EUA will remain in effect (meaning this test can be used) for the duration of  the COVID-19 declaration under Section  564(b)(1) of the A ct, 21 U.S.C. section 360bbb-3(b)(1), unless the authorization is terminated or revoked sooner.     Influenza A by PCR NEGATIVE NEGATIVE Final   Influenza B by PCR NEGATIVE NEGATIVE Final    Comment: (NOTE) The Xpert Xpress SARS-CoV-2/FLU/RSV plus assay is intended as an aid in the diagnosis of influenza from Nasopharyngeal swab specimens and should not be used as a sole basis for treatment. Nasal washings and aspirates are unacceptable for Xpert Xpress SARS-CoV-2/FLU/RSV testing.  Fact Sheet for Patients: BloggerCourse.com  Fact Sheet for Healthcare Providers: SeriousBroker.it  This test is not yet approved or cleared by the [place] FDA and has been authorized for detection and/or diagnosis of SARS-CoV-2 by FDA under an Emergency Use Authorization (EUA). This EUA will remain in effect (meaning this test can be used) for the duration of the COVID-19 declaration under Section 564(b)(1) of the Act, 21 U.S.C. section 360bbb-3(b)(1), unless the authorization is terminated or revoked.     Resp Syncytial Virus by PCR NEGATIVE NEGATIVE Final    Comment: (NOTE) Fact Sheet for Patients: BloggerCourse.com  Fact Sheet for Healthcare Providers: SeriousBroker.it  This test is not yet approved or cleared by the [place] FDA and has been authorized for detection and/or diagnosis of SARS-CoV-2 by FDA under an Emergency Use Authorization (EUA). This EUA will remain in effect (meaning this test can be used) for the duration of the COVID-19 declaration under Section 564(b)(1) of the Act, 21 U.S.C. section 360bbb-3(b)(1), unless the authorization is terminated or revoked.  Performed at [facility], [address], [place], [place] [address]       Radiology Studies last 3 days: [facility] 1 View  Result Date: 12/19/2022 CLINICAL DATA:   Shortness of breath, weakness EXAM: PORTABLE CHEST 1 VIEW COMPARISON:  12/16/2022 FINDINGS: Stable cardiomegaly. Slightly increased moderate right pleural effusion and associated airspace opacities. The left lung is clear. No pneumothorax. IMPRESSION: Increased moderate right pleural effusion and associated atelectasis or pneumonia. Electronically Signed   By: Minerva Fester M.D.   On: 12/19/2022 21:58       Time spent: 50 min    Sunnie Nielsen, DO [facility] 12/21/2022, 2:40 PM    Dictation software may have been used to generate the above note. Typos may occur and escape review in typed/dictated notes. Please contact Dr Lyn Hollingshead directly for clarity if needed.  Staff may message me via secure chat in Epic  but this may not receive an immediate response,  please page me for urgent matters!  If 7PM-7AM, please contact night coverage [email or web address]

## 2022-12-21 NOTE — Consult Note (Signed)
PHARMACY CONSULT NOTE - ELECTROLYTES  Pharmacy Consult for Electrolyte Monitoring and Replacement   Recent Labs: Height: 6' (182.9 cm) Weight: 110.2 kg (242 lb 15.2 oz) IBW/kg (Calculated) : 77.6 Estimated Creatinine Clearance: 87.8 mL/min (by C-G formula based on SCr of 0.86 mg/dL). Potassium (mmol/L)  Date Value  12/21/2022 3.8   Magnesium (mg/dL)  Date Value  95/62/1308 2.0   Calcium (mg/dL)  Date Value  65/78/4696 8.7 (L)   Albumin (g/dL)  Date Value  29/52/8413 3.2 (L)   Phosphorus (mg/dL)  Date Value  24/40/1027 3.4   Sodium (mmol/L)  Date Value  12/21/2022 130 (L)   Assessment  Erik Hendricks is a 82 y.o. male presenting from outpatient office due to thoracentesis cytology. PMH significant for Afib, HTN, T2DM, HLD, and salivary ductal  . Pharmacy has been consulted to monitor and replace electrolytes.  Diet: NPO MIVF: N/A Pertinent medications: N/A  Goal of Therapy: Electrolytes WNL  Plan:  --K 3.8 and Mg 2 after replacement on 10/31, no additional electrolytes at this time. --Re-check BMP tomorrow AM  Thank you for allowing pharmacy to be a part of this patient's care.  Antion Andres Rodriguez-Guzman PharmD, BCPS 12/21/2022 8:09 AM

## 2022-12-21 NOTE — Consult Note (Signed)
PHARMACY - ANTICOAGULATION CONSULT NOTE  Pharmacy Consult for Heparin Infusion Indication: atrial fibrillation/PE  Patient Measurements: Height: 6' (182.9 cm) Weight: 110.2 kg (242 lb 15.2 oz) IBW/kg (Calculated) : 77.6 Heparin Dosing Weight: 100.6 kg  Labs: Recent Labs    12/19/22 1840 12/19/22 1840 12/19/22 2259 12/20/22 0623 12/20/22 1951 12/21/22 0532 12/21/22 1458  HGB 13.5  --   --  11.8*  --  11.9*  --   HCT 42.1  --   --  36.5*  --  35.4*  --   PLT 173  --   --  155  --  158  --   APTT  --    < > 40* >160* 149* 148* 40*  LABPROT  --   --  20.6*  --   --   --   --   INR  --   --  1.7*  --   --   --   --   HEPARINUNFRC  --   --  >1.10* >1.10*  --  >1.10*  --   CREATININE 0.86  --   --  0.76  --  0.86  --    < > = values in this interval not displayed.    Estimated Creatinine Clearance: 87.8 mL/min (by C-G formula based on SCr of 0.86 mg/dL).  Medical History: Past Medical History:  Diagnosis Date   A-fib [facility])    Anal fissure    Colon polyp    Hemorrhoid    Hypertension    Murmur    Rectal abscess     Medications:  Patient on Eliquis PTA with last dose 10/29 in the AM  Assessment: 82 yo male told to come to ED due to cytology on pleural fluid.  Patient has PMH including PE and Afib currently on Eliquis.  Pharmacy consulted to start heparin infusion.  Baseline heparin level > 1.1, aPTT 40s, INR 1.7. Baseline Hgb 13.5, platelets 173  Goal of Therapy:  Heparin level 0.3-0.7 units/ml aPTT 66-102 seconds Monitor platelets by anticoagulation protocol: Yes   Date Time Results Comments 1030 0623  aPTT >160 Supra-therapeutic at 1600 un/hr 1030 1951  aPTT 149  Supra-therapeutic  10/31 0532 aPTT 148 Supra-therapeutic 10/31 1458 aPTT 40 Sub-therapeutic  - HELD @1035  for chest tube insertion  Plan:  Heparin currently on HOLD since 1035 for chest tube placement Plan to resume heparin infusion at 700u/hr once okay by provider and repeat HL 8 after  after. Follow aPTT until correlation established with heparin level.  Daily CBC per protocol while on IV heparin  Annalisia Ingber Rodriguez-Guzman PharmD, BCPS 12/21/2022 3:25 PM

## 2022-12-21 NOTE — Hospital Course (Signed)
  HPI: Erik Hendricks is a 82 y.o. 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    Recent hospitalization 10/25-10/27 for R pleural effusion and PE, underwent thoracentesis a/ removal 1.7L on 10/26, started on Eliquis. 10/27 pt reported improvement and desired going home. Effusion cytology still pending, Pulm to follow outpatient.    10/29: pleural fluid results concerning for infection, advised to come to ED by Dr. Larinda Buttery.        Hospital course / significant events:  10/29: to ED as instructed, admitted to hospitalist service w/ pulmonary team to see in AM. Holding Eliquis for likely repeat thoracentesis, starting heparin.  10/30: hold Eliquis, plan chest tube placement tomorrow. Cytology (+)malignancy, c/w metastatic adenocarcinoma of lung origin. Further stains pending to confirm.  10/31: chest tube placement planned for today      Consultants:  Pulmonology  Oncology    Procedures/Surgeries: none           ASSESSMENT & PLAN:   SOB (shortness of breath) Acute hypoxemic respiratory failure  D/e pleural effusion, PE Supplemental oxygen prn Treat underlying cause(s) below    Pleural effusion on right, likely malignant, concern for infectious  Plan chest tube placement tomorrow once off Eliquis 48+h Treat underlying cause(s) below   Mass of upper lobe of right lung (+)Pleural fluid cytology malignancy, likely primary lung cancer vs metastatic salivary gland cancer recurrence  pathology stains point more to salivary gland primary  Oncology consulted   Pulmonary embolism  Continue heparin gtt - pause for chest tube placement  Suspect from cancer and Covid.  Will consider transition to Eliquis on  discharge / per oncology    (+)COVID Supportive care    Positive culture finding on pleural fluid, bacillus sp abx, ceftriaxone + azithro + vanc  Pulmonary following for repeat pleural fluid drainage / chest tube placement    A-fib (HCC) Hold eliquis pending chest tube placement  heparin gtt per pharmacy   Chronic diastolic CHF (congestive heart failure)  Stable.euvolemic.  PRN diuretic therapy Continue home beta blocker     Type 2 diabetes mellitus with hyperglycemia, without long-term current use of insulin (HCC) Glycemic protocol. Hold glipizide  SSI            DVT prophylaxis: onheparin gtt  IV fluids: no continuous IV fluids  Nutrition: diabetic/cardiac diet for now, NPO MN pending chest tubes Central lines / invasive devices: none   Code Status: FULL CODE ACP documentation reviewed: none on file in [facility]YNCA   TOC needs: TBD Barriers to dispo / significant pending items: chest tube placement

## 2022-12-22 ENCOUNTER — Other Ambulatory Visit: Payer: Self-pay | Admitting: Pathology

## 2022-12-22 ENCOUNTER — Inpatient Hospital Stay: Payer: Medicare Other | Admitting: Pulmonary Disease

## 2022-12-22 DIAGNOSIS — R0602 Shortness of breath: Secondary | ICD-10-CM | POA: Diagnosis not present

## 2022-12-22 LAB — CBC
HCT: 36.4 % — ABNORMAL LOW (ref 39.0–52.0)
Hemoglobin: 12 g/dL — ABNORMAL LOW (ref 13.0–17.0)
MCH: 28.4 pg (ref 26.0–34.0)
MCHC: 33 g/dL (ref 30.0–36.0)
MCV: 86.3 fL (ref 80.0–100.0)
Platelets: 169 10*3/uL (ref 150–400)
RBC: 4.22 MIL/uL (ref 4.22–5.81)
RDW: 15.4 % (ref 11.5–15.5)
WBC: 4.2 10*3/uL (ref 4.0–10.5)
nRBC: 0 % (ref 0.0–0.2)

## 2022-12-22 LAB — PATHOLOGIST SMEAR REVIEW

## 2022-12-22 LAB — BASIC METABOLIC PANEL
Anion gap: 10 (ref 5–15)
BUN: 18 mg/dL (ref 8–23)
CO2: 25 mmol/L (ref 22–32)
Calcium: 8.8 mg/dL — ABNORMAL LOW (ref 8.9–10.3)
Chloride: 95 mmol/L — ABNORMAL LOW (ref 98–111)
Creatinine, Ser: 0.77 mg/dL (ref 0.61–1.24)
GFR, Estimated: >60 mL/min (ref >60)
Glucose, Bld: 160 mg/dL — ABNORMAL HIGH (ref 70–99)
Potassium: 3.8 mmol/L (ref 3.5–5.1)
Sodium: 130 mmol/L — ABNORMAL LOW (ref 135–145)

## 2022-12-22 MED ORDER — APIXABAN 5 MG PO TABS: 5.0000 mg | ORAL_TABLET | Freq: Two times a day (BID) | ORAL | Status: DC

## 2022-12-22 NOTE — Progress Notes (Signed)
Brief Progress Note:  Patient underwent thoracentesis yesterday to evaluate for potential spontaneous bacterial pleuritis. Fluid analysis shows it to be exudative, with cell counts showing lymphocytes; gram stain is negative, and culture is no growth to date. This is overall not concerning for infection. While the fluid is serosanguinous, hemoglobin has been stable without any change in hemodynamics, and this is likely secondary to his anti-coagulation. Recommend holding anti-coagulation for now and restarting this afternoon at 5 mg of apixaban bid. Patient ok to discharge and follow up outpatient with Dr. Larinda Buttery.  [name], MD [facility] 12/22/2022 7:34 AM

## 2022-12-22 NOTE — Discharge Summary (Signed)
Physician Discharge Summary   Patient: Erik Hendricks MRN: [id number]  DOB: 1941/02/17   Admit:     Date of Admission: 12/19/2022 Admitted from: home   Discharge: Date of discharge: 12/22/22 Disposition: Home Condition at discharge: good  CODE STATUS: FULL CODE     Discharge Physician: Sunnie Nielsen, DO [facility]     PCP: [name], MD  Recommendations for Outpatient Follow-up:  Follow up with PCP [name], MD in 1-2 weeks Follow up with pulmonary as scheduled  Follow up with oncology to be arranged    Discharge Instructions     Call MD for:  redness, tenderness, or signs of infection (pain, swelling, redness, odor or green/yellow discharge around incision site)   Complete by: As directed    Call MD for:  severe uncontrolled pain   Complete by: As directed    Call MD for:  temperature >100.4   Complete by: As directed    Diet - low sodium heart healthy   Complete by: As directed    Discharge wound care:   Complete by: As directed    Keep site of thoracentesis clean/dry, change bandage daily or as needed if soiled, do not soak but ok to shower   Increase activity slowly   Complete by: As directed          Discharge Diagnoses: Principal Problem:   SOB (shortness of breath) Active Problems:   Pleural effusion on right   Pulmonary embolism (HCC)   Coronary artery disease   HTN (hypertension)   Longstanding persistent atrial fibrillation (HCC)   Type 2 diabetes mellitus with hyperglycemia, without long-term current use of insulin (HCC)   Chronic diastolic CHF (congestive heart failure) (HCC)   COVID-19 virus infection   A-fib (HCC)   Positive culture finding   Acute respiratory failure with hypoxia (HCC)   Pleural effusion   Malignant pleural effusion        HPI: Erik Hendricks is a 82 y.o. 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    Recent hospitalization 10/25-10/27 for R pleural effusion and PE, underwent thoracentesis a/ removal 1.7L on 10/26, started on Eliquis. 10/27 pt reported improvement and desired going home. Effusion cytology still pending, Pulm to follow outpatient.    10/29: pleural fluid results concerning for infection, advised to come to ED by Dr. Larinda Buttery.        Hospital course / significant events:  10/29: to ED as instructed, admitted to hospitalist service w/ pulmonary team to see in AM. Holding Eliquis for likely repeat thoracentesis, starting heparin.  10/30: hold Eliquis, plan chest tube placement tomorrow. Cytology (+)malignancy, c/w metastatic adenocarcinoma of lung origin. Further stains pending to confirm.  10/31: underwent repeat thoracentesis   11/01: pleural fluid no infection. Path results updated, concerning for possible mets from salivary primary - oncology to arrange follow up      Consultants:  Pulmonology  Oncology    Procedures/Surgeries: none           ASSESSMENT & PLAN:   SOB (shortness of breath) Acute hypoxemic respiratory failure  D/e pleural effusion, PE Supplemental oxygen prn Treat underlying cause(s) below    Pleural effusion on right, likely malignant, ruled out infectious  Plan chest tube placement tomorrow once off Eliquis 48+h Treat underlying  cause(s) below   Mass of upper lobe of right lung (+)Pleural fluid cytology malignancy, thought to be likely primary lung cancer but further path stains concerning for mets salivary gland cancer recurrence  Oncology consulted   Pulmonary embolism  Suspect from cancer and Covid.  transition to Eliquis on discharge   (+)COVID Supportive care    Positive culture finding on pleural fluid, bacillus sp -  likely contaminant, pneumonia/empyeme ruled OUT D/c abx    A-fib (HCC) Resume eliquis and beta blocker    Chronic diastolic CHF (congestive heart failure)  Stable.euvolemic.  PRN diuretic therapy Continue home beta blocker     Type 2 diabetes mellitus with hyperglycemia, without long-term current use of insulin (HCC) Rstart home meds                  Discharge Instructions  Allergies as of 12/22/2022       Reactions   Ciprofloxacin Nausea Only        Medication List     STOP taking these medications    amoxicillin-clavulanate 875-125 MG tablet Commonly known as: AUGMENTIN       TAKE these medications    Alpha-Lipoic Acid 600 MG Caps Take by mouth.   apixaban 5 MG Tabs tablet Commonly known as: ELIQUIS Take 1 tablet (5 mg total) by mouth 2 (two) times daily. What changed: See the new instructions.   balsalazide 750 MG capsule Commonly known as: COLAZAL Take 1,500 mg by mouth 2 (two) times daily.   DULoxetine 60 MG capsule Commonly known as: CYMBALTA Take 60 mg by mouth daily.   feeding supplement Liqd Take 237 mLs by mouth 3 (three) times daily between meals.   fluticasone 50 MCG/ACT nasal spray Commonly known as: FLONASE Place 1 spray into both nostrils daily.   glipiZIDE 5 MG 24 hr tablet Commonly known as: GLUCOTROL XL Take 1 tablet by mouth daily.   metoprolol tartrate 50 MG tablet Commonly known as: LOPRESSOR Take 50 mg by mouth 2 (two) times daily.   Niaspan 500 MG ER tablet Generic drug: niacin Take 500 mg by mouth daily.   pregabalin 150 MG capsule Commonly known as: LYRICA Take 150 mg by mouth 2 (two) times daily.   simvastatin 20 MG tablet Commonly known as: ZOCOR Take 20 mg by mouth daily.   Vitamin D-1000 Max St 25 MCG (1000 UT) tablet Generic drug: Cholecalciferol Take by mouth.               Discharge Care Instructions  (From admission, onward)           Start     Ordered   12/22/22 0000   Discharge wound care:       Comments: Keep site of thoracentesis clean/dry, change bandage daily or as needed if soiled, do not soak but ok to shower   12/22/22 0943             Follow-up Information     Assaker, [place], MD. Call.   Specialty: Pulmonary Disease Why: hospital follow up - their office should call but if you don't hear something in the nxt couple business days please call them to ensure follow up appointmetn Contact information: [address] [place] [address] [phone]                 Allergies  Allergen Reactions   Ciprofloxacin Nausea Only     Subjective: pt has no concerns this morning, denies CP/SOB  Discharge Exam: BP 105/66 (BP Location: Right Arm)   Pulse 95   Temp 97.8 F (36.6 C) (Oral)   Resp 19   Ht 6' (1.829 m)   Wt 110.2 kg   SpO2 90%   BMI 32.95 kg/m  General: Pt is alert, awake, not in acute distress Cardiovascular: RRR, S1/S2 +, no rubs, no gallops Respiratory: CTA bilaterally, no wheezing, no rhonchi Abdominal: Soft, NT, ND, bowel sounds + Extremities: no edema, no cyanosis     The results of significant diagnostics from this hospitalization (including imaging, microbiology, ancillary and laboratory) are listed below for reference.     Microbiology: Recent Results (from the past 240 hour(s))  Blood culture (single)     Status: None   Collection Time: 12/15/22  2:25 PM   Specimen: BLOOD  Result Value Ref Range Status   Specimen Description BLOOD BLOOD RIGHT FOREARM  Final   Special Requests   Final    BOTTLES DRAWN AEROBIC AND ANAEROBIC Blood Culture adequate volume   Culture   Final    NO GROWTH 5 DAYS Performed at [facility], [address] [place], [place] [address]    Report Status 12/20/2022 FINAL  Final  Culture, blood (single)     Status: None   Collection Time: 12/15/22  3:30 PM   Specimen: BLOOD  Result Value Ref Range Status   Specimen Description BLOOD  BLOOD RIGHT WRIST  Final   Special Requests   Final    BOTTLES DRAWN AEROBIC AND ANAEROBIC Blood Culture adequate volume   Culture   Final    NO GROWTH 5 DAYS Performed at [facility], [address] [place], [place] [address]    Report Status 12/20/2022 FINAL  Final  Pleural fluid culture w Gram Stain     Status: None   Collection Time: 12/16/22 12:49 PM   Specimen: Pleural Fluid  Result Value Ref Range Status   Specimen Description   Final    PLEURAL Performed at [facility], [address], [place], [place] [address]    Special Requests   Final    NONE Performed at [facility], [address] [place], [place] [address]    Gram Stain   Final    FEW WBC PRESENT, PREDOMINANTLY MONONUCLEAR NO ORGANISMS SEEN    Culture   Final    RARE BACILLUS SPECIES NOT ANTHRACIS Standardized susceptibility testing for this organism is not available. Performed at [facility], [address], [place], [place] [address]    Report Status 12/20/2022 FINAL  Final  C Difficile Quick Screen w PCR reflex     Status: None   Collection Time: 12/16/22  5:24 PM   Specimen: STOOL  Result Value Ref Range Status   C Diff antigen NEGATIVE NEGATIVE Final   C Diff toxin NEGATIVE NEGATIVE Final   C Diff interpretation No C. difficile detected.  Final    Comment: Performed at [facility], [address] [place], [place] [address]  Blood Culture (routine x 2)     Status: None (Preliminary result)   Collection Time: 12/19/22  6:16 PM   Specimen: BLOOD  Result Value Ref Range Status   Specimen Description BLOOD BLOOD LEFT ARM  Final   Special Requests   Final    BOTTLES DRAWN AEROBIC AND ANAEROBIC Blood Culture results may not be optimal due to an excessive volume of blood received in culture bottles   Culture   Final  NO GROWTH 3 DAYS Performed at [facility], [address] [place], [place] [address]    Report Status PENDING  Incomplete   Blood Culture (routine x 2)     Status: None (Preliminary result)   Collection Time: 12/19/22  6:21 PM   Specimen: BLOOD  Result Value Ref Range Status   Specimen Description BLOOD BLOOD RIGHT ARM  Final   Special Requests   Final    BOTTLES DRAWN AEROBIC AND ANAEROBIC Blood Culture results may not be optimal due to an inadequate volume of blood received in culture bottles   Culture   Final    NO GROWTH 3 DAYS Performed at [facility], [address], [place], [place] [address]    Report Status PENDING  Incomplete  Resp panel by RT-PCR (RSV, Flu A&B, Covid) Anterior Nasal Swab     Status: Abnormal   Collection Time: 12/19/22  6:40 PM   Specimen: Anterior Nasal Swab  Result Value Ref Range Status   SARS Coronavirus 2 by RT PCR POSITIVE (A) NEGATIVE Final    Comment: (NOTE) SARS-CoV-2 target nucleic acids are DETECTED.  The SARS-CoV-2 RNA is generally detectable in upper respiratory specimens during the acute phase of infection. Positive results are indicative of the presence of the identified virus, but do not rule out bacterial infection or co-infection with other pathogens not detected by the test. Clinical correlation with patient history and other diagnostic information is necessary to determine patient infection status. The expected result is Negative.  Fact Sheet for Patients: BloggerCourse.com  Fact Sheet for Healthcare Providers: SeriousBroker.it  This test is not yet approved or cleared by the [place] FDA and  has been authorized for detection and/or diagnosis of SARS-CoV-2 by FDA under an Emergency Use Authorization (EUA).  This EUA will remain in effect (meaning this test can be used) for the duration of  the COVID-19 declaration under Section 564(b)(1) of the A ct, 21 U.S.C. section 360bbb-3(b)(1), unless the authorization is terminated or revoked sooner.     Influenza A by PCR NEGATIVE NEGATIVE  Final   Influenza B by PCR NEGATIVE NEGATIVE Final    Comment: (NOTE) The Xpert Xpress SARS-CoV-2/FLU/RSV plus assay is intended as an aid in the diagnosis of influenza from Nasopharyngeal swab specimens and should not be used as a sole basis for treatment. Nasal washings and aspirates are unacceptable for Xpert Xpress SARS-CoV-2/FLU/RSV testing.  Fact Sheet for Patients: BloggerCourse.com  Fact Sheet for Healthcare Providers: SeriousBroker.it  This test is not yet approved or cleared by the [place] FDA and has been authorized for detection and/or diagnosis of SARS-CoV-2 by FDA under an Emergency Use Authorization (EUA). This EUA will remain in effect (meaning this test can be used) for the duration of the COVID-19 declaration under Section 564(b)(1) of the Act, 21 U.S.C. section 360bbb-3(b)(1), unless the authorization is terminated or revoked.     Resp Syncytial Virus by PCR NEGATIVE NEGATIVE Final    Comment: (NOTE) Fact Sheet for Patients: BloggerCourse.com  Fact Sheet for Healthcare Providers: SeriousBroker.it  This test is not yet approved or cleared by the [place] FDA and has been authorized for detection and/or diagnosis of SARS-CoV-2 by FDA under an Emergency Use Authorization (EUA). This EUA will remain in effect (meaning this test can be used) for the duration of the COVID-19 declaration under Section 564(b)(1) of the Act, 21 U.S.C. section 360bbb-3(b)(1), unless the authorization is terminated or revoked.  Performed at [facility], [address] [place], [place] [address]   Body fluid culture w Gram Stain     Status: None (Preliminary result)   Collection Time: 12/21/22  4:37 PM   Specimen: Pleura  Result Value Ref Range Status   Specimen Description   Final    PLEURAL Performed at [facility], [address]  [place], [place] [address]    Special Requests   Final    NONE Performed at [facility], [address] [place], [place] [address]    Gram Stain NO WBC SEEN NO ORGANISMS SEEN   Final   Culture   Final    NO GROWTH < 24 HOURS Performed at [facility], [address], [place], [place] [address]    Report Status PENDING  Incomplete     Labs: BNP (last 3 results) Recent Labs    12/15/22 1425  BNP 51.6   Basic Metabolic Panel: Recent Labs  Lab 12/17/22 0615 12/19/22 1840 12/20/22 0623 12/21/22 0532 12/22/22 0500  NA 130* 130* 132* 130* 130*  K 3.6 3.7 3.3* 3.8 3.8  CL 97* 93* 97* 97* 95*  CO2 24 24 27 26 25   GLUCOSE 109* 120* 91 133* 160*  BUN 10 12 11 16 18   CREATININE 0.72 0.86 0.76 0.86 0.77  CALCIUM 8.7* 9.1 8.5* 8.7* 8.8*  MG  --   --  1.6* 2.0  --   PHOS  --   --  3.4  --   --    Liver Function Tests: Recent Labs  Lab 12/19/22 1840 12/20/22 0623  AST 84* 79*  ALT 62* 57*  ALKPHOS 250* 207*  BILITOT 1.5* 0.9  PROT 7.0 5.9*  ALBUMIN 3.8 3.2*   No results for input(s): "LIPASE", "AMYLASE" in the last 168 hours. No results for input(s): "AMMONIA" in the last 168 hours. CBC: Recent Labs  Lab 12/17/22 0615 12/19/22 1840 12/20/22 0623 12/21/22 0532 12/22/22 0500  WBC 5.2 4.4 3.6* 3.5* 4.2  NEUTROABS  --  3.8  --   --   --   HGB 12.9* 13.5 11.8* 11.9* 12.0*  HCT 39.2 42.1 36.5* 35.4* 36.4*  MCV 86.9 87.3 87.1 85.1 86.3  PLT 154 173 155 158 169   Cardiac Enzymes: No results for input(s): "CKTOTAL", "CKMB", "CKMBINDEX", "TROPONINI" in the last 168 hours. BNP: Invalid input(s): "POCBNP" CBG: Recent Labs  Lab 12/16/22 0908 12/16/22 1152 12/16/22 1625 12/16/22 2008 12/17/22 0755  GLUCAP 132* 119* 151* 203* 107*   D-Dimer No results for input(s): "DDIMER" in the last 72 hours. Hgb A1c No results for input(s): "HGBA1C" in the last 72 hours. Lipid Profile No results for input(s): "CHOL", "HDL", "LDLCALC", "TRIG", "CHOLHDL",  "LDLDIRECT" in the last 72 hours. Thyroid function studies No results for input(s): "TSH", "T4TOTAL", "T3FREE", "THYROIDAB" in the last 72 hours.  Invalid input(s): "FREET3" Anemia work up No results for input(s): "VITAMINB12", "FOLATE", "FERRITIN", "TIBC", "IRON", "RETICCTPCT" in the last 72 hours. Urinalysis    Component Value Date/Time   APPEARANCEUR Clear 10/18/2015 1110   GLUCOSEU Negative 10/18/2015 1110   BILIRUBINUR Negative 10/18/2015 1110   PROTEINUR Negative 10/18/2015 1110   NITRITE Negative 10/18/2015 1110   LEUKOCYTESUR Negative 10/18/2015 1110   Sepsis Labs Recent Labs  Lab 12/19/22 1840 12/20/22 0623 12/21/22 0532 12/22/22 0500  WBC 4.4 3.6* 3.5* 4.2   Microbiology Recent Results (from the past 240 hour(s))  Blood culture (single)     Status: None   Collection Time: 12/15/22  2:25 PM  Specimen: BLOOD  Result Value Ref Range Status   Specimen Description BLOOD BLOOD RIGHT FOREARM  Final   Special Requests   Final    BOTTLES DRAWN AEROBIC AND ANAEROBIC Blood Culture adequate volume   Culture   Final    NO GROWTH 5 DAYS Performed at [facility], [address] [place], [place] [address]    Report Status 12/20/2022 FINAL  Final  Culture, blood (single)     Status: None   Collection Time: 12/15/22  3:30 PM   Specimen: BLOOD  Result Value Ref Range Status   Specimen Description BLOOD BLOOD RIGHT WRIST  Final   Special Requests   Final    BOTTLES DRAWN AEROBIC AND ANAEROBIC Blood Culture adequate volume   Culture   Final    NO GROWTH 5 DAYS Performed at [facility], [address] [place], [place] [address]    Report Status 12/20/2022 FINAL  Final  Pleural fluid culture w Gram Stain     Status: None   Collection Time: 12/16/22 12:49 PM   Specimen: Pleural Fluid  Result Value Ref Range Status   Specimen Description   Final    PLEURAL Performed at [facility], [address] [place], [place] [address]    Special  Requests   Final    NONE Performed at [facility], [address] [place], [place] [address]    Gram Stain   Final    FEW WBC PRESENT, PREDOMINANTLY MONONUCLEAR NO ORGANISMS SEEN    Culture   Final    RARE BACILLUS SPECIES NOT ANTHRACIS Standardized susceptibility testing for this organism is not available. Performed at [facility], [address] [place], [place] [address]    Report Status 12/20/2022 FINAL  Final  C Difficile Quick Screen w PCR reflex     Status: None   Collection Time: 12/16/22  5:24 PM   Specimen: STOOL  Result Value Ref Range Status   C Diff antigen NEGATIVE NEGATIVE Final   C Diff toxin NEGATIVE NEGATIVE Final   C Diff interpretation No C. difficile detected.  Final    Comment: Performed at [facility], [address] [place], [place] [address]  Blood Culture (routine x 2)     Status: None (Preliminary result)   Collection Time: 12/19/22  6:16 PM   Specimen: BLOOD  Result Value Ref Range Status   Specimen Description BLOOD BLOOD LEFT ARM  Final   Special Requests   Final    BOTTLES DRAWN AEROBIC AND ANAEROBIC Blood Culture results may not be optimal due to an excessive volume of blood received in culture bottles   Culture   Final    NO GROWTH 3 DAYS Performed at [facility], [address], [place], [place] [address]    Report Status PENDING  Incomplete  Blood Culture (routine x 2)     Status: None (Preliminary result)   Collection Time: 12/19/22  6:21 PM   Specimen: BLOOD  Result Value Ref Range Status   Specimen Description BLOOD BLOOD RIGHT ARM  Final   Special Requests   Final    BOTTLES DRAWN AEROBIC AND ANAEROBIC Blood Culture results may not be optimal due to an inadequate volume of blood received in culture bottles   Culture   Final    NO GROWTH 3 DAYS Performed at [facility], [address], [place], [place] [address]    Report Status PENDING  Incomplete  Resp panel  by RT-PCR (RSV, Flu  A&B, Covid) Anterior Nasal Swab     Status: Abnormal   Collection Time: 12/19/22  6:40 PM   Specimen: Anterior Nasal Swab  Result Value Ref Range Status   SARS Coronavirus 2 by RT PCR POSITIVE (A) NEGATIVE Final    Comment: (NOTE) SARS-CoV-2 target nucleic acids are DETECTED.  The SARS-CoV-2 RNA is generally detectable in upper respiratory specimens during the acute phase of infection. Positive results are indicative of the presence of the identified virus, but do not rule out bacterial infection or co-infection with other pathogens not detected by the test. Clinical correlation with patient history and other diagnostic information is necessary to determine patient infection status. The expected result is Negative.  Fact Sheet for Patients: BloggerCourse.com  Fact Sheet for Healthcare Providers: SeriousBroker.it  This test is not yet approved or cleared by the [place] FDA and  has been authorized for detection and/or diagnosis of SARS-CoV-2 by FDA under an Emergency Use Authorization (EUA).  This EUA will remain in effect (meaning this test can be used) for the duration of  the COVID-19 declaration under Section 564(b)(1) of the A ct, 21 U.S.C. section 360bbb-3(b)(1), unless the authorization is terminated or revoked sooner.     Influenza A by PCR NEGATIVE NEGATIVE Final   Influenza B by PCR NEGATIVE NEGATIVE Final    Comment: (NOTE) The Xpert Xpress SARS-CoV-2/FLU/RSV plus assay is intended as an aid in the diagnosis of influenza from Nasopharyngeal swab specimens and should not be used as a sole basis for treatment. Nasal washings and aspirates are unacceptable for Xpert Xpress SARS-CoV-2/FLU/RSV testing.  Fact Sheet for Patients: BloggerCourse.com  Fact Sheet for Healthcare Providers: SeriousBroker.it  This test is not yet approved or cleared by the [place]  FDA and has been authorized for detection and/or diagnosis of SARS-CoV-2 by FDA under an Emergency Use Authorization (EUA). This EUA will remain in effect (meaning this test can be used) for the duration of the COVID-19 declaration under Section 564(b)(1) of the Act, 21 U.S.C. section 360bbb-3(b)(1), unless the authorization is terminated or revoked.     Resp Syncytial Virus by PCR NEGATIVE NEGATIVE Final    Comment: (NOTE) Fact Sheet for Patients: BloggerCourse.com  Fact Sheet for Healthcare Providers: SeriousBroker.it  This test is not yet approved or cleared by the [place] FDA and has been authorized for detection and/or diagnosis of SARS-CoV-2 by FDA under an Emergency Use Authorization (EUA). This EUA will remain in effect (meaning this test can be used) for the duration of the COVID-19 declaration under Section 564(b)(1) of the Act, 21 U.S.C. section 360bbb-3(b)(1), unless the authorization is terminated or revoked.  Performed at [facility], [address] [place], [place] [address]   Body fluid culture w Gram Stain     Status: None (Preliminary result)   Collection Time: 12/21/22  4:37 PM   Specimen: Pleura  Result Value Ref Range Status   Specimen Description   Final    PLEURAL Performed at [facility], [address], [place], [place] [address]    Special Requests   Final    NONE Performed at [facility], [address] [place], [place] [address]    Gram Stain NO WBC SEEN NO ORGANISMS SEEN   Final   Culture   Final    NO GROWTH < 24 HOURS Performed at [facility], [address], [place], [place] [address]    Report Status PENDING  Incomplete  Imaging DG Chest Port 1 View  Result Date: 12/19/2022 CLINICAL DATA:  Shortness of breath, weakness EXAM: PORTABLE CHEST 1 VIEW COMPARISON:  12/16/2022 FINDINGS: Stable cardiomegaly. Slightly increased moderate right  pleural effusion and associated airspace opacities. The left lung is clear. No pneumothorax. IMPRESSION: Increased moderate right pleural effusion and associated atelectasis or pneumonia. Electronically Signed   By: Minerva Fester M.D.   On: 12/19/2022 21:58      Time coordinating discharge: over 30 minutes  SIGNED:  Sunnie Nielsen DO [facility]

## 2022-12-22 NOTE — Consult Note (Addendum)
PHARMACY CONSULT NOTE - ELECTROLYTES  Pharmacy Consult for Electrolyte Monitoring and Replacement   Recent Labs: Height: 6' (182.9 cm) Weight: 110.2 kg (242 lb 15.2 oz) IBW/kg (Calculated) : 77.6 Estimated Creatinine Clearance: 94.4 mL/min (by C-G formula based on SCr of 0.77 mg/dL). Potassium (mmol/L)  Date Value  12/22/2022 3.8   Magnesium (mg/dL)  Date Value  16/11/9602 2.0   Calcium (mg/dL)  Date Value  54/10/8117 8.8 (L)   Albumin (g/dL)  Date Value  14/78/2956 3.2 (L)   Phosphorus (mg/dL)  Date Value  21/30/8657 3.4   Sodium (mmol/L)  Date Value  12/22/2022 130 (L)   Assessment  Erik Hendricks is a 82 y.o. male presenting from outpatient office due to thoracentesis cytology. PMH significant for Afib, HTN, T2DM, HLD, and salivary ductal  . Pharmacy has been consulted to monitor and replace electrolytes.  Diet: NPO MIVF: N/A Pertinent medications: N/A  Goal of Therapy: Electrolytes WNL  Plan:  No replacement needed Pharmacy will sign off. Please re-consult if needed.   Thank you for allowing pharmacy to be a part of this patient's care.  Paschal Dopp, PharmD, BCPS 12/22/2022 10:18 AM

## 2022-12-22 NOTE — Care Management Important Message (Signed)
Important Message  Patient Details  Name: Erik Hendricks: [id number] Date of Birth: 12-Oct-1940   Important Message Given:  N/A - LOS <3 / Initial given by admissions     Olegario Messier A [name] 12/22/2022, 10:48 AM

## 2022-12-24 LAB — CULTURE, BLOOD (ROUTINE X 2)
Culture: NO GROWTH
Culture: NO GROWTH

## 2022-12-25 ENCOUNTER — Ambulatory Visit
Admission: RE | Admit: 2022-12-25 | Discharge: 2022-12-25 | Disposition: A | Discharge status: Home / Self Care | Payer: 59 | Source: Ambulatory Visit | Attending: Pulmonary Disease

## 2022-12-25 ENCOUNTER — Other Ambulatory Visit
Admission: RE | Admit: 2022-12-25 | Discharge: 2022-12-25 | Disposition: A | Discharge status: Home / Self Care | Payer: 59 | Source: Home / Self Care | Attending: Pulmonary Disease | Admitting: Pulmonary Disease

## 2022-12-25 ENCOUNTER — Encounter: Payer: Self-pay | Admitting: Pulmonary Disease

## 2022-12-25 ENCOUNTER — Ambulatory Visit
Admission: RE | Admit: 2022-12-25 | Discharge: 2022-12-25 | Disposition: A | Discharge status: Home / Self Care | Payer: 59 | Attending: Pulmonary Disease | Admitting: Pulmonary Disease

## 2022-12-25 ENCOUNTER — Telehealth: Payer: Self-pay | Admitting: Pulmonary Disease

## 2022-12-25 ENCOUNTER — Ambulatory Visit (INDEPENDENT_AMBULATORY_CARE_PROVIDER_SITE_OTHER): Payer: 59 | Admitting: Pulmonary Disease

## 2022-12-25 VITALS — BP 110/70 | HR 73 | Temp 97.3°F | Ht 72.0 in | Wt 242.0 lb

## 2022-12-25 DIAGNOSIS — J90 Pleural effusion, not elsewhere classified: Secondary | ICD-10-CM

## 2022-12-25 LAB — BODY FLUID CULTURE W GRAM STAIN
Culture: NO GROWTH
Gram Stain: NONE SEEN

## 2022-12-25 LAB — CBC WITH DIFFERENTIAL/PLATELET
Abs Immature Granulocytes: 0.05 10*3/uL (ref 0.00–0.07)
Basophils Absolute: 0 10*3/uL (ref 0.0–0.1)
Basophils Relative: 0 %
Eosinophils Absolute: 0 10*3/uL (ref 0.0–0.5)
Eosinophils Relative: 0 %
HCT: 42.2 % (ref 39.0–52.0)
Hemoglobin: 13.9 g/dL (ref 13.0–17.0)
Immature Granulocytes: 1 %
Lymphocytes Relative: 5 %
Lymphs Abs: 0.5 10*3/uL — ABNORMAL LOW (ref 0.7–4.0)
MCH: 28.8 pg (ref 26.0–34.0)
MCHC: 32.9 g/dL (ref 30.0–36.0)
MCV: 87.4 fL (ref 80.0–100.0)
Monocytes Absolute: 0.8 10*3/uL (ref 0.1–1.0)
Monocytes Relative: 9 %
Neutro Abs: 7.4 10*3/uL (ref 1.7–7.7)
Neutrophils Relative %: 85 %
Platelets: 226 10*3/uL (ref 150–400)
RBC: 4.83 MIL/uL (ref 4.22–5.81)
RDW: 15.6 % — ABNORMAL HIGH (ref 11.5–15.5)
WBC: 8.8 10*3/uL (ref 4.0–10.5)
nRBC: 0 % (ref 0.0–0.2)

## 2022-12-25 NOTE — Telephone Encounter (Signed)
I spoke with Erik Hendricks, I told her she is not on the patient's DPR. She said she will have her father add her to the DPR at his appt. She is asking if there is anyway you can see him today. He has not got any better since leaving the hospital on 10/31. Anytime he moves he gets out of breath. He will not eat and wants to sleep all day. They do not have a way to monitor his oxygen at home. They are asking if there is any way for him to be see today?

## 2022-12-25 NOTE — Telephone Encounter (Signed)
Noted. Nothing further needed. 

## 2022-12-25 NOTE — Telephone Encounter (Signed)
Pt's wife just called says he is up and walking around with no SOB. Will wait for tomorrow's appointment. Will call back if he has an episode

## 2022-12-25 NOTE — Progress Notes (Signed)
Synopsis: Referred in by [name], MD   Subjective:   PATIENT ID: Erik Hendricks GENDER: male DOB: Jun 23, 1940, MRN: [id number]  Chief Complaint  Patient presents with   Consult    DOE. No wheezing.Cough with tan sputum.    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   On presentation, he reported shortness of breath and weight loss. He was found to have a right sided pleural effusion with a subsegmental pulmonary embolism and a right sided lung mass. He underwent thoracentesis on 12/16/2022 with 1.7L of serous fluid removed and improvement in his symptoms. Pleural fluid analysis showed an exudate, and cytology results are back today and show malignancy (TTF-1 positive, enough cells for next gen sequencing) suggestive of NSCLCa (adenoca). Pleural fluid cultures are negative to date, cell counts show lymphocyte predominance, but gram stain was positive for rare bacillus (not anthracis). Thought to be a contaminant but referred again to the ED for a possible chest tube placement. He had another thoracentesis on 10/31 with 1.7L serosanguinous fluid removed.   He reports worsening shortness of breath over the last 24 to 48 hours. Denies any fever or chills. CXR with recurrent large right effusion. [place] in office shows anechoic large right pleural effusion. CBC with stable Hgb.    ROS He reports diminished appetite and fatigue all day long.  Objective:   Vitals:   12/25/22 1406  BP: 110/70  Pulse: 73  Temp: (!) 97.3 F (36.3 C)  SpO2: 91%  Weight: 242 lb (109.8 kg)  Height: 6' (1.829 m)   91% on RA BMI Readings from Last 3 Encounters:  12/25/22 32.82 kg/m  12/21/22 32.95 kg/m  12/17/22 32.44 kg/m   Wt Readings from Last 3 Encounters:  12/25/22 242 lb  (109.8 kg)  12/21/22 242 lb 15.2 oz (110.2 kg)  12/17/22 239 lb 3.2 oz (108.5 kg)    Physical Exam GEN: NAD, Healthy Appearing HEENT: Supple Neck, Reactive Pupils, EOMI  CVS: Normal S1, Normal S2, RRR, No murmurs or ES appreciated  Lungs: Diminished breath sounds over the right hemithorax.  Abdomen: Soft, non tender, non distended, + BS  Extremities: Warm and well perfused, No edema   Labs and imaging were reviewed.  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   #Recurrent malignant pleural effusion #Cyto suspicious for Stage IV salivary ductal carcinoma  #Acute LUL subsegmental PE now on  eliquis   Had a discussion with the family and the patient about our options pursuing an IPC for which anticoagulation needs to be held for 48 hours or trying to get him in for Image guided thoracentesis tomorrow and follow up with oncology by the end of the week.   They had a family member who passed away from PE after holding [facility] for a procedure and at this time opted to pursue IR guided thoracentesis. Order placed for tomorrow.   I explained to them that we are most likely looking at an IPC placement in the future.   #Subacute Hypoxic Respiratory failure   SATURATION QUALIFICATIONS:  (This note is used to comply with regulatory documentation for home oxygen) Patient Saturations on Room Air at Rest = 91% Patient Saturations on Room Air while Ambulating = 85% Patient Saturations on 2 Liters of oxygen while Ambulating = 94%   Return in about 4 weeks (around 01/22/2023).  I spent 60 minutes caring for this patient today, including preparing to see the patient, obtaining a medical history , reviewing a separately obtained history, performing a medically appropriate examination and/or evaluation, counseling and educating the patient/family/caregiver, ordering medications, tests, or procedures, referring and communicating with other health care professionals (not separately reported), documenting clinical information in the electronic health record, and independently interpreting results (not separately reported/billed) and communicating results to the patient/family/caregiver  [name], MD [facility] 12/25/2022 4:26 PM

## 2022-12-25 NOTE — Telephone Encounter (Signed)
Patient's daughter is calling. Patient has an appointment for tomorrow(12/26/22) but is having trouble breathing. She believes that he has more fluid building back on his lungs. He also hasn't been eating. She would like for him to have some oxygen or an inhaler until his appointment tomorrow. Please call and advise.

## 2022-12-26 ENCOUNTER — Ambulatory Visit
Admission: RE | Admit: 2022-12-26 | Discharge: 2022-12-26 | Discharge status: Home / Self Care | Payer: Medicare Other | Source: Ambulatory Visit | Attending: Pulmonary Disease

## 2022-12-26 ENCOUNTER — Inpatient Hospital Stay: Payer: 59

## 2022-12-26 ENCOUNTER — Inpatient Hospital Stay: Payer: 59 | Admitting: Internal Medicine

## 2022-12-26 ENCOUNTER — Ambulatory Visit
Admission: RE | Admit: 2022-12-26 | Discharge: 2022-12-26 | Disposition: A | Discharge status: Home / Self Care | Payer: 59 | Source: Ambulatory Visit

## 2022-12-26 ENCOUNTER — Ambulatory Visit: Payer: Medicare Other | Admitting: Internal Medicine

## 2022-12-26 ENCOUNTER — Other Ambulatory Visit: Payer: Medicare Other

## 2022-12-26 ENCOUNTER — Other Ambulatory Visit: Payer: Self-pay

## 2022-12-26 ENCOUNTER — Inpatient Hospital Stay: Payer: Medicare Other | Admitting: Pulmonary Disease

## 2022-12-26 DIAGNOSIS — Z9889 Other specified postprocedural states: Secondary | ICD-10-CM

## 2022-12-26 DIAGNOSIS — J90 Pleural effusion, not elsewhere classified: Secondary | ICD-10-CM | POA: Insufficient documentation

## 2022-12-26 MED ORDER — LIDOCAINE HCL (PF) 1 % IJ SOLN
10.0000 mL | Freq: Once | INTRAMUSCULAR | Status: AC
  Administered 2022-12-26: 10 mL via INTRADERMAL
  Filled 2022-12-26: qty 10

## 2022-12-26 NOTE — Progress Notes (Signed)
PROCEDURE SUMMARY:  Successful image-guided Right Therapeutic thoracentesis. Yielded 1.65 liters of clear yellow fluid. Patient tolerated procedure well. EBL: Zero No immediate complications.  Specimen were not sent for labs. Post procedure CXR shows no pneumothorax.  Please see imaging section of Epic for full dictation.  Ardith Dark PA-C 12/26/2022 11:25 AM  Patient ID: Erik Hendricks, male   DOB: [birth year], 82 y.o.   MRN: [id number]

## 2022-12-28 ENCOUNTER — Inpatient Hospital Stay: Payer: Medicare Other | Admitting: Pulmonary Disease

## 2022-12-29 ENCOUNTER — Encounter: Payer: Self-pay | Admitting: Internal Medicine

## 2022-12-29 ENCOUNTER — Encounter (HOSPITAL_COMMUNITY): Payer: Self-pay

## 2022-12-29 ENCOUNTER — Ambulatory Visit: Payer: Medicare Other | Admitting: Pulmonary Disease

## 2022-12-29 ENCOUNTER — Telehealth: Payer: Self-pay | Admitting: Pulmonary Disease

## 2022-12-29 ENCOUNTER — Other Ambulatory Visit: Payer: Self-pay

## 2022-12-29 ENCOUNTER — Inpatient Hospital Stay: Payer: 59

## 2022-12-29 ENCOUNTER — Inpatient Hospital Stay
Admission: AD | Admit: 2022-12-29 | Discharge: 2023-01-02 | DRG: 146 | Disposition: A | Discharge status: Hospice | Payer: 59 | Source: Ambulatory Visit | Attending: Obstetrics and Gynecology | Admitting: Obstetrics and Gynecology

## 2022-12-29 ENCOUNTER — Inpatient Hospital Stay: Payer: 59 | Attending: Internal Medicine | Admitting: Internal Medicine

## 2022-12-29 VITALS — BP 107/75 | HR 100 | Temp 96.9°F

## 2022-12-29 DIAGNOSIS — Z7901 Long term (current) use of anticoagulants: Secondary | ICD-10-CM

## 2022-12-29 DIAGNOSIS — I6389 Other cerebral infarction: Secondary | ICD-10-CM | POA: Diagnosis not present

## 2022-12-29 DIAGNOSIS — Z7189 Other specified counseling: Secondary | ICD-10-CM | POA: Diagnosis not present

## 2022-12-29 DIAGNOSIS — Z515 Encounter for palliative care: Secondary | ICD-10-CM

## 2022-12-29 DIAGNOSIS — Z66 Do not resuscitate: Secondary | ICD-10-CM | POA: Diagnosis present

## 2022-12-29 DIAGNOSIS — I3139 Other pericardial effusion (noninflammatory): Secondary | ICD-10-CM | POA: Diagnosis present

## 2022-12-29 DIAGNOSIS — I4891 Unspecified atrial fibrillation: Secondary | ICD-10-CM | POA: Insufficient documentation

## 2022-12-29 DIAGNOSIS — I700 Atherosclerosis of aorta: Secondary | ICD-10-CM | POA: Diagnosis not present

## 2022-12-29 DIAGNOSIS — E66811 Obesity, class 1: Secondary | ICD-10-CM | POA: Diagnosis present

## 2022-12-29 DIAGNOSIS — Z860100 Personal history of colon polyps, unspecified: Secondary | ICD-10-CM

## 2022-12-29 DIAGNOSIS — R634 Abnormal weight loss: Secondary | ICD-10-CM | POA: Diagnosis not present

## 2022-12-29 DIAGNOSIS — Z860101 Personal history of adenomatous and serrated colon polyps: Secondary | ICD-10-CM | POA: Diagnosis not present

## 2022-12-29 DIAGNOSIS — R627 Adult failure to thrive: Secondary | ICD-10-CM | POA: Diagnosis present

## 2022-12-29 DIAGNOSIS — E860 Dehydration: Secondary | ICD-10-CM | POA: Diagnosis present

## 2022-12-29 DIAGNOSIS — F419 Anxiety disorder, unspecified: Secondary | ICD-10-CM | POA: Diagnosis present

## 2022-12-29 DIAGNOSIS — C089 Malignant neoplasm of major salivary gland, unspecified: Principal | ICD-10-CM | POA: Diagnosis present

## 2022-12-29 DIAGNOSIS — E871 Hypo-osmolality and hyponatremia: Secondary | ICD-10-CM | POA: Diagnosis present

## 2022-12-29 DIAGNOSIS — Z789 Other specified health status: Secondary | ICD-10-CM

## 2022-12-29 DIAGNOSIS — Z87891 Personal history of nicotine dependence: Secondary | ICD-10-CM

## 2022-12-29 DIAGNOSIS — I10 Essential (primary) hypertension: Secondary | ICD-10-CM | POA: Insufficient documentation

## 2022-12-29 DIAGNOSIS — R918 Other nonspecific abnormal finding of lung field: Secondary | ICD-10-CM | POA: Insufficient documentation

## 2022-12-29 DIAGNOSIS — R7401 Elevation of levels of liver transaminase levels: Secondary | ICD-10-CM

## 2022-12-29 DIAGNOSIS — J90 Pleural effusion, not elsewhere classified: Secondary | ICD-10-CM | POA: Diagnosis not present

## 2022-12-29 DIAGNOSIS — I482 Chronic atrial fibrillation, unspecified: Secondary | ICD-10-CM | POA: Diagnosis present

## 2022-12-29 DIAGNOSIS — I2699 Other pulmonary embolism without acute cor pulmonale: Secondary | ICD-10-CM | POA: Diagnosis present

## 2022-12-29 DIAGNOSIS — R630 Anorexia: Secondary | ICD-10-CM | POA: Diagnosis not present

## 2022-12-29 DIAGNOSIS — K7290 Hepatic failure, unspecified without coma: Secondary | ICD-10-CM | POA: Diagnosis present

## 2022-12-29 DIAGNOSIS — Z7984 Long term (current) use of oral hypoglycemic drugs: Secondary | ICD-10-CM | POA: Diagnosis not present

## 2022-12-29 DIAGNOSIS — J1282 Pneumonia due to coronavirus disease 2019: Secondary | ICD-10-CM | POA: Diagnosis present

## 2022-12-29 DIAGNOSIS — R0902 Hypoxemia: Secondary | ICD-10-CM | POA: Diagnosis present

## 2022-12-29 DIAGNOSIS — Z79899 Other long term (current) drug therapy: Secondary | ICD-10-CM | POA: Insufficient documentation

## 2022-12-29 DIAGNOSIS — E785 Hyperlipidemia, unspecified: Secondary | ICD-10-CM | POA: Diagnosis present

## 2022-12-29 DIAGNOSIS — I110 Hypertensive heart disease with heart failure: Secondary | ICD-10-CM | POA: Diagnosis present

## 2022-12-29 DIAGNOSIS — E1165 Type 2 diabetes mellitus with hyperglycemia: Secondary | ICD-10-CM | POA: Diagnosis present

## 2022-12-29 DIAGNOSIS — J910 Malignant pleural effusion: Secondary | ICD-10-CM | POA: Insufficient documentation

## 2022-12-29 DIAGNOSIS — Z6833 Body mass index (BMI) 33.0-33.9, adult: Secondary | ICD-10-CM

## 2022-12-29 DIAGNOSIS — R54 Age-related physical debility: Secondary | ICD-10-CM | POA: Diagnosis present

## 2022-12-29 DIAGNOSIS — E8721 Acute metabolic acidosis: Secondary | ICD-10-CM | POA: Diagnosis present

## 2022-12-29 DIAGNOSIS — E875 Hyperkalemia: Secondary | ICD-10-CM | POA: Diagnosis present

## 2022-12-29 DIAGNOSIS — U071 COVID-19: Secondary | ICD-10-CM | POA: Diagnosis present

## 2022-12-29 DIAGNOSIS — Z8673 Personal history of transient ischemic attack (TIA), and cerebral infarction without residual deficits: Secondary | ICD-10-CM

## 2022-12-29 DIAGNOSIS — Z86711 Personal history of pulmonary embolism: Secondary | ICD-10-CM

## 2022-12-29 DIAGNOSIS — N179 Acute kidney failure, unspecified: Secondary | ICD-10-CM

## 2022-12-29 DIAGNOSIS — R011 Cardiac murmur, unspecified: Secondary | ICD-10-CM | POA: Diagnosis not present

## 2022-12-29 DIAGNOSIS — K649 Unspecified hemorrhoids: Secondary | ICD-10-CM | POA: Diagnosis present

## 2022-12-29 DIAGNOSIS — I639 Cerebral infarction, unspecified: Secondary | ICD-10-CM | POA: Diagnosis present

## 2022-12-29 DIAGNOSIS — I2510 Atherosclerotic heart disease of native coronary artery without angina pectoris: Secondary | ICD-10-CM | POA: Diagnosis present

## 2022-12-29 DIAGNOSIS — C07 Malignant neoplasm of parotid gland: Secondary | ICD-10-CM | POA: Diagnosis present

## 2022-12-29 DIAGNOSIS — E669 Obesity, unspecified: Secondary | ICD-10-CM | POA: Diagnosis present

## 2022-12-29 DIAGNOSIS — G8929 Other chronic pain: Secondary | ICD-10-CM | POA: Diagnosis present

## 2022-12-29 DIAGNOSIS — I5032 Chronic diastolic (congestive) heart failure: Secondary | ICD-10-CM | POA: Diagnosis present

## 2022-12-29 DIAGNOSIS — C7800 Secondary malignant neoplasm of unspecified lung: Secondary | ICD-10-CM | POA: Diagnosis present

## 2022-12-29 LAB — COMPREHENSIVE METABOLIC PANEL
ALT: 71 U/L — ABNORMAL HIGH (ref 0–44)
AST: 135 U/L — ABNORMAL HIGH (ref 15–41)
Albumin: 3.2 g/dL — ABNORMAL LOW (ref 3.5–5.0)
Alkaline Phosphatase: 320 U/L — ABNORMAL HIGH (ref 38–126)
Anion gap: 11 (ref 5–15)
BUN: 55 mg/dL — ABNORMAL HIGH (ref 8–23)
CO2: 25 mmol/L (ref 22–32)
Calcium: 8.9 mg/dL (ref 8.9–10.3)
Chloride: 90 mmol/L — ABNORMAL LOW (ref 98–111)
Creatinine, Ser: 1.53 mg/dL — ABNORMAL HIGH (ref 0.61–1.24)
GFR, Estimated: 46 mL/min — ABNORMAL LOW (ref >60)
Glucose, Bld: 132 mg/dL — ABNORMAL HIGH (ref 70–99)
Potassium: 4.6 mmol/L (ref 3.5–5.1)
Sodium: 126 mmol/L — ABNORMAL LOW (ref 135–145)
Total Bilirubin: 1.8 mg/dL — ABNORMAL HIGH (ref <1.2)
Total Protein: 6.4 g/dL — ABNORMAL LOW (ref 6.5–8.1)

## 2022-12-29 LAB — CBC WITH DIFFERENTIAL/PLATELET
Abs Immature Granulocytes: 0.03 10*3/uL (ref 0.00–0.07)
Basophils Absolute: 0 10*3/uL (ref 0.0–0.1)
Basophils Relative: 0 %
Eosinophils Absolute: 0 10*3/uL (ref 0.0–0.5)
Eosinophils Relative: 0 %
HCT: 40.6 % (ref 39.0–52.0)
Hemoglobin: 13.5 g/dL (ref 13.0–17.0)
Immature Granulocytes: 0 %
Lymphocytes Relative: 5 %
Lymphs Abs: 0.4 10*3/uL — ABNORMAL LOW (ref 0.7–4.0)
MCH: 28.4 pg (ref 26.0–34.0)
MCHC: 33.3 g/dL (ref 30.0–36.0)
MCV: 85.3 fL (ref 80.0–100.0)
Monocytes Absolute: 0.9 10*3/uL (ref 0.1–1.0)
Monocytes Relative: 11 %
Neutro Abs: 6.9 10*3/uL (ref 1.7–7.7)
Neutrophils Relative %: 84 %
Platelets: 272 10*3/uL (ref 150–400)
RBC: 4.76 MIL/uL (ref 4.22–5.81)
RDW: 15.5 % (ref 11.5–15.5)
WBC: 8.3 10*3/uL (ref 4.0–10.5)
nRBC: 0 % (ref 0.0–0.2)

## 2022-12-29 LAB — APTT: aPTT: 52 s — ABNORMAL HIGH (ref 24–36)

## 2022-12-29 LAB — HEPARIN LEVEL (UNFRACTIONATED): Heparin Unfractionated: >1.1 [IU]/mL — ABNORMAL HIGH (ref 0.30–0.70)

## 2022-12-29 LAB — GLUCOSE, CAPILLARY: Glucose-Capillary: 135 mg/dL — ABNORMAL HIGH (ref 70–99)

## 2022-12-29 LAB — PROTIME-INR
INR: 2.6 — ABNORMAL HIGH (ref 0.8–1.2)
Prothrombin Time: 27.7 s — ABNORMAL HIGH (ref 11.4–15.2)

## 2022-12-29 MED ORDER — HEPARIN BOLUS VIA INFUSION
5000.0000 [IU] | Freq: Once | INTRAVENOUS | Status: AC
  Administered 2022-12-29: 5000 [IU] via INTRAVENOUS
  Filled 2022-12-29: qty 5000

## 2022-12-29 MED ORDER — PREGABALIN 75 MG PO CAPS
150.0000 mg | ORAL_CAPSULE | Freq: Two times a day (BID) | ORAL | Status: DC
  Administered 2022-12-29 – 2022-12-31 (×5): 150 mg via ORAL
  Filled 2022-12-29 (×5): qty 2

## 2022-12-29 MED ORDER — METOPROLOL TARTRATE 25 MG PO TABS
12.5000 mg | ORAL_TABLET | Freq: Two times a day (BID) | ORAL | Status: DC
  Administered 2022-12-30 (×2): 12.5 mg via ORAL
  Filled 2022-12-29 (×2): qty 1

## 2022-12-29 MED ORDER — SODIUM CHLORIDE 0.9% FLUSH
3.0000 mL | Freq: Two times a day (BID) | INTRAVENOUS | Status: DC
  Administered 2022-12-30 – 2022-12-31 (×2): 3 mL via INTRAVENOUS

## 2022-12-29 MED ORDER — HYDROCODONE-ACETAMINOPHEN 5-325 MG PO TABS: 1.0000 | ORAL_TABLET | ORAL | Status: DC | PRN

## 2022-12-29 MED ORDER — ACETAMINOPHEN 325 MG PO TABS: 650.0000 mg | ORAL_TABLET | Freq: Four times a day (QID) | ORAL | Status: DC | PRN

## 2022-12-29 MED ORDER — DULOXETINE HCL 30 MG PO CPEP
60.0000 mg | ORAL_CAPSULE | Freq: Every day | ORAL | Status: DC
  Administered 2022-12-30 – 2022-12-31 (×2): 60 mg via ORAL
  Filled 2022-12-29 (×2): qty 2

## 2022-12-29 MED ORDER — INSULIN ASPART 100 UNIT/ML IJ SOLN: 0.0000 [IU] | Freq: Three times a day (TID) | INTRAMUSCULAR | Status: DC

## 2022-12-29 MED ORDER — ACETAMINOPHEN 650 MG RE SUPP: 650.0000 mg | Freq: Four times a day (QID) | RECTAL | Status: DC | PRN

## 2022-12-29 MED ORDER — HEPARIN (PORCINE) 25000 UT/250ML-% IV SOLN
1600.0000 [IU]/h | INTRAVENOUS | Status: DC
  Administered 2022-12-29: 1600 [IU]/h via INTRAVENOUS
  Filled 2022-12-29: qty 250

## 2022-12-29 MED ORDER — BALSALAZIDE DISODIUM 750 MG PO CAPS
1500.0000 mg | ORAL_CAPSULE | Freq: Two times a day (BID) | ORAL | Status: DC
  Administered 2022-12-30 – 2022-12-31 (×4): 1500 mg via ORAL
  Filled 2022-12-29 (×3): qty 2

## 2022-12-29 MED ORDER — METOPROLOL TARTRATE 50 MG PO TABS: 50.0000 mg | ORAL_TABLET | Freq: Two times a day (BID) | ORAL | Status: DC

## 2022-12-29 MED ORDER — HYDRALAZINE HCL 20 MG/ML IJ SOLN: 10.0000 mg | Freq: Four times a day (QID) | INTRAMUSCULAR | Status: DC | PRN

## 2022-12-29 MED ORDER — FLUTICASONE PROPIONATE 50 MCG/ACT NA SUSP
1.0000 | Freq: Every day | NASAL | Status: DC
  Filled 2022-12-29: qty 16

## 2022-12-29 MED ORDER — MORPHINE SULFATE (PF) 2 MG/ML IV SOLN: 2.0000 mg | INTRAVENOUS | Status: DC | PRN

## 2022-12-29 NOTE — Assessment & Plan Note (Signed)
Vitals:   12/29/22 2134  BP: 110/68  Started metoprolol at 12.5 mg bid as BP is low.

## 2022-12-29 NOTE — Assessment & Plan Note (Signed)
Stable no chest pain.  Cont heparin gtt. Cont statin. No chest pain.

## 2022-12-29 NOTE — Assessment & Plan Note (Signed)
Pt has recurrent pleural effusion and needs pleurx drain. Currently on eliquis and pt has not taken his PM dose and d/w pharmacy [place] about plan fro heparin gtt for Procedure.

## 2022-12-29 NOTE — Assessment & Plan Note (Signed)
Glycemic protocol.   

## 2022-12-29 NOTE — Telephone Encounter (Signed)
CXR ordered 

## 2022-12-29 NOTE — Progress Notes (Signed)
PHARMACY - ANTICOAGULATION CONSULT NOTE  Pharmacy Consult for Heparin Infusion Indication: pulmonary embolus  Allergies  Allergen Reactions   Ciprofloxacin Nausea Only    Patient Measurements:   Heparin Dosing Weight: 100.9 kg  Vital Signs: Temp: 96.9 F (36.1 C) (11/08 1457) Temp Source: Tympanic (11/08 1457) BP: 107/75 (11/08 1457) Pulse Rate: 100 (11/08 1457)  Labs: No results for input(s): "HGB", "HCT", "PLT", "APTT", "LABPROT", "INR", "HEPARINUNFRC", "HEPRLOWMOCWT", "CREATININE", "CKTOTAL", "CKMB", "TROPONINIHS" in the last 72 hours.  Estimated Creatinine Clearance: 94.3 mL/min (by C-G formula based on SCr of 0.77 mg/dL).   Medical History: Past Medical History:  Diagnosis Date   A-fib [facility])    Anal fissure    Colon polyp    Hemorrhoid    Hypertension    Murmur    Rectal abscess     Assessment: Patient is a 82 year old male with a past medical history of left parotid high-grade salivary duct carcinoma stage pT4a N2a status post left parotidectomy with overlying involved skin excision, left neck level 2-4 dissection and reconstruction with left cervical facial advancement flap on 07/01/2018 followed by adjuvant RT and 1 year of Lupron. He presented to [facility] with recurrent right pleural effusion and was noted to have a pulmonary embolism. Pharmacy was consulted to initiate patient on a heparin infusion. Of note, patient does take apixaban at home, with his last dose being 11/8 AM.  Baseline heparin level, INR, and aPTT ordered.  No signs/symptoms of bleeding noted in chart. Baseline CBC ordered.  Goal of Therapy:  Heparin level 0.3-0.7 units/ml aPTT 66-102 seconds Monitor platelets by anticoagulation protocol: Yes   Plan:  Give 5000 unit bolus x 1 (lower bolus given recent use of apixaban) Start heparin infusion at 1600 units/hr Check aPTT in 8 hours Continue to monitor heparin level and CBC daily   Merryl Hacker, PharmD Clinical Pharmacist 12/29/2022,9:14  PM

## 2022-12-29 NOTE — Progress Notes (Signed)
Patient is severely weak, he isn't drinking enough fluids. He is aware and responsive. Appetite is gone as well. Family feels like he needs another thoracentesis.

## 2022-12-29 NOTE — H&P (Signed)
History and Physical    Patient: Erik Hendricks LKG:[id number] DOB: 1940/08/21 DOA: 12/29/2022 DOS: the patient was seen and examined on 12/29/2022 PCP: [name], MD  Patient coming from: Home  Chief Complaint: Pleural effusion  HPI: Erik Hendricks is a 82 y.o. male with medical history significant for pulmonary embolism on Eliquis, recurrent metastatic high-grade salivary duct cancer, status post parotidectomy, left neck level dissection and reconstruction with left cervical facial advancement flap referred by oncology for recurrent malignant right-sided pleural effusion.  Per report patient has had significant declining functional status the past few weeks, patient initially declined Pleurx catheter.  Patient was a direct admit for initiation of heparin drip coming off of Eliquis and to get a Pleurx catheter on Monday per wife and daughter at bedside.  No current labs as patient is a direct admit patient does have recent blood work showing glucose of 160 normal creatinine, patient does have abnormal LFTs with alk phos of 207 albumin 3.2 AST 79 ALT 57 total protein 5.9 total bili 0.9 EGFR > 60.  In the ED pt received: N/A. Medications  balsalazide (COLAZAL) capsule 1,500 mg (has no administration in time range)  DULoxetine (CYMBALTA) DR capsule 60 mg (has no administration in time range)  fluticasone (FLONASE) 50 MCG/ACT nasal spray 1 spray (has no administration in time range)  pregabalin (LYRICA) capsule 150 mg (has no administration in time range)  insulin aspart (novoLOG) injection 0-9 Units (has no administration in time range)  sodium chloride flush (NS) 0.9 % injection 3 mL (has no administration in time range)  acetaminophen (TYLENOL) tablet 650 mg (has no administration in time range)    Or  acetaminophen (TYLENOL) suppository 650 mg (has no administration in time range)  HYDROcodone-acetaminophen (NORCO/VICODIN) 5-325 MG per tablet 1 tablet (has no administration in time  range)  morphine (PF) 2 MG/ML injection 2 mg (has no administration in time range)  hydrALAZINE (APRESOLINE) injection 10 mg (has no administration in time range)  heparin bolus via infusion 5,000 Units (has no administration in time range)  heparin ADULT infusion 100 units/mL (25000 units/238mL) (has no administration in time range)  metoprolol tartrate (LOPRESSOR) tablet 12.5 mg (has no administration in time range)   Review of Systems  Constitutional:  Positive for malaise/fatigue. Negative for fever.  Respiratory:  Positive for shortness of breath. Negative for cough.   Cardiovascular:  Negative for chest pain and leg swelling.  Neurological:  Positive for weakness.   Past Medical History:  Diagnosis Date   A-fib [facility])    Anal fissure    Colon polyp    Hemorrhoid    Hypertension    Murmur    Rectal abscess    Past Surgical History:  Procedure Laterality Date   ANAL FISTULECTOMY  September 2013   Posterior subcutaneous fistula treated by fistulotomy.   COLONOSCOPY  2007, 2012   Dr Lemar Livings   COLONOSCOPY WITH PROPOFOL N/A 06/21/2018   Procedure: COLONOSCOPY WITH PROPOFOL;  Surgeon: Christena Deem, MD;  Location: [facility] ENDOSCOPY;  Service: Endoscopy;  Laterality: N/A;   FISSURECTOMY     40 yrs ago   KNEE SURGERY Left 2005   POLYPECTOMY  2007   RECTAL SURGERY  40 yrs ago   cyst    reports that he has quit smoking. His smoking use included cigars. He has never used smokeless tobacco. He reports that he does not drink alcohol and does not use drugs.  Allergies  Allergen Reactions   Ciprofloxacin Nausea  Only    Family History  Problem Relation Age of Onset   Heart Problems Father    Prostate cancer Neg Hx    Bladder Cancer Neg Hx     Prior to Admission medications   Medication Sig Start Date End Date Taking? Authorizing Provider  Alpha-Lipoic Acid 600 MG CAPS Take by mouth.    [provider]  apixaban (ELIQUIS) 5 MG TABS tablet Take 1 tablet (5 mg total)  by mouth 2 (two) times daily. 12/22/22   Sunnie Nielsen, DO  balsalazide (COLAZAL) 750 MG capsule Take 1,500 mg by mouth 2 (two) times daily.    [provider]  Cholecalciferol (VITAMIN D-1000 MAX ST) 25 MCG (1000 UT) tablet Take by mouth.    [provider]  DULoxetine (CYMBALTA) 60 MG capsule Take 60 mg by mouth daily.    [provider]  feeding supplement (ENSURE ENLIVE / ENSURE PLUS) LIQD Take 237 mLs by mouth 3 (three) times daily between meals. 12/17/22   Alford Highland, MD  fluticasone (FLONASE) 50 MCG/ACT nasal spray Place 1 spray into both nostrils daily.    [provider]  glipiZIDE (GLUCOTROL XL) 5 MG 24 hr tablet Take 1 tablet by mouth daily. 06/22/22   [provider]  metoprolol tartrate (LOPRESSOR) 50 MG tablet Take 50 mg by mouth 2 (two) times daily. 05/02/18   [provider]  niacin (NIASPAN) 500 MG CR tablet Take 500 mg by mouth daily.     [provider]  pregabalin (LYRICA) 150 MG capsule Take 150 mg by mouth 2 (two) times daily. 04/18/18   [provider]  simvastatin (ZOCOR) 20 MG tablet Take 20 mg by mouth daily.  02/01/18   [provider]   Vitals:   12/29/22 2100 12/29/22 2134  BP:  110/68  Pulse:  61  Resp:  16  Temp:  97.7 F (36.5 C)  SpO2:  94%  Weight: 109.8 kg   Height: 6' 0.01" (1.829 m) 6' (1.829 m)   Physical Exam Vitals and nursing note reviewed.  Constitutional:      General: He is not in acute distress. HENT:     Head: Normocephalic and atraumatic.     Right Ear: Hearing normal.     Left Ear: Hearing normal.     Nose: Nose normal. No nasal deformity.     Mouth/Throat:     Lips: Pink.     Tongue: No lesions.     Pharynx: Oropharynx is clear.  Eyes:     General: Lids are normal.     Extraocular Movements: Extraocular movements intact.  Cardiovascular:     Rate and Rhythm: Normal rate and regular rhythm.     Heart sounds: Normal heart sounds.  Pulmonary:      Effort: Pulmonary effort is normal.     Breath sounds: Normal breath sounds.  Abdominal:     General: Bowel sounds are normal. There is no distension.     Palpations: Abdomen is soft. There is no mass.     Tenderness: There is no abdominal tenderness.  Musculoskeletal:     Right lower leg: No edema.     Left lower leg: No edema.  Skin:    General: Skin is warm.  Neurological:     General: No focal deficit present.     Mental Status: He is alert and oriented to person, place, and time.     Cranial Nerves: Cranial nerves 2-12 are intact.  Psychiatric:  Attention and Perception: Attention normal.        Mood and Affect: Mood normal.        Speech: Speech normal.        Behavior: Behavior normal. Behavior is cooperative.     Labs on Admission: I have personally reviewed following labs and imaging studies Results for orders placed or performed during the hospital encounter of 12/29/22 (from the past 24 hour(s))  Glucose, capillary     Status: Abnormal   Collection Time: 12/29/22  9:17 PM  Result Value Ref Range   Glucose-Capillary 135 (H) 70 - 99 mg/dL  APTT     Status: Abnormal   Collection Time: 12/29/22  9:27 PM  Result Value Ref Range   aPTT 52 (H) 24 - 36 seconds  Comprehensive metabolic panel     Status: Abnormal   Collection Time: 12/29/22  9:27 PM  Result Value Ref Range   Sodium 126 (L) 135 - 145 mmol/L   Potassium 4.6 3.5 - 5.1 mmol/L   Chloride 90 (L) 98 - 111 mmol/L   CO2 25 22 - 32 mmol/L   Glucose, Bld 132 (H) 70 - 99 mg/dL   BUN 55 (H) 8 - 23 mg/dL   Creatinine, Ser 5.62 (H) 0.61 - 1.24 mg/dL   Calcium 8.9 8.9 - 13.0 mg/dL   Total Protein 6.4 (L) 6.5 - 8.1 g/dL   Albumin 3.2 (L) 3.5 - 5.0 g/dL   AST 865 (H) 15 - 41 U/L   ALT 71 (H) 0 - 44 U/L   Alkaline Phosphatase 320 (H) 38 - 126 U/L   Total Bilirubin 1.8 (H) <1.2 mg/dL   GFR, Estimated 46 (L) >60 mL/min   Anion gap 11 5 - 15  CBC with Differential/Platelet     Status: Abnormal   Collection  Time: 12/29/22  9:27 PM  Result Value Ref Range   WBC 8.3 4.0 - 10.5 K/uL   RBC 4.76 4.22 - 5.81 MIL/uL   Hemoglobin 13.5 13.0 - 17.0 g/dL   HCT 78.4 69.6 - 29.5 %   MCV 85.3 80.0 - 100.0 fL   MCH 28.4 26.0 - 34.0 pg   MCHC 33.3 30.0 - 36.0 g/dL   RDW 28.4 13.2 - 44.0 %   Platelets 272 150 - 400 K/uL   nRBC 0.0 0.0 - 0.2 %   Neutrophils Relative % 84 %   Neutro Abs 6.9 1.7 - 7.7 K/uL   Lymphocytes Relative 5 %   Lymphs Abs 0.4 (L) 0.7 - 4.0 K/uL   Monocytes Relative 11 %   Monocytes Absolute 0.9 0.1 - 1.0 K/uL   Eosinophils Relative 0 %   Eosinophils Absolute 0.0 0.0 - 0.5 K/uL   Basophils Relative 0 %   Basophils Absolute 0.0 0.0 - 0.1 K/uL   Immature Granulocytes 0 %   Abs Immature Granulocytes 0.03 0.00 - 0.07 K/uL  Protime-INR     Status: Abnormal   Collection Time: 12/29/22  9:33 PM  Result Value Ref Range   Prothrombin Time 27.7 (H) 11.4 - 15.2 seconds   INR 2.6 (H) 0.8 - 1.2  Heparin level (unfractionated)     Status: Abnormal   Collection Time: 12/29/22  9:33 PM  Result Value Ref Range   Heparin Unfractionated >1.10 (H) 0.30 - 0.70 IU/mL   CBC: Recent Labs  Lab 12/25/22 1522 12/29/22 2127  WBC 8.8 8.3  NEUTROABS 7.4 6.9  HGB 13.9 13.5  HCT 42.2 40.6  MCV 87.4 85.3  PLT 226 272   Basic Metabolic Panel: Recent Labs  Lab 12/29/22 2127  NA 126*  K 4.6  CL 90*  CO2 25  GLUCOSE 132*  BUN 55*  CREATININE 1.53*  CALCIUM 8.9   GFR: Estimated Creatinine Clearance: 49.3 mL/min (A) (by C-G formula based on SCr of 1.53 mg/dL (H)). Liver Function Tests: Recent Labs  Lab 12/29/22 2127  AST 135*  ALT 71*  ALKPHOS 320*  BILITOT 1.8*  PROT 6.4*  ALBUMIN 3.2*   No results for input(s): "LIPASE", "AMYLASE" in the last 168 hours. No results for input(s): "AMMONIA" in the last 168 hours. Coagulation Profile: Recent Labs  Lab 12/29/22 2133  INR 2.6*   Cardiac Enzymes: No results for input(s): "CKTOTAL", "CKMB", "CKMBINDEX", "TROPONINI" in the last 168  hours. BNP (last 3 results) No results for input(s): "PROBNP" in the last 8760 hours. HbA1C: No results for input(s): "HGBA1C" in the last 72 hours. CBG: Recent Labs  Lab 12/29/22 2117  GLUCAP 135*   Lipid Profile: No results for input(s): "CHOL", "HDL", "LDLCALC", "TRIG", "CHOLHDL", "LDLDIRECT" in the last 72 hours. Thyroid Function Tests: No results for input(s): "TSH", "T4TOTAL", "FREET4", "T3FREE", "THYROIDAB" in the last 72 hours. Anemia Panel: No results for input(s): "VITAMINB12", "FOLATE", "FERRITIN", "TIBC", "IRON", "RETICCTPCT" in the last 72 hours. Urinalysis    Component Value Date/Time   APPEARANCEUR Clear 10/18/2015 1110   GLUCOSEU Negative 10/18/2015 1110   BILIRUBINUR Negative 10/18/2015 1110   PROTEINUR Negative 10/18/2015 1110   NITRITE Negative 10/18/2015 1110   LEUKOCYTESUR Negative 10/18/2015 1110   Unresulted Labs (From admission, onward)     Start     Ordered   12/30/22 0600  APTT  Once-Timed,   TIMED        12/29/22 2127   12/30/22 0500  Comprehensive metabolic panel  Tomorrow morning,   R        12/29/22 2112   12/30/22 0500  CBC  Tomorrow morning,   R        12/29/22 2112           Medications  balsalazide (COLAZAL) capsule 1,500 mg (has no administration in time range)  DULoxetine (CYMBALTA) DR capsule 60 mg (has no administration in time range)  fluticasone (FLONASE) 50 MCG/ACT nasal spray 1 spray (has no administration in time range)  pregabalin (LYRICA) capsule 150 mg (has no administration in time range)  insulin aspart (novoLOG) injection 0-9 Units (has no administration in time range)  sodium chloride flush (NS) 0.9 % injection 3 mL (has no administration in time range)  acetaminophen (TYLENOL) tablet 650 mg (has no administration in time range)    Or  acetaminophen (TYLENOL) suppository 650 mg (has no administration in time range)  HYDROcodone-acetaminophen (NORCO/VICODIN) 5-325 MG per tablet 1 tablet (has no administration in time  range)  morphine (PF) 2 MG/ML injection 2 mg (has no administration in time range)  hydrALAZINE (APRESOLINE) injection 10 mg (has no administration in time range)  heparin bolus via infusion 5,000 Units (has no administration in time range)  heparin ADULT infusion 100 units/mL (25000 units/282mL) (has no administration in time range)  metoprolol tartrate (LOPRESSOR) tablet 12.5 mg (has no administration in time range)    Data Reviewed: Relevant notes from primary care and specialist visits, past discharge summaries as available in EHR, including Care Everywhere. Prior diagnostic testing as pertinent to current admission diagnoses Updated medications and problem lists for reconciliation ED course, including vitals, labs, imaging, treatment and response to treatment  Triage notes, nursing and pharmacy notes and ED provider's notes Notable results as noted in HPI  Assessment and Plan: * Pleural effusion on right Pt has recurrent pleural effusion and needs pleurx drain. Currently on eliquis and pt has not taken his PM dose and d/w pharmacy [facility] about plan fro heparin gtt for Procedure.   Pulmonary embolism (HCC) Heparin gtt per pharmacy protocol.  Aptt ordered.   A-fib (HCC) Sinus rhythm on auscultation.  Cont with heparin drip.  CHA2DS2/VAS Stroke Risk Points  Current as of 15 minutes ago     8 >= 2 Points: High Risk  1 to 1.99 Points: Medium Risk  0 Points: Low Risk    No Change      Details    This score determines the patient's risk of having a stroke if the  patient has atrial fibrillation.       Points Metrics  1 Has Congestive Heart Failure:  Yes    Current as of 15 minutes ago  1 Has Vascular Disease:  Yes    Current as of 15 minutes ago  1 Has Hypertension:  Yes    Current as of 15 minutes ago  2 Age:  37    Current as of 15 minutes ago  1 Has Diabetes:  Yes    Current as of 15 minutes ago  2 Had Stroke:  Yes  Had TIA:  Yes  Had Thromboembolism:  No     Current as of 15 minutes ago  0 Male:  No    Current as of 15 minutes ago             COVID-19 virus infection Contact isolation.    Type 2 diabetes mellitus with hyperglycemia, without long-term current use of insulin (HCC) Glycemic protocol.    HTN (hypertension) Vitals:   12/29/22 2134  BP: 110/68  Started metoprolol at 12.5 mg bid as BP is low.     Coronary artery disease Stable no chest pain.  Cont heparin gtt. Cont statin. No chest pain.      Prognosis: Fair.   DVT prophylaxis:  Heparin gtt.   Consults:  None.  Advance Care Planning:    Code Status: Full Code   Family Communication:  Wife at bedside.   Disposition Plan:  Home   Severity of Illness: The appropriate patient status for this patient is OBSERVATION. Observation status is judged to be reasonable and necessary in order to provide the required intensity of service to ensure the patient's safety. The patient's presenting symptoms, physical exam findings, and initial radiographic and laboratory data in the context of their medical condition is felt to place them at decreased risk for further clinical deterioration. Furthermore, it is anticipated that the patient will be medically stable for discharge from the hospital within 2 midnights of admission.   Author: Gertha Calkin, MD 12/29/2022 10:38 PM  For on call review [email or web address].

## 2022-12-29 NOTE — Telephone Encounter (Signed)
Pt is still not doing great. He has an appointment at 2:30 at the cancer center. The family doesn't feel like he can do both. They are asking if you are able to do a phone visit or touch base with cancer doctor. Please call [phone]

## 2022-12-29 NOTE — Assessment & Plan Note (Signed)
Contact isolation

## 2022-12-29 NOTE — Progress Notes (Addendum)
[facility] CONSULT NOTE  Patient Care Team: [name], MD as PCP - General (Family Medicine) Lemar Livings, Merrily Pew, MD (General Surgery) Michaelyn Barter, MD as Consulting Physician (Oncology)  REFERRING PROVIDER: Dr. Larinda Buttery  REASON FOR REFFERAL: metastatic malignant salivary duct cancer  CANCER STAGING   Cancer Staging  Salivary gland carcinoma [facility]) Staging form: Major Salivary Glands, AJCC 8th Edition - Clinical: Stage IVC (cM1) - Signed by Michaelyn Barter, MD on 12/29/2022   ASSESSMENT & PLAN:  Erik Hendricks 82 y.o. 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  Presented to [facility] with recurrent right pleural effusion.  Cytology consistent with metastatic high-grade salivary ductal cancer.  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  Completed at [facility].  -s/p thoracentesis x3 in the past 2 weeks.  Cytology consistent with metastatic high-grade salivary duct cancer.  -CTA chest From 12/15/2022 showed small subsegmental PE in the left upper lobe, 4.4 x 3.6 cm nodular and masslike lesions in the right lung concerning for malignancy, large right pleural effusion.  PET recommended.  -Patient was seen today in the clinic accompanied by 2 daughters, son-in-law and wife. I met the patient and family for the first time. Patient had significant decline in his functional status over the past 4  weeks requiring 2 assist.  The family had to call EMS to bring him to the appointment with me at the cancer center because he is very weak to get in and out of his car.  Having recurrent pleural effusions.  He declined Pleurx catheter previously due to concern about coming off the anticoagulation and progression of the blood clot.  His sister had a complication from stopping blood thinner and passing away from blood clot.  His appetite has been poor.  Has weight loss.  On 2 L oxygen.  Is barely able to walk few steps and gets out of breath.  I discussed in detail about the imaging finding and the cytology report consistent with recurrence of his salivary duct cancer.  The cancer is rare and tends to behave aggressively.  Currently he is very weak and has poor functional status.  Will hold off on PET CT scan until I see some recovery with his strength.  In the meantime, I am just going to have HER2 IHC and NGS testing sent.  - With his current state, I am very concerned for him to go back to home.  We reached out to the inpatient team and I spoke with the admitting hospitalist who has kindly agreed for direct admit for the patient.  Currently they do not have any beds available.  They will call the patient once the bed becomes available. Pt declines to go to ER right now. He understands while waiting for bed his symptoms worsen he will need to report to ER.  Hopefully as inpatient we can hold the Eliquis switch him to heparin and get the Pleurx catheter for symptom control.  Cytology is showing AR positivity. Will consider  starting Lupron and Bicalutamide.   Orders Placed This Encounter  Procedures   CMP (Cancer Center only)    Standing Status:   Future    Standing Expiration Date:   12/29/2023   CBC with Differential (Cancer Center Only)    Standing Status:   Future    Standing Expiration Date:   12/29/2023   RTC in 2 weeks for MD visit.  The total time spent in the appointment was 80 minutes encounter  with patients including review of chart and various tests results, discussions about plan of care and coordination of care plan   All questions were answered. The patient knows to call the clinic with any problems, questions or concerns. No barriers to learning was detected.  Michaelyn Barter, MD 11/8/20244:07 PM   HISTORY OF PRESENTING ILLNESS:  Erik Hendricks 82 y.o. 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  Presented to [facility] with recurrent right pleural effusion.  Cytology consistent with metastatic high-grade salivary   I have reviewed his chart and materials related to his cancer extensively and collaborated history with the patient. Summary of oncologic history is as follows: Oncology History  Salivary gland carcinoma (HCC)  06/06/2018 Initial Diagnosis   Salivary gland carcinoma (HCC)   12/29/2022 Cancer Staging   Staging form: Major Salivary Glands, AJCC 8th Edition - Clinical: Stage IVC (cM1) - Signed by Michaelyn Barter, MD on 12/29/2022     MEDICAL HISTORY:  Past Medical History:  Diagnosis Date   A-fib [facility])    Anal fissure    Colon polyp    Hemorrhoid    Hypertension    Murmur    Rectal abscess     SURGICAL HISTORY: Past Surgical History:  Procedure Laterality Date   ANAL FISTULECTOMY  September 2013   Posterior subcutaneous fistula treated by fistulotomy.   COLONOSCOPY  2007, 2012   Dr Lemar Livings   COLONOSCOPY WITH PROPOFOL N/A 06/21/2018   Procedure: COLONOSCOPY WITH PROPOFOL;  Surgeon: Christena Deem, MD;  Location: [facility] ENDOSCOPY;  Service: Endoscopy;  Laterality: N/A;   FISSURECTOMY     40 yrs ago   KNEE SURGERY Left 2005   POLYPECTOMY  2007   RECTAL SURGERY  40 yrs ago   cyst    SOCIAL  HISTORY: Social History   Socioeconomic History   Marital status: Married    Spouse name: Not on file   Number of children: Not on file   Years of education: Not on file   Highest education level: Not on file  Occupational History   Not on file  Tobacco Use   Smoking status: Former    Types: Cigars   Smokeless tobacco: Never   Tobacco comments:    Quit over 50 years ago. Smoked 2-3 a week.  Substance and Sexual Activity   Alcohol use: No   Drug use: No   Sexual activity: Not on file  Other Topics Concern   Not on file  Social History Narrative   Not on file   Social Determinants of Health   Financial Resource Strain: Patient Declined (08/08/2022)   Received from [facility], [facility]   Overall Financial Resource Strain (CARDIA)    Difficulty of Paying Living Expenses: Patient declined  Food Insecurity: No  Food Insecurity (12/29/2022)   Hunger Vital Sign    Worried About Running Out of Food in the Last Year: Never true    Ran Out of Food in the Last Year: Never true  Transportation Needs: No Transportation Needs (12/29/2022)   PRAPARE - Administrator, Civil Service (Medical): No    Lack of Transportation (Non-Medical): No  Physical Activity: Not on file  Stress: Not on file  Social Connections: Not on file  Intimate Partner Violence: Not At Risk (12/29/2022)   Humiliation, Afraid, Rape, and Kick questionnaire    Fear of Current or Ex-Partner: No    Emotionally Abused: No    Physically Abused: No    Sexually Abused: No    FAMILY HISTORY: Family History  Problem Relation Age of Onset   Heart Problems Father    Prostate cancer Neg Hx    Bladder Cancer Neg Hx     ALLERGIES:  is allergic to ciprofloxacin.  MEDICATIONS:  Current Outpatient Medications  Medication Sig Dispense Refill   Alpha-Lipoic Acid 600 MG CAPS Take by mouth.     apixaban (ELIQUIS) 5 MG TABS tablet Take 1 tablet (5 mg total) by mouth 2 (two)  times daily.     balsalazide (COLAZAL) 750 MG capsule Take 1,500 mg by mouth 2 (two) times daily.     Cholecalciferol (VITAMIN D-1000 MAX ST) 25 MCG (1000 UT) tablet Take by mouth.     DULoxetine (CYMBALTA) 60 MG capsule Take 60 mg by mouth daily.     feeding supplement (ENSURE ENLIVE / ENSURE PLUS) LIQD Take 237 mLs by mouth 3 (three) times daily between meals. 21330 mL 0   fluticasone (FLONASE) 50 MCG/ACT nasal spray Place 1 spray into both nostrils daily.     glipiZIDE (GLUCOTROL XL) 5 MG 24 hr tablet Take 1 tablet by mouth daily.     metoprolol tartrate (LOPRESSOR) 50 MG tablet Take 50 mg by mouth 2 (two) times daily.     niacin (NIASPAN) 500 MG CR tablet Take 500 mg by mouth daily.      pregabalin (LYRICA) 150 MG capsule Take 150 mg by mouth 2 (two) times daily.     simvastatin (ZOCOR) 20 MG tablet Take 20 mg by mouth daily.      No current facility-administered medications for this visit.    REVIEW OF SYSTEMS:   Pertinent information mentioned in HPI All other systems were reviewed with the patient and are negative.  PHYSICAL EXAMINATION: ECOG PERFORMANCE STATUS: 3 - Symptomatic, >50% confined to bed  Vitals:   12/29/22 1457  BP: 107/75  Pulse: 100  Temp: (!) 96.9 F (36.1 C)  SpO2: 94%   There were no vitals filed for this visit.  GENERAL:alert, no distress and comfortable SKIN: skin color, texture, turgor are normal, no rashes or significant lesions EYES: normal, conjunctiva are pink and non-injected, sclera clear OROPHARYNX:no exudate, no erythema and lips, buccal mucosa, and tongue normal  NECK: supple, thyroid normal size, non-tender, without nodularity LYMPH:  no palpable lymphadenopathy in the cervical, axillary or inguinal LUNGS: clear to auscultation and percussion with normal breathing effort HEART: regular rate & rhythm and no murmurs and no lower extremity edema ABDOMEN:abdomen soft, non-tender and normal bowel sounds Musculoskeletal:no cyanosis of digits  and no clubbing  PSYCH: alert & oriented x 3 with fluent speech NEURO: no focal motor/sensory deficits  LABORATORY DATA:  I have reviewed the data as listed Lab Results  Component Value Date   WBC  8.8 12/25/2022   HGB 13.9 12/25/2022   HCT 42.2 12/25/2022   MCV 87.4 12/25/2022   PLT 226 12/25/2022   Recent Labs    12/19/22 1840 12/20/22 0623 12/21/22 0532 12/22/22 0500  NA 130* 132* 130* 130*  K 3.7 3.3* 3.8 3.8  CL 93* 97* 97* 95*  CO2 24 27 26 25   GLUCOSE 120* 91 133* 160*  BUN 12 11 16 18   CREATININE 0.86 0.76 1.61 0.77  CALCIUM 9.1 8.5* 8.7* 8.8*  GFRNONAA >60 >60 >60 >60  PROT 7.0 5.9*  --   --   ALBUMIN 3.8 3.2*  --   --   AST 84* 79*  --   --   ALT 62* 57*  --   --   ALKPHOS 250* 207*  --   --   BILITOT 1.5* 0.9  --   --     RADIOGRAPHIC STUDIES: I have personally reviewed the radiological images as listed and agreed with the findings in the report. US THORACENTESIS ASP PLEURAL SPACE W/IMG GUIDE  Result Date: 12/26/2022 INDICATION: Right Therapeutic Thoracentesis for pleural effusion. Patient has history of right side lung mass EXAM: ULTRASOUND GUIDED RIGHT THORACENTESIS MEDICATIONS: 10 cc's of 1% Lidocaine COMPLICATIONS: None immediate. PROCEDURE: An ultrasound guided thoracentesis was thoroughly discussed with the patient and questions answered. The benefits, risks, alternatives and complications were also discussed. The patient understands and wishes to proceed with the procedure. Written consent was obtained. Ultrasound was performed to localize and mark an adequate pocket of fluid in the right chest. The area was then prepped and draped in the normal sterile fashion. 1% Lidocaine was used for local anesthesia. Under ultrasound guidance a 8 Fr Safe-T-Centesis catheter was introduced. Thoracentesis was performed. The catheter was removed and a dressing applied. FINDINGS: A total of approximately 1.65 Liters of clear yellow fluid was removed. IMPRESSION: Successful  ultrasound guided right thoracentesis yielding 1.65 Liters of pleural fluid. Performed by Ardith Dark, NP-C Electronically Signed   By: Olive Bass M.D.   On: 12/26/2022 11:58   DG Chest Port 1 View  Result Date: 12/26/2022 CLINICAL DATA:  post right thoracentesis EXAM: PORTABLE CHEST 1 VIEW COMPARISON:  Multiple priors FINDINGS: Improved aeration of the right lower lung with residual small to moderate right effusion status post thoracentesis. The heart appears at the upper limit of normal for size. The left lung remains relatively clear. No pneumothorax. IMPRESSION: Improved aeration of the right lower lung with residual small to moderate right effusion. No pneumothorax. Electronically Signed   By: Olive Bass M.D.   On: 12/26/2022 11:33   DG Chest 2 View  Result Date: 12/26/2022 CLINICAL DATA:  shortness of breath EXAM: CHEST - 2 VIEW COMPARISON:  Multiple priors FINDINGS: The heart appears near the upper limit of normal for size. Dense opacification of the right lower lung. Left lung remains relatively clear. No acute osseous abnormality. IMPRESSION: Dense opacification of the right lower lung, probable combination of at least moderate effusion and associated atelectasis/consolidation. Electronically Signed   By: Olive Bass M.D.   On: 12/26/2022 11:32   DG Chest Port 1 View  Result Date: 12/21/2022 CLINICAL DATA:  Status post thoracentesis. EXAM: PORTABLE CHEST 1 VIEW COMPARISON:  Chest radiograph dated 12/19/2022. FINDINGS: Slight interval decrease in the size of right pleural effusion since the earlier radiograph. Residual small pleural effusion and right lung base atelectasis. Pneumonia is not excluded. The left lung is clear. There is no pneumothorax. Stable cardiac silhouette. No acute  osseous pathology. IMPRESSION: Slight interval decrease in the size of right pleural effusion. No pneumothorax. Electronically Signed   By: Elgie Collard M.D.   On: 12/21/2022 21:15   DG Chest Port  1 View  Result Date: 12/19/2022 CLINICAL DATA:  Shortness of breath, weakness EXAM: PORTABLE CHEST 1 VIEW COMPARISON:  12/16/2022 FINDINGS: Stable cardiomegaly. Slightly increased moderate right pleural effusion and associated airspace opacities. The left lung is clear. No pneumothorax. IMPRESSION: Increased moderate right pleural effusion and associated atelectasis or pneumonia. Electronically Signed   By: Minerva Fester M.D.   On: 12/19/2022 21:58   US Venous Img Lower Bilateral (DVT)  Result Date: 12/16/2022 CLINICAL DATA:  Swelling EXAM: BILATERAL LOWER EXTREMITY VENOUS DOPPLER ULTRASOUND TECHNIQUE: Gray-scale sonography with compression, as well as color and duplex ultrasound, were performed to evaluate the deep venous system(s) from the level of the common femoral vein through the popliteal and proximal calf veins. COMPARISON:  03/28/2019 FINDINGS: VENOUS Normal compressibility of the common femoral, superficial femoral, and popliteal veins, as well as the visualized calf veins. Visualized portions of profunda femoral vein and great saphenous vein unremarkable. No filling defects to suggest DVT on grayscale or color Doppler imaging. Doppler waveforms show normal direction of venous flow, normal respiratory plasticity and response to augmentation. OTHER None. Limitations: none IMPRESSION: 1. No evidence of deep venous thrombosis within either lower extremity. Electronically Signed   By: Sharlet Salina M.D.   On: 12/16/2022 19:59   DG Chest Port 1 View  Result Date: 12/16/2022 CLINICAL DATA:  Pleural effusion EXAM: PORTABLE CHEST 1 VIEW COMPARISON:  12/15/2022 FINDINGS: The heart size and mediastinal contours are within normal limits. Slightly diminished, moderate, layering right pleural effusion and associated atelectasis or consolidation. No left pleural effusion. Left lung normally aerated. The visualized skeletal structures are unremarkable. IMPRESSION: Slightly diminished, moderate, layering  right pleural effusion and associated atelectasis or consolidation. No left pleural effusion. Electronically Signed   By: Jearld Lesch M.D.   On: 12/16/2022 17:24   MR BRAIN W WO CONTRAST  Result Date: 12/15/2022 CLINICAL DATA:  Salivary ductal carcinoma. Assessment for metastatic disease. EXAM: MRI HEAD WITHOUT AND WITH CONTRAST TECHNIQUE: Multiplanar, multiecho pulse sequences of the brain and surrounding structures were obtained without and with intravenous contrast. CONTRAST:  10mL GADAVIST GADOBUTROL 1 MMOL/ML IV SOLN COMPARISON:  None Available. FINDINGS: Brain: No acute infarct, mass effect or extra-axial collection. No acute or chronic hemorrhage. There is multifocal hyperintense T2-weighted signal within the white matter. Parenchymal volume and CSF spaces are normal. Vascular: Abnormal left vertebral artery flow void. Skull and upper cervical spine: Normal calvarium and skull base. Visualized upper cervical spine and soft tissues are normal. Sinuses/Orbits:Left mastoid effusion. Paranasal sinuses are clear. Ocular lens replacements. IMPRESSION: 1. No intracranial metastatic disease. 2. Abnormal left vertebral artery flow void, consistent with slow flow or occlusion. 3. Left mastoid effusion. Electronically Signed   By: Deatra Robinson M.D.   On: 12/15/2022 23:30   CT Soft Tissue Neck W Contrast  Result Date: 12/15/2022 CLINICAL DATA:  Soft tissue infection suspected, neck, xray done h/o L parotid cancer, change of voice, touble swallowing. EXAM: CT NECK WITH CONTRAST TECHNIQUE: Multidetector CT imaging of the neck was performed using the standard protocol following the bolus administration of intravenous contrast. RADIATION DOSE REDUCTION: This exam was performed according to the departmental dose-optimization program which includes automated exposure control, adjustment of the mA and/or kV according to patient size and/or use of iterative reconstruction technique. CONTRAST:   OMNIPAQUE  IOHEXOL 350 MG/ML SOLN COMPARISON:  Neck CT 05/09/2018. FINDINGS: Pharynx and larynx: Normal. No mass or swelling.  Glottis is closed. Salivary glands: Prior left parotidectomy. Atrophy of the left submandibular gland, likely posttreatment. Normal right parotid and submandibular gland. Thyroid: Normal. Lymph nodes: Prior selective left neck dissection. No suspicious cervical lymphadenopathy. Vascular: Atherosclerotic calcifications of the carotid bulbs. Limited intracranial: Unremarkable. Visualized orbits: Unremarkable. Mastoids and visualized paranasal sinuses: Well aerated. Skeleton: Diffuse idiopathic skeletal hyperostosis with prominent anterior osteophytes at T2-3. Upper chest: Lobulated mass in the anterior right upper lobe segment with possible extrapleural extension and mediastinal invasion, measuring up to 4.1 x 3.6 cm (axial image 135 series 5), suspicious for malignancy. Moderate right pleural effusion. Other: None. IMPRESSION: 1. Lobulated mass in the anterior right upper lobe segment with possible extrapleural extension and mediastinal invasion, measuring up to 4.1 cm, suspicious for malignancy. Dedicated chest CT is recommended for further evaluation. 2. Moderate right pleural effusion. 3. Prior left parotidectomy and selective left neck dissection. No suspicious cervical lymphadenopathy. Electronically Signed   By: Orvan Falconer M.D.   On: 12/15/2022 16:28   CT Head Wo Contrast  Result Date: 12/15/2022 CLINICAL DATA:  Mental status change, unknown cause. EXAM: CT HEAD WITHOUT CONTRAST TECHNIQUE: Contiguous axial images were obtained from the base of the skull through the vertex without intravenous contrast. RADIATION DOSE REDUCTION: This exam was performed according to the departmental dose-optimization program which includes automated exposure control, adjustment of the mA and/or kV according to patient size and/or use of iterative reconstruction technique. COMPARISON:  None Available.  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 Gray-white differentiation is otherwise preserved. Mild patchy hypoattenuation of the periventricular white matter, most consistent with mild chronic small-vessel disease. No hydrocephalus or extra-axial collection. No mass effect or midline shift. Vascular: No hyperdense vessel or unexpected calcification. Skull: No calvarial fracture or suspicious bone lesion. Skull base is unremarkable. Sinuses/Orbits: No acute findings. Other: None. IMPRESSION: 1. 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 Consider MRI for further evaluation. 2. Mild chronic small-vessel disease. Electronically Signed   By: Orvan Falconer M.D.   On: 12/15/2022 16:16   CT Angio Chest Pulmonary Embolism (PE) W or WO Contrast  Result Date: 12/15/2022 CLINICAL DATA:  82 year old male with history of increasing shortness of breath. Evaluate for pulmonary embolism. EXAM: CT ANGIOGRAPHY CHEST WITH CONTRAST TECHNIQUE: Multidetector CT imaging of the chest was performed using the standard protocol during bolus administration of intravenous contrast. Multiplanar CT image reconstructions and MIPs were obtained to evaluate the vascular anatomy. RADIATION DOSE REDUCTION: This exam was performed according to the departmental dose-optimization program which includes automated exposure control, adjustment of the mA and/or kV according to patient size and/or use of iterative reconstruction technique. CONTRAST:  OMNIPAQUE IOHEXOL 350 MG/ML SOLN COMPARISON:  No priors. FINDINGS: Cardiovascular: Study is slightly limited by patient respiratory motion and suboptimal contrast bolus. With these limitations in mind there is no definite central, lobar or segmental sized filling defect in the pulmonary arterial tree to indicate clinically significant pulmonary embolism. However, there does appear to be a subsegmental sized embolus to the left upper lobe  (axial image 182 of series 6) which appears either occlusive or nearly completely occlusive. Heart size is normal. Small amount of pericardial fluid and/or thickening. No pericardial calcification. There is aortic atherosclerosis, as well as atherosclerosis of the great vessels of the mediastinum and the coronary arteries, including calcified atherosclerotic plaque in the left main,  left anterior descending, left circumflex and right coronary arteries. Thickening and calcification of the aortic valve. Mediastinum/Nodes: No pathologically enlarged mediastinal or hilar lymph nodes. Esophagus is unremarkable in appearance. No axillary lymphadenopathy. Lungs/Pleura: Multiple nodular and mass-like areas of architectural distortion are noted in the anterior aspect of the right upper lobe, largest of which (axial image 87 of series 7) is estimated to measure approximately 4.4 x 3.6 cm and has macrolobulated and slightly spiculated margins, concerning for neoplasm. Dependent areas of atelectasis are also noted in the right lung. Consolidative changes are also noted in the base of the right lower lobe. Left lung appears clear. Large right pleural effusion lying dependently. Upper Abdomen: Visualized portions of the liver have a shrunken appearance and nodular contour, suggesting underlying cirrhosis. Musculoskeletal: There are no aggressive appearing lytic or blastic lesions noted in the visualized portions of the skeleton. Review of the MIP images confirms the above findings. IMPRESSION: 1. Study is positive for a small subsegmental sized embolus to the left upper lobe, as above. 2. Importantly, however, there are nodular and mass-like regions in the right lung, most notable for a 4.4 x 3.6 cm mass in the anterior aspect of the right upper lobe concerning for potential neoplasm. Associated with this is a large right pleural effusion which may be malignant. Further clinical evaluation and consideration for follow-up PET-CT  in the near future is strongly recommended to better evaluate these findings. 3. This right pleural effusion is associated with considerable areas of atelectasis in the right lung, as well as some consolidative changes in the basal segments of the right lower lobe concerning for pneumonia. 4. Aortic atherosclerosis, in addition to left main and three-vessel coronary artery disease. Please note that although the presence of coronary artery calcium documents the presence of coronary artery disease, the severity of this disease and any potential stenosis cannot be assessed on this non-gated CT examination. Assessment for potential risk factor modification, dietary therapy or pharmacologic therapy may be warranted, if clinically indicated. 5. There are calcifications of the aortic valve. Echocardiographic correlation for evaluation of potential valvular dysfunction may be warranted if clinically indicated. Aortic Atherosclerosis (ICD10-I70.0). Electronically Signed   By: Trudie Reed M.D.   On: 12/15/2022 16:01   DG Chest 2 View  Result Date: 12/15/2022 CLINICAL DATA:  Shortness of breath. EXAM: CHEST - 2 VIEW COMPARISON:  None Available. FINDINGS: The heart size appears enlarged. There is obscuration of the right cardiac silhouette. Moderate-sized right pleural effusion with associated basilar atelectasis. Bilateral central perihilar prominence. No pneumothorax. No acute osseous abnormality. IMPRESSION: 1. Moderate-sized right pleural effusion with associated basilar atelectasis. 2. Patchy right middle and lower lung zone opacities may represent an infectious/inflammatory etiology. Electronically Signed   By: Hart Robinsons M.D.   On: 12/15/2022 15:27

## 2022-12-29 NOTE — Assessment & Plan Note (Signed)
Heparin gtt per pharmacy protocol.  Aptt ordered.

## 2022-12-29 NOTE — Assessment & Plan Note (Signed)
Sinus rhythm on auscultation.  Cont with heparin drip.  CHA2DS2/VAS Stroke Risk Points  Current as of 15 minutes ago     8 >= 2 Points: High Risk  1 to 1.99 Points: Medium Risk  0 Points: Low Risk    No Change      Details    This score determines the patient's risk of having a stroke if the  patient has atrial fibrillation.       Points Metrics  1 Has Congestive Heart Failure:  Yes    Current as of 15 minutes ago  1 Has Vascular Disease:  Yes    Current as of 15 minutes ago  1 Has Hypertension:  Yes    Current as of 15 minutes ago  2 Age:  82    Current as of 15 minutes ago  1 Has Diabetes:  Yes    Current as of 15 minutes ago  2 Had Stroke:  Yes  Had TIA:  Yes  Had Thromboembolism:  No    Current as of 15 minutes ago  0 Male:  No    Current as of 15 minutes ago

## 2022-12-29 NOTE — Telephone Encounter (Addendum)
Per JP verbally- cancel 12/29/2022 appt. Patient's family is also concerned about wife transporting patient to appointments. I have spoken to patient's daughter, Cathy(DPR) and suggested that she contact Medicare and ask if they offer transportation services.  Patient will have CXR when he comes to oncology appt.

## 2022-12-30 DIAGNOSIS — J1282 Pneumonia due to coronavirus disease 2019: Secondary | ICD-10-CM | POA: Diagnosis present

## 2022-12-30 DIAGNOSIS — E8721 Acute metabolic acidosis: Secondary | ICD-10-CM | POA: Diagnosis present

## 2022-12-30 DIAGNOSIS — E669 Obesity, unspecified: Secondary | ICD-10-CM | POA: Diagnosis present

## 2022-12-30 DIAGNOSIS — R627 Adult failure to thrive: Secondary | ICD-10-CM | POA: Diagnosis present

## 2022-12-30 DIAGNOSIS — C07 Malignant neoplasm of parotid gland: Secondary | ICD-10-CM | POA: Diagnosis present

## 2022-12-30 DIAGNOSIS — J90 Pleural effusion, not elsewhere classified: Secondary | ICD-10-CM | POA: Diagnosis present

## 2022-12-30 DIAGNOSIS — U071 COVID-19: Secondary | ICD-10-CM | POA: Diagnosis present

## 2022-12-30 DIAGNOSIS — E871 Hypo-osmolality and hyponatremia: Secondary | ICD-10-CM | POA: Diagnosis present

## 2022-12-30 DIAGNOSIS — F419 Anxiety disorder, unspecified: Secondary | ICD-10-CM | POA: Diagnosis present

## 2022-12-30 DIAGNOSIS — I110 Hypertensive heart disease with heart failure: Secondary | ICD-10-CM | POA: Diagnosis present

## 2022-12-30 DIAGNOSIS — E785 Hyperlipidemia, unspecified: Secondary | ICD-10-CM | POA: Diagnosis present

## 2022-12-30 DIAGNOSIS — N179 Acute kidney failure, unspecified: Secondary | ICD-10-CM | POA: Diagnosis present

## 2022-12-30 DIAGNOSIS — I482 Chronic atrial fibrillation, unspecified: Secondary | ICD-10-CM | POA: Diagnosis present

## 2022-12-30 DIAGNOSIS — E1165 Type 2 diabetes mellitus with hyperglycemia: Secondary | ICD-10-CM | POA: Diagnosis present

## 2022-12-30 DIAGNOSIS — Z66 Do not resuscitate: Secondary | ICD-10-CM | POA: Diagnosis present

## 2022-12-30 DIAGNOSIS — Z7189 Other specified counseling: Secondary | ICD-10-CM | POA: Diagnosis not present

## 2022-12-30 DIAGNOSIS — I5032 Chronic diastolic (congestive) heart failure: Secondary | ICD-10-CM | POA: Diagnosis present

## 2022-12-30 DIAGNOSIS — G8929 Other chronic pain: Secondary | ICD-10-CM | POA: Diagnosis present

## 2022-12-30 DIAGNOSIS — E875 Hyperkalemia: Secondary | ICD-10-CM | POA: Diagnosis present

## 2022-12-30 DIAGNOSIS — Z515 Encounter for palliative care: Secondary | ICD-10-CM | POA: Diagnosis not present

## 2022-12-30 DIAGNOSIS — C089 Malignant neoplasm of major salivary gland, unspecified: Secondary | ICD-10-CM | POA: Diagnosis present

## 2022-12-30 DIAGNOSIS — K7290 Hepatic failure, unspecified without coma: Secondary | ICD-10-CM | POA: Diagnosis present

## 2022-12-30 DIAGNOSIS — J910 Malignant pleural effusion: Secondary | ICD-10-CM | POA: Diagnosis present

## 2022-12-30 DIAGNOSIS — I2510 Atherosclerotic heart disease of native coronary artery without angina pectoris: Secondary | ICD-10-CM | POA: Diagnosis present

## 2022-12-30 DIAGNOSIS — I3139 Other pericardial effusion (noninflammatory): Secondary | ICD-10-CM | POA: Diagnosis present

## 2022-12-30 DIAGNOSIS — C7800 Secondary malignant neoplasm of unspecified lung: Secondary | ICD-10-CM | POA: Diagnosis present

## 2022-12-30 LAB — CBC
HCT: 42.2 % (ref 39.0–52.0)
Hemoglobin: 14 g/dL (ref 13.0–17.0)
MCH: 28.4 pg (ref 26.0–34.0)
MCHC: 33.2 g/dL (ref 30.0–36.0)
MCV: 85.6 fL (ref 80.0–100.0)
Platelets: 278 10*3/uL (ref 150–400)
RBC: 4.93 MIL/uL (ref 4.22–5.81)
RDW: 15.7 % — ABNORMAL HIGH (ref 11.5–15.5)
WBC: 8.2 10*3/uL (ref 4.0–10.5)
nRBC: 0 % (ref 0.0–0.2)

## 2022-12-30 LAB — APTT
aPTT: >200 s (ref 24–36)
aPTT: >200 s (ref 24–36)
aPTT: >200 s (ref 24–36)

## 2022-12-30 LAB — GLUCOSE, CAPILLARY
Glucose-Capillary: 102 mg/dL — ABNORMAL HIGH (ref 70–99)
Glucose-Capillary: 110 mg/dL — ABNORMAL HIGH (ref 70–99)
Glucose-Capillary: 91 mg/dL (ref 70–99)
Glucose-Capillary: 128 mg/dL — ABNORMAL HIGH (ref 70–99)

## 2022-12-30 LAB — COMPREHENSIVE METABOLIC PANEL
ALT: 70 U/L — ABNORMAL HIGH (ref 0–44)
AST: 133 U/L — ABNORMAL HIGH (ref 15–41)
Albumin: 3.2 g/dL — ABNORMAL LOW (ref 3.5–5.0)
Alkaline Phosphatase: 308 U/L — ABNORMAL HIGH (ref 38–126)
Anion gap: 13 (ref 5–15)
BUN: 57 mg/dL — ABNORMAL HIGH (ref 8–23)
CO2: 24 mmol/L (ref 22–32)
Calcium: 8.8 mg/dL — ABNORMAL LOW (ref 8.9–10.3)
Chloride: 90 mmol/L — ABNORMAL LOW (ref 98–111)
Creatinine, Ser: 1.41 mg/dL — ABNORMAL HIGH (ref 0.61–1.24)
GFR, Estimated: 50 mL/min — ABNORMAL LOW (ref >60)
Glucose, Bld: 107 mg/dL — ABNORMAL HIGH (ref 70–99)
Potassium: 4.7 mmol/L (ref 3.5–5.1)
Sodium: 127 mmol/L — ABNORMAL LOW (ref 135–145)
Total Bilirubin: 1.7 mg/dL — ABNORMAL HIGH (ref <1.2)
Total Protein: 6.1 g/dL — ABNORMAL LOW (ref 6.5–8.1)

## 2022-12-30 MED ORDER — SODIUM CHLORIDE 0.9 % IV SOLN: INTRAVENOUS | Status: AC

## 2022-12-30 MED ORDER — POLYETHYLENE GLYCOL 3350 17 G PO PACK: 17.0000 g | PACK | Freq: Every day | ORAL | Status: DC

## 2022-12-30 MED ORDER — HEPARIN (PORCINE) 25000 UT/250ML-% IV SOLN
1000.0000 [IU]/h | INTRAVENOUS | Status: DC
  Administered 2022-12-30: 1000 [IU]/h via INTRAVENOUS

## 2022-12-30 MED ORDER — HEPARIN (PORCINE) 25000 UT/250ML-% IV SOLN
1400.0000 [IU]/h | INTRAVENOUS | Status: DC
  Administered 2022-12-30 (×2): 1400 [IU]/h via INTRAVENOUS
  Filled 2022-12-30: qty 250

## 2022-12-30 NOTE — Progress Notes (Signed)
MEWS Progress Note  Patient Details Name: Erik Hendricks MRN: [id number] DOB: April 06, 1940 Today's Date: 12/30/2022   MEWS Flowsheet Documentation:  Assess: MEWS Score Temp: 98.2 F (36.8 C) BP: 112/77 MAP (mmHg): 89 Pulse Rate: 92 ECG Heart Rate: (!) 131 Resp: 16 Level of Consciousness: Responds to Voice SpO2: 93 % O2 Device: Nasal Cannula O2 Flow Rate (L/min): 2 L/min Assess: MEWS Score MEWS Temp: 0 MEWS Systolic: 0 MEWS Pulse: 3 MEWS RR: 0 MEWS LOC: 1 MEWS Score: 4 MEWS Score Color: Red Assess: SIRS CRITERIA SIRS Temperature : 0 SIRS Respirations : 0 SIRS Pulse: 1 SIRS WBC: 0 SIRS Score Sum : 1 SIRS Temperature : 0 SIRS Pulse: 1 SIRS Respirations : 0 SIRS WBC: 0 SIRS Score Sum : 1 Assess: if the MEWS score is Yellow or Red Were vital signs accurate and taken at a resting state?: Yes Does the patient meet 2 or more of the SIRS criteria?: No MEWS guidelines implemented : Yes, red Treat MEWS Interventions: Considered administering scheduled or prn medications/treatments as ordered Take Vital Signs Increase Vital Sign Frequency : Red: Q1hr x2, continue Q4hrs until patient remains green for 12hrs Escalate MEWS: Escalate: Red: Discuss with charge nurse and notify provider. Consider notifying RRT. If remains red for 2 hours consider need for higher level of care Notify: Charge Nurse/RN Name of Charge Nurse/RN Notified: Corrie Dandy, RN Provider Notification Provider Name/Title: Manuela Schwartz, NP Date Provider Notified: 12/30/22 Time Provider Notified: 2049 Method of Notification: Page Notification Reason: Change in status (RED MEWS) Date of Provider Response: 12/30/22 Time of Provider Response: 2050   Orders Received: administer scheduled metoprolol 12.5mg  PO   Ernest Mallick 12/30/2022, 8:50 PM

## 2022-12-30 NOTE — Progress Notes (Addendum)
PROGRESS NOTE    Erik Hendricks  ZOX:[id number] DOB: 1940-08-10 DOA: 12/29/2022 PCP: Erik Mina, MD  Outpatient Specialists: oncology    Brief Narrative:   Erik Hendricks is a 82 y.o. male with medical history significant for pulmonary embolism on Eliquis, recurrent metastatic high-grade salivary duct cancer, status post parotidectomy, left neck level dissection and reconstruction with left cervical facial advancement flap referred by oncology for recurrent malignant right-sided pleural effusion.  Per report patient has had significant declining functional status the past few weeks, patient initially declined Pleurx catheter.  Patient was a direct admit for initiation of heparin drip coming off of Eliquis and to get a Pleurx catheter on Monday per wife and daughter at bedside.    Assessment & Plan:   Principal Problem:   Pleural effusion on right Active Problems:   Pulmonary embolism (HCC)   Coronary artery disease   CVA (cerebral vascular accident) (HCC)   HTN (hypertension)   Salivary gland carcinoma (HCC)   Type 2 diabetes mellitus with hyperglycemia, without long-term current use of insulin (HCC)   Chronic diastolic CHF (congestive heart failure) (HCC)   Obesity (BMI 30-39.9)   COVID-19 virus infection   A-fib (HCC)   Malignant pleural effusion  # Malignant pleural effusion Has had several thoracenteses this past month. Stable on 2 liters o2 but hypoxic with minimal ambulation. Last apixaban 11/8 AM - IR will plan on placing pleurx catheter on Monday (spoke w/ dr. Archer Asa today). Will plan on stopping iv heparin morning of the procedure and npo the night before  # Metastatic  high-grade salivary duct carcinoma With malignant pleural effusion. Has established with oncology Alena Bills) outpt. Prognosis appears poor - have reached out to onc about need for any further w/u inpatient  # AKI Likely prerenal from decreased po - start IVF, further w/u if fails to  improve  # Transaminitis Possibly 2/2 dehydration, mets also a possibility - consider abdominal imaging with kidney function improves  # PE Recent, likely provoked by malignancy - continue IV heparin  # A-fib Rate controlled - continue heparin, hetop  # Chronic pain - home duloxetine, pregabalin  # T2DM Glucose appropriate - home glipizide on hold - SSI  # CAD Asymptomatic - home statin  # HFpEF Appears compensated, euvolemic - home metop  # Debility - PT consult  # End-of-life care Family is processing patient's poor prognosis, we engaged in 16 minutes of advanced care planning including discussion of code status, palliative care, hospice care, etc. Family are contemplating these approaches, for now desire full scope of care, full code.   DVT prophylaxis: iv heparin Code Status: full  Family Communication: wife and daughter updated @ bedside 11/9  Level of care: Med-Surg Status is: Observation    Consultants:  none  Procedures: pending  Antimicrobials:  none    Subjective: Reports feeling fine at rest  Objective: Vitals:   12/29/22 2134 12/30/22 0429 12/30/22 0500 12/30/22 0828  BP: 110/68 124/80  113/75  Pulse: 61 99  70  Resp: 16   18  Temp: 97.7 F (36.5 C) (!) 97.2 F (36.2 C)  98.8 F (37.1 C)  TempSrc:  Axillary    SpO2: 94% 97%  95%  Weight:   108.4 kg   Height: 6' (1.829 m)  6' (1.829 m)     Intake/Output Summary (Last 24 hours) at 12/30/2022 0925 Last data filed at 12/30/2022 0600 Gross per 24 hour  Intake 0 ml  Output --  Net 0  ml   Filed Weights   12/29/22 2100 12/30/22 0500  Weight: 109.8 kg 108.4 kg    Examination:  General exam: Appears calm and comfortable  Respiratory system: decreased sounds at bases r>l, rales at bases Cardiovascular system: S1 & S2 heard, RR  Gastrointestinal system: Abdomen is mildly distended, soft and nontender. No organomegaly or masses felt.   Central nervous system: Alert and  oriented. No focal neurological deficits. Extremities: Symmetric 5 x 5 power. Skin: No rashes, lesions or ulcers. pale Psychiatry: Judgement and insight appear normal. Mood & affect appropriate.     Data Reviewed: I have personally reviewed following labs and imaging studies  CBC: Recent Labs  Lab 12/25/22 1522 12/29/22 2127 12/30/22 0520  WBC 8.8 8.3 8.2  NEUTROABS 7.4 6.9  --   HGB 13.9 13.5 14.0  HCT 42.2 40.6 42.2  MCV 87.4 85.3 85.6  PLT 226 272 278   Basic Metabolic Panel: Recent Labs  Lab 12/29/22 2127 12/30/22 0520  NA 126* 127*  K 4.6 4.7  CL 90* 90*  CO2 25 24  GLUCOSE 132* 107*  BUN 55* 57*  CREATININE 1.53* 1.41*  CALCIUM 8.9 8.8*   GFR: Estimated Creatinine Clearance: 53.1 mL/min (A) (by C-G formula based on SCr of 1.41 mg/dL (H)). Liver Function Tests: Recent Labs  Lab 12/29/22 2127 12/30/22 0520  AST 135* 133*  ALT 71* 70*  ALKPHOS 320* 308*  BILITOT 1.8* 1.7*  PROT 6.4* 6.1*  ALBUMIN 3.2* 3.2*   No results for input(s): "LIPASE", "AMYLASE" in the last 168 hours. No results for input(s): "AMMONIA" in the last 168 hours. Coagulation Profile: Recent Labs  Lab 12/29/22 2133  INR 2.6*   Cardiac Enzymes: No results for input(s): "CKTOTAL", "CKMB", "CKMBINDEX", "TROPONINI" in the last 168 hours. BNP (last 3 results) No results for input(s): "PROBNP" in the last 8760 hours. HbA1C: No results for input(s): "HGBA1C" in the last 72 hours. CBG: Recent Labs  Lab 12/29/22 2117 12/30/22 0826  GLUCAP 135* 102*   Lipid Profile: No results for input(s): "CHOL", "HDL", "LDLCALC", "TRIG", "CHOLHDL", "LDLDIRECT" in the last 72 hours. Thyroid Function Tests: No results for input(s): "TSH", "T4TOTAL", "FREET4", "T3FREE", "THYROIDAB" in the last 72 hours. Anemia Panel: No results for input(s): "VITAMINB12", "FOLATE", "FERRITIN", "TIBC", "IRON", "RETICCTPCT" in the last 72 hours. Urine analysis:    Component Value Date/Time   APPEARANCEUR Clear  10/18/2015 1110   GLUCOSEU Negative 10/18/2015 1110   BILIRUBINUR Negative 10/18/2015 1110   PROTEINUR Negative 10/18/2015 1110   NITRITE Negative 10/18/2015 1110   LEUKOCYTESUR Negative 10/18/2015 1110   Sepsis Labs: @LABRCNTIP (procalcitonin:4,lacticidven:4)  ) Recent Results (from the past 240 hour(s))  Body fluid culture w Gram Stain     Status: None   Collection Time: 12/21/22  4:37 PM   Specimen: Pleura  Result Value Ref Range Status   Specimen Description   Final    PLEURAL Performed at [facility], [address] [place], [place] [address]    Special Requests   Final    NONE Performed at [facility] [address] [place], [place] [address]    Gram Stain NO WBC SEEN NO ORGANISMS SEEN   Final   Culture   Final    NO GROWTH 3 DAYS Performed at [facility], [address] [place], [place] [address]    Report Status 12/25/2022 FINAL  Final         Radiology Studies: No results found.  Scheduled Meds:  balsalazide  1,500 mg Oral BID   DULoxetine  60 mg Oral Daily   fluticasone  1 spray Each Nare Daily   insulin aspart  0-9 Units Subcutaneous TID WC   metoprolol tartrate  12.5 mg Oral BID   pregabalin  150 mg Oral BID   sodium chloride flush  3 mL Intravenous Q12H   Continuous Infusions:  heparin       LOS: 1 day     Silvano Bilis, MD [facility]   If 7PM-7AM, please contact night-coverage [email or web address] Password [email or web address] 12/30/2022, 9:25 AM

## 2022-12-30 NOTE — Plan of Care (Signed)
  Problem: Coping: Goal: Psychosocial and spiritual needs will be supported Outcome: Progressing   Problem: Respiratory: Goal: Will maintain a patent airway Outcome: Progressing Goal: Complications related to the disease process, condition or treatment will be avoided or minimized Outcome: Progressing   Problem: Education: Goal: Knowledge of General Education information will improve Description: Including pain rating scale, medication(s)/side effects and non-pharmacologic comfort measures Outcome: Progressing   Problem: Health Behavior/Discharge Planning: Goal: Ability to manage health-related needs will improve Outcome: Progressing   Problem: Clinical Measurements: Goal: Ability to maintain clinical measurements within normal limits will improve Outcome: Progressing Goal: Will remain free from infection Outcome: Progressing Goal: Diagnostic test results will improve Outcome: Progressing Goal: Respiratory complications will improve Outcome: Progressing Goal: Cardiovascular complication will be avoided Outcome: Progressing   Problem: Activity: Goal: Risk for activity intolerance will decrease Outcome: Progressing   Problem: Nutrition: Goal: Adequate nutrition will be maintained Outcome: Progressing   Problem: Coping: Goal: Level of anxiety will decrease Outcome: Progressing   Problem: Elimination: Goal: Will not experience complications related to bowel motility Outcome: Progressing Goal: Will not experience complications related to urinary retention Outcome: Progressing   Problem: Pain Management: Goal: General experience of comfort will improve Outcome: Progressing   Problem: Safety: Goal: Ability to remain free from injury will improve Outcome: Progressing   Problem: Skin Integrity: Goal: Risk for impaired skin integrity will decrease Outcome: Progressing   Problem: Education: Goal: Ability to describe self-care measures that may prevent or decrease  complications (Diabetes Survival Skills Education) will improve Outcome: Progressing Goal: Individualized Educational Video(s) Outcome: Progressing   Problem: Coping: Goal: Ability to adjust to condition or change in health will improve Outcome: Progressing   Problem: Fluid Volume: Goal: Ability to maintain a balanced intake and output will improve Outcome: Progressing   Problem: Health Behavior/Discharge Planning: Goal: Ability to identify and utilize available resources and services will improve Outcome: Progressing Goal: Ability to manage health-related needs will improve Outcome: Progressing   Problem: Metabolic: Goal: Ability to maintain appropriate glucose levels will improve Outcome: Progressing   Problem: Nutritional: Goal: Maintenance of adequate nutrition will improve Outcome: Progressing Goal: Progress toward achieving an optimal weight will improve Outcome: Progressing   Problem: Skin Integrity: Goal: Risk for impaired skin integrity will decrease Outcome: Progressing   Problem: Tissue Perfusion: Goal: Adequacy of tissue perfusion will improve Outcome: Progressing

## 2022-12-30 NOTE — TOC CM/SW Note (Signed)
Transition of Care [facility]) - Inpatient Brief Assessment   Patient Details  Name: Erik Hendricks MRN: [id number] Date of Birth: 20-Feb-1941  Transition of Care [facility]) CM/SW Contact:    Kemper Durie, RN Phone Number: 12/30/2022, 1:58 PM   Clinical Narrative:  Brief assessment done, 3 admissions in less than a month, PT eval pending.   Transition of Care Asessment: Insurance and Status: Insurance coverage has been reviewed Patient has primary care physician: Yes Home environment has been reviewed: Home with wife Prior level of function:: Independent Prior/Current Home Services: No current home services Social Determinants of Health Reivew: SDOH reviewed no interventions necessary Readmission risk has been reviewed: Yes Transition of care needs: transition of care needs identified, TOC will continue to follow

## 2022-12-30 NOTE — Plan of Care (Signed)
  Problem: Coping: Goal: Psychosocial and spiritual needs will be supported Outcome: Progressing   Problem: Respiratory: Goal: Will maintain a patent airway Outcome: Progressing   Problem: Clinical Measurements: Goal: Ability to maintain clinical measurements within normal limits will improve Outcome: Progressing Goal: Will remain free from infection Outcome: Progressing Goal: Diagnostic test results will improve Outcome: Progressing Goal: Respiratory complications will improve Outcome: Progressing Goal: Cardiovascular complication will be avoided Outcome: Progressing

## 2022-12-30 NOTE — Progress Notes (Signed)
Patient's HR fluctuating on the dinamap from 90s-130s. Patient denies chest pain and denies palpitations.   Patient's EKG on 12/29/22 shows Afib RVR. Patient has history of Afib  RN notified Manuela Schwartz, NP of the above information.   Orders Received: Telemetry monitoring

## 2022-12-30 NOTE — Progress Notes (Signed)
PHARMACY - ANTICOAGULATION CONSULT NOTE  Pharmacy Consult for Heparin Infusion Indication: pulmonary embolus  Allergies  Allergen Reactions   Ciprofloxacin Nausea Only    Patient Measurements: Height: 6' (182.9 cm) Weight: 108.4 kg (238 lb 15.7 oz) IBW/kg (Calculated) : 77.6 Heparin Dosing Weight: 100.9 kg  Vital Signs: Temp: 97.7 F (36.5 C) (11/09 1651) Temp Source: Oral (11/09 1651) BP: 113/74 (11/09 1651) Pulse Rate: 62 (11/09 1651)  Labs: Recent Labs    12/29/22 2127 12/29/22 2133 12/30/22 0520 12/30/22 1644 12/30/22 1758  HGB 13.5  --  14.0  --   --   HCT 40.6  --  42.2  --   --   PLT 272  --  278  --   --   APTT 52*  --  >200* >200* >200*  LABPROT  --  27.7*  --   --   --   INR  --  2.6*  --   --   --   HEPARINUNFRC  --  >1.10*  --   --   --   CREATININE 1.53*  --  1.41*  --   --     Estimated Creatinine Clearance: 53.1 mL/min (A) (by C-G formula based on SCr of 1.41 mg/dL (H)).   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 He presented to [facility] with recurrent right pleural effusion and was noted to have a pulmonary embolism. Pharmacy was consulted to initiate patient on a heparin infusion.  Of note, patient does take apixaban at home, with his last dose being 11/8 AM.  Baseline heparin level, INR, and aPTT ordered.  No signs/symptoms of bleeding noted in chart. Baseline CBC ordered.  11/9 0520  aPTT>200, SUPRAtherapeutic  1600>1400  Goal of Therapy:  Heparin level 0.3-0.7  units/ml aPTT 66-102 seconds Monitor platelets by anticoagulation protocol: Yes   Plan: aPTT>200, SUPRAtherapeutic x 2. Rechecked to confirm.  Hold heparin drip x 1 hour Resume heparin infusion at lower rate of 1000 units/hr Check aPTT in 8 hours from drip restart Monitor aPTT levels until HL and aPTT correlate, then switch to HL levels Continue to monitor heparin level and CBC daily   Elliot Gurney, PharmD, BCPS Clinical Pharmacist  12/30/2022 7:24 PM

## 2022-12-30 NOTE — Progress Notes (Signed)
IV Team consult to place 2nd IV for heparin. Pt only receiving normal saline and heparin at this time per primary RN. These drugs are compatible and can infuse via same IV. IV Team will remain available as needed.

## 2022-12-30 NOTE — Progress Notes (Signed)
PHARMACY - ANTICOAGULATION CONSULT NOTE  Pharmacy Consult for Heparin Infusion Indication: pulmonary embolus  Allergies  Allergen Reactions   Ciprofloxacin Nausea Only    Patient Measurements: Height: 6' (182.9 cm) Weight: 108.4 kg (238 lb 15.7 oz) IBW/kg (Calculated) : 77.6 Heparin Dosing Weight: 100.9 kg  Vital Signs: Temp: 97.2 F (36.2 C) (11/09 0429) Temp Source: Axillary (11/09 0429) BP: 124/80 (11/09 0429) Pulse Rate: 99 (11/09 0429)  Labs: Recent Labs    12/29/22 2127 12/29/22 2133 12/30/22 0520  HGB 13.5  --  14.0  HCT 40.6  --  42.2  PLT 272  --  278  APTT 52*  --  >200*  LABPROT  --  27.7*  --   INR  --  2.6*  --   HEPARINUNFRC  --  >1.10*  --   CREATININE 1.53*  --  1.41*    Estimated Creatinine Clearance: 53.1 mL/min (A) (by C-G formula based on SCr of 1.41 mg/dL (H)).   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 He presented to [facility] with recurrent right pleural effusion and was noted to have a pulmonary embolism. Pharmacy was consulted to initiate patient on a heparin infusion.  Of note, patient does take apixaban at home, with his last dose being 11/8 AM.  Baseline heparin level, INR, and aPTT ordered.  No signs/symptoms of bleeding noted in chart. Baseline CBC ordered.  11/9 0520  aPTT>200, SUPRAtherapeutic  1600>1400  Goal of Therapy:  Heparin level 0.3-0.7 units/ml aPTT 66-102 seconds Monitor platelets by anticoagulation protocol: Yes   Plan:  11/9 0520   aPTT>200, SUPRAtherapeutic Hold heparin drip x 1 hour Resume heparin infusion at lower rate of 1400 units/hr Check aPTT in 8 hours from drip restart Monitor aPTT levels until HL and aPTT correlate, then switch to HL levels Continue to monitor heparin level and CBC daily   Angelique Blonder, PharmD Clinical Pharmacist 12/30/2022,8:01 AM

## 2022-12-31 ENCOUNTER — Inpatient Hospital Stay: Payer: 59

## 2022-12-31 DIAGNOSIS — J90 Pleural effusion, not elsewhere classified: Secondary | ICD-10-CM | POA: Diagnosis not present

## 2022-12-31 LAB — CBC
HCT: 42.1 % (ref 39.0–52.0)
Hemoglobin: 13.9 g/dL (ref 13.0–17.0)
MCH: 28.3 pg (ref 26.0–34.0)
MCHC: 33 g/dL (ref 30.0–36.0)
MCV: 85.6 fL (ref 80.0–100.0)
Platelets: 282 10*3/uL (ref 150–400)
RBC: 4.92 MIL/uL (ref 4.22–5.81)
RDW: 15.9 % — ABNORMAL HIGH (ref 11.5–15.5)
WBC: 9.2 10*3/uL (ref 4.0–10.5)
nRBC: 0 % (ref 0.0–0.2)

## 2022-12-31 LAB — BRAIN NATRIURETIC PEPTIDE: B Natriuretic Peptide: 62.2 pg/mL (ref 0.0–100.0)

## 2022-12-31 LAB — COMPREHENSIVE METABOLIC PANEL
ALT: 90 U/L — ABNORMAL HIGH (ref 0–44)
AST: 319 U/L — ABNORMAL HIGH (ref 15–41)
Albumin: 3.3 g/dL — ABNORMAL LOW (ref 3.5–5.0)
Alkaline Phosphatase: 308 U/L — ABNORMAL HIGH (ref 38–126)
Anion gap: 12 (ref 5–15)
BUN: 69 mg/dL — ABNORMAL HIGH (ref 8–23)
CO2: 21 mmol/L — ABNORMAL LOW (ref 22–32)
Calcium: 8.6 mg/dL — ABNORMAL LOW (ref 8.9–10.3)
Chloride: 94 mmol/L — ABNORMAL LOW (ref 98–111)
Creatinine, Ser: 2.28 mg/dL — ABNORMAL HIGH (ref 0.61–1.24)
GFR, Estimated: 28 mL/min — ABNORMAL LOW (ref >60)
Glucose, Bld: 111 mg/dL — ABNORMAL HIGH (ref 70–99)
Potassium: 5.1 mmol/L (ref 3.5–5.1)
Sodium: 127 mmol/L — ABNORMAL LOW (ref 135–145)
Total Bilirubin: 2.3 mg/dL — ABNORMAL HIGH (ref <1.2)
Total Protein: 6.4 g/dL — ABNORMAL LOW (ref 6.5–8.1)

## 2022-12-31 LAB — APTT
aPTT: >200 s (ref 24–36)
aPTT: 105 s — ABNORMAL HIGH (ref 24–36)

## 2022-12-31 LAB — GLUCOSE, CAPILLARY
Glucose-Capillary: 95 mg/dL (ref 70–99)
Glucose-Capillary: 105 mg/dL — ABNORMAL HIGH (ref 70–99)
Glucose-Capillary: 94 mg/dL (ref 70–99)
Glucose-Capillary: 104 mg/dL — ABNORMAL HIGH (ref 70–99)

## 2022-12-31 LAB — HEPARIN LEVEL (UNFRACTIONATED): Heparin Unfractionated: >1.1 [IU]/mL — ABNORMAL HIGH (ref 0.30–0.70)

## 2022-12-31 MED ORDER — METOPROLOL TARTRATE 50 MG PO TABS
50.0000 mg | ORAL_TABLET | Freq: Two times a day (BID) | ORAL | Status: DC
  Administered 2022-12-31: 50 mg via ORAL
  Filled 2022-12-31: qty 1

## 2022-12-31 MED ORDER — METOPROLOL TARTRATE 5 MG/5ML IV SOLN
5.0000 mg | Freq: Once | INTRAVENOUS | Status: AC
  Administered 2022-12-31: 5 mg via INTRAVENOUS
  Filled 2022-12-31: qty 5

## 2022-12-31 MED ORDER — METOPROLOL TARTRATE 50 MG PO TABS
50.0000 mg | ORAL_TABLET | Freq: Once | ORAL | Status: AC
  Administered 2022-12-31: 50 mg via ORAL
  Filled 2022-12-31: qty 1

## 2022-12-31 MED ORDER — HEPARIN (PORCINE) 25000 UT/250ML-% IV SOLN
650.0000 [IU]/h | INTRAVENOUS | Status: DC
  Administered 2022-12-31: 750 [IU]/h via INTRAVENOUS
  Administered 2022-12-31: 650 [IU]/h via INTRAVENOUS
  Filled 2022-12-31: qty 250

## 2022-12-31 MED ORDER — METOPROLOL TARTRATE 50 MG PO TABS: 100.0000 mg | ORAL_TABLET | Freq: Two times a day (BID) | ORAL | Status: DC

## 2022-12-31 MED ORDER — SODIUM CHLORIDE 0.9 % IV SOLN
INTRAVENOUS | Status: DC
Start: 2022-12-31 — End: 2023-01-01

## 2022-12-31 MED ORDER — METOPROLOL TARTRATE 5 MG/5ML IV SOLN: 5.0000 mg | INTRAVENOUS | Status: DC | PRN

## 2022-12-31 NOTE — H&P (View-Only) (Signed)
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   After he was discharged earlier this month, he has not been having anything PO, significant weakness and failure to thrive. He was also found to have COVID-19 pneumonia. He went to visit his oncologist on 11/08/204 and was found to be significantly weak and therefore was referred to the ED. His CXR showed recurrent of his right pleural effusion.   On encounter, he appeared fatigued, wanting to sleep and not very interactive. His respiratory status was acceptable.   Physical exam:  GEN: Lethargic fatigued but wakes up to voice HEENT Supple neck, reactive pupils  CVS Normal S1, Normal S2, RRR  Pulmonary: Diminished breath sounds over the right hemithorax  Abdomen: Soft, non tender, non distended Extremities Warm and well perfused.   Labs and imaging were reviewd.   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   Ongoing discussions regarding plan of care and moving towards hospice with family.  For now plan to proceed with palliative Pleurx placement tomorrw.   []  Keep NPO after midnight  []  Please hold Heparin drip at 4am.  []  Plan for PleurX placement tomorrow at 10:30 am.   I spent 30 minutes caring for this patient today, including preparing to see the patient, obtaining a medical history , reviewing a separately obtained history, performing a medically appropriate examination and/or evaluation, counseling and educating the  patient/family/caregiver, documenting clinical information in the electronic health record, and independently interpreting results (not separately reported/billed) and communicating results to the patient/family/caregiver  [name], MD [facility] 12/31/2022 1:54 PM

## 2022-12-31 NOTE — Progress Notes (Signed)
CRITICAL VALUE STICKER  CRITICAL VALUE: aPTT >200   DATE & TIME NOTIFIED: 12/31/2022 0542  MD NOTIFIED: Manuela Schwartz, NP  TIME OF NOTIFICATION:12/31/2022 [phone]  RESPONSE:  RN to notify pharmacy  RN notified Princella Ion, [facility] of the critical aPTT  Heparins infusion discontinued by pharmacy and RN stopped and disconnected the heparin infusion from the patient.

## 2022-12-31 NOTE — Progress Notes (Signed)
PT Cancellation Note  Patient Details Name: Erik Hendricks MRN: [id number] DOB: [birth year]   Cancelled Treatment:    Reason Eval/Treat Not Completed: Patient not medically ready- Spoke with Althea Charon, RN- and she requested to hold PT as he was having some A-fib and currently resting as he is supposed to transfer to 2A. Will recheck tomorrow for any new orders.    Lenda Kelp, PT 12/31/2022, 10:51 AM

## 2022-12-31 NOTE — Progress Notes (Addendum)
       CROSS COVER NOTE  NAME: Erik Hendricks MRN: [id number] DOB : 06/02/1940    Concern as stated by nurse / staff   No urine output this shift 315 bladder volume and increased heart rate 150; aptt >200     Pertinent findings on chart review: 82 y.o. male with medical history significant for pulmonary embolism on Eliquis, recurrent metastatic high-grade salivary duct cancer, status post parotidectomy, left neck level dissection and reconstruction with left cervical facial advancement flap referred by oncology for recurrent malignant right-sided pleural effusion.    Assessment and  Interventions   Assessment:    12/31/2022    5:05 AM 12/31/2022    2:56 AM 12/30/2022   11:31 PM  Vitals with BMI  Systolic 103 107 782  Diastolic 82 75 75  Pulse 65 73 86   Oxygen sats and work of breathing stable2 L [place]    Latest Ref Rng & Units 12/31/2022    4:35 AM 12/30/2022    5:20 AM 12/29/2022    9:27 PM  CMP  Glucose 70 - 99 mg/dL 956  213  086   BUN 8 - 23 mg/dL 69  57  55   Creatinine 0.61 - 1.24 mg/dL 5.78  4.69  6.29   Sodium 135 - 145 mmol/L 127  127  126   Potassium 3.5 - 5.1 mmol/L 5.1  4.7  4.6   Chloride 98 - 111 mmol/L 94  90  90   CO2 22 - 32 mmol/L 21  24  25    Calcium 8.9 - 10.3 mg/dL 8.6  8.8  8.9   Total Protein 6.5 - 8.1 g/dL 6.4  6.1  6.4   Total Bilirubin <1.2 mg/dL 2.3  1.7  1.8   Alkaline Phos 38 - 126 U/L 308  308  320   AST 15 - 41 U/L 319  133  135   ALT 0 - 44 U/L 90  70  71     Plan: Chest xray eval for worsening pleural effusions - viewed by me - increased right pleural effusions + cardiomegaly Worsening transaminitis as well as AKI - ?cardiohepatorenal   Metoprolol 5 mg x1 Continue heparin infusion through pharmacy management Needs cardiology and palliative consult     Donnie Mesa NP [facility] Cross Cover 7pm-7am - check amion for availability Pager [phone]

## 2022-12-31 NOTE — Plan of Care (Signed)

## 2022-12-31 NOTE — Progress Notes (Signed)
PHARMACY - ANTICOAGULATION CONSULT NOTE  Pharmacy Consult for Heparin Infusion Indication: pulmonary embolus  Allergies  Allergen Reactions   Ciprofloxacin Nausea Only    Patient Measurements: Height: 6' (182.9 cm) Weight: 108.4 kg (238 lb 15.7 oz) IBW/kg (Calculated) : 77.6 Heparin Dosing Weight: 100.9 kg  Vital Signs: Temp: 97.7 F (36.5 C) (11/10 0256) Temp Source: Axillary (11/10 0256) BP: 103/82 (11/10 0505) Pulse Rate: 65 (11/10 0505)  Labs: Recent Labs    12/29/22 2127 12/29/22 2133 12/30/22 0520 12/30/22 1644 12/30/22 1758 12/31/22 0435  HGB 13.5  --  14.0  --   --  13.9  HCT 40.6  --  42.2  --   --  42.1  PLT 272  --  278  --   --  282  APTT 52*  --  >200* >200* >200* >200*  LABPROT  --  27.7*  --   --   --   --   INR  --  2.6*  --   --   --   --   HEPARINUNFRC  --  >1.10*  --   --   --  >1.10*  CREATININE 1.53*  --  1.41*  --   --  2.28*    Estimated Creatinine Clearance: 32.9 mL/min (A) (by C-G formula based on SCr of 2.28 mg/dL (H)).   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 He presented to [facility] with recurrent right pleural effusion and was noted to have a pulmonary embolism. Pharmacy was consulted to initiate patient on a heparin infusion.  Of note, patient does take apixaban at home, with his last dose being 11/8 AM.  Baseline heparin level, INR, and aPTT ordered.  No signs/symptoms of bleeding noted in chart. Baseline CBC ordered.  11/9 0520  aPTT>200,  SUPRAtherapeutic  1600>1400 11/10 0453 aPTT > 200,  HL > 1.10   Goal of Therapy:  Heparin level 0.3-0.7 units/ml aPTT 66-102 seconds Monitor platelets by anticoagulation protocol: Yes   Plan:  11/10 @ 0435:  aPTT = > 200,  HL = > 1.10 - will hold  heparin for 1 hr and resume at 750 units/hr - will recheck aPTT 8 hrs after restart - will recheck HL on 11/11 with AM labs  Monitor aPTT levels until HL and aPTT correlate, then switch to HL levels Continue to monitor heparin level and CBC daily   Kissy Cielo D Clinical Pharmacist  12/31/2022 5:46 AM

## 2022-12-31 NOTE — Progress Notes (Signed)
PROGRESS NOTE    Erik Hendricks  ZOX:[id number] DOB: February 25, 1940 DOA: 12/29/2022 PCP: [name], MD  Outpatient Specialists: oncology    Brief Narrative:   Erik Hendricks is a 82 y.o. male with medical history significant for pulmonary embolism on Eliquis, recurrent metastatic high-grade salivary duct cancer, status post parotidectomy, left neck level dissection and reconstruction with left cervical facial advancement flap referred by oncology for recurrent malignant right-sided pleural effusion.  Per report patient has had significant declining functional status the past few weeks, patient initially declined Pleurx catheter.  Patient was a direct admit for initiation of heparin drip coming off of Eliquis and to get a Pleurx catheter on Monday per wife and daughter at bedside.    Assessment & Plan:   Principal Problem:   Pleural effusion on right Active Problems:   Pulmonary embolism (HCC)   Coronary artery disease   CVA (cerebral vascular accident) (HCC)   HTN (hypertension)   Salivary gland carcinoma (HCC)   Type 2 diabetes mellitus with hyperglycemia, without long-term current use of insulin (HCC)   Chronic diastolic CHF (congestive heart failure) (HCC)   Obesity (BMI 30-39.9)   COVID-19 virus infection   A-fib (HCC)   Malignant pleural effusion   Pleural effusion  # Malignant pleural effusion Has had several thoracenteses this past month. Stable on 2 liters o2 but hypoxic with minimal ambulation. Last apixaban 11/8 AM - Pulm will plan on placing pleurx catheter on Monday. Will plan on stopping iv heparin morning of the procedure and npo the night before  # Metastatic  high-grade salivary duct carcinoma With malignant pleural effusion. Has established with oncology Alena Bills) outpt. Prognosis appears poor. Onc advises no further inpatient w/u  # AKI Possibly prerenal from decreased po but cr has worsened to 2.28 today despite fluids. Able to urinate but questionable  retention overnight - cont ifv - CT abdomen/pelvis  # Transaminitis Possibly 2/2 dehydration, mets also a possibility particularly given worsening despite fluids - CT abdomen/pelvis  # PE Recent, likely provoked by malignancy - continue IV heparin  # A-fib with rvr Rate controlled initially now in rvr with hr in the 110s to 120s - continue heparin - increase metop to 50 bid assuming pressures hold  # Chronic pain Not in any pain currently - home duloxetine, pregabalin  # T2DM Glucose appropriate - home glipizide on hold - SSI  # CAD Asymptomatic - home statin  # HFpEF Appears compensated, euvolemic. Bnp this morning despite rvr is wnl so do not think in chf - home metop  # Debility - PT consulted  # End-of-life care Family is processing patient's poor prognosis, today we re-visited code status and patient in presence of wife and daughter elects DNR   DVT prophylaxis: iv heparin Code Status: DNR  Family Communication: wife and daughter updated @ bedside 11/10  Level of care: Progressive Status is: inpt    Consultants:  none  Procedures: pending  Antimicrobials:  none    Subjective: Reports feeling fine at rest, has appetite, no pain or dyspnea or chest pain/palpitations  Objective: Vitals:   12/31/22 0256 12/31/22 0500 12/31/22 0505 12/31/22 0742  BP: 107/75  103/82 98/64  Pulse: 73  65 (!) 46  Resp: 18  16 18   Temp: 97.7 F (36.5 C)   97.6 F (36.4 C)  TempSrc: Axillary   Oral  SpO2: 94%  96% 97%  Weight:  113.5 kg    Height:  Intake/Output Summary (Last 24 hours) at 12/31/2022 0813 Last data filed at 12/31/2022 0326 Gross per 24 hour  Intake 1323.1 ml  Output 900 ml  Net 423.1 ml   Filed Weights   12/29/22 2100 12/30/22 0500 12/31/22 0500  Weight: 109.8 kg 108.4 kg 113.5 kg    Examination:  General exam: Appears calm and comfortable  Respiratory system: decreased sounds at bases r>l, rales at bases Cardiovascular  system: S1 & S2 heard, tachycardia irreg  Gastrointestinal system: Abdomen is mildly distended, soft and nontender. No organomegaly or masses felt.   Central nervous system: Alert and oriented. No focal neurological deficits. Extremities: Symmetric 5 x 5 power. Skin: No rashes, lesions or ulcers. pale Psychiatry: Judgement and insight appear normal. Mood & affect appropriate.     Data Reviewed: I have personally reviewed following labs and imaging studies  CBC: Recent Labs  Lab 12/25/22 1522 12/29/22 2127 12/30/22 0520 12/31/22 0435  WBC 8.8 8.3 8.2 9.2  NEUTROABS 7.4 6.9  --   --   HGB 13.9 13.5 14.0 13.9  HCT 42.2 40.6 42.2 42.1  MCV 87.4 85.3 85.6 85.6  PLT 226 272 278 282   Basic Metabolic Panel: Recent Labs  Lab 12/29/22 2127 12/30/22 0520 12/31/22 0435  NA 126* 127* 127*  K 4.6 4.7 5.1  CL 90* 90* 94*  CO2 25 24 21*  GLUCOSE 132* 107* 111*  BUN 55* 57* 69*  CREATININE 1.53* 1.41* 2.28*  CALCIUM 8.9 8.8* 8.6*   GFR: Estimated Creatinine Clearance: 33.6 mL/min (A) (by C-G formula based on SCr of 2.28 mg/dL (H)). Liver Function Tests: Recent Labs  Lab 12/29/22 2127 12/30/22 0520 12/31/22 0435  AST 135* 133* 319*  ALT 71* 70* 90*  ALKPHOS 320* 308* 308*  BILITOT 1.8* 1.7* 2.3*  PROT 6.4* 6.1* 6.4*  ALBUMIN 3.2* 3.2* 3.3*   No results for input(s): "LIPASE", "AMYLASE" in the last 168 hours. No results for input(s): "AMMONIA" in the last 168 hours. Coagulation Profile: Recent Labs  Lab 12/29/22 2133  INR 2.6*   Cardiac Enzymes: No results for input(s): "CKTOTAL", "CKMB", "CKMBINDEX", "TROPONINI" in the last 168 hours. BNP (last 3 results) No results for input(s): "PROBNP" in the last 8760 hours. HbA1C: No results for input(s): "HGBA1C" in the last 72 hours. CBG: Recent Labs  Lab 12/30/22 0826 12/30/22 1117 12/30/22 1653 12/30/22 2123 12/31/22 0739  GLUCAP 102* 110* 91 128* 95   Lipid Profile: No results for input(s): "CHOL", "HDL",  "LDLCALC", "TRIG", "CHOLHDL", "LDLDIRECT" in the last 72 hours. Thyroid Function Tests: No results for input(s): "TSH", "T4TOTAL", "FREET4", "T3FREE", "THYROIDAB" in the last 72 hours. Anemia Panel: No results for input(s): "VITAMINB12", "FOLATE", "FERRITIN", "TIBC", "IRON", "RETICCTPCT" in the last 72 hours. Urine analysis:    Component Value Date/Time   APPEARANCEUR Clear 10/18/2015 1110   GLUCOSEU Negative 10/18/2015 1110   BILIRUBINUR Negative 10/18/2015 1110   PROTEINUR Negative 10/18/2015 1110   NITRITE Negative 10/18/2015 1110   LEUKOCYTESUR Negative 10/18/2015 1110   Sepsis Labs: @LABRCNTIP (procalcitonin:4,lacticidven:4)  ) Recent Results (from the past 240 hour(s))  Body fluid culture w Gram Stain     Status: None   Collection Time: 12/21/22  4:37 PM   Specimen: Pleura  Result Value Ref Range Status   Specimen Description   Final    PLEURAL Performed at [facility], [address] [place], [place] [address]    Special Requests   Final    NONE Performed at [facility], [address] [place], [place] [address]    Gram Stain NO WBC SEEN NO ORGANISMS SEEN   Final   Culture   Final    NO GROWTH 3 DAYS Performed at [facility], [address], [place], [place] [address]    Report Status 12/25/2022 FINAL  Final         Radiology Studies: DG Chest Port 1 View  Result Date: 12/31/2022 CLINICAL DATA:  Tachycardia. EXAM: PORTABLE CHEST 1 VIEW COMPARISON:  12/26/2022 FINDINGS: Interval progression of patchy opacity in the right lung with increasing right pleural effusion. Left lung remains relatively clear. The cardio pericardial silhouette is enlarged. No acute bony abnormality. Telemetry leads overlie the chest. IMPRESSION: Interval progression of patchy opacity in the right lung with increasing right pleural effusion. 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 Electronically Signed   By:  Kennith Center M.D.   On: 12/31/2022 06:39        Scheduled Meds:  balsalazide  1,500 mg Oral BID   DULoxetine  60 mg Oral Daily   fluticasone  1 spray Each Nare Daily   insulin aspart  0-9 Units Subcutaneous TID WC   metoprolol tartrate  50 mg Oral BID   pregabalin  150 mg Oral BID   sodium chloride flush  3 mL Intravenous Q12H   Continuous Infusions:  sodium chloride 100 mL/hr at 12/30/22 2022   heparin 750 Units/hr (12/31/22 0650)     LOS: 2 days   CRITICAL CARE Performed by: Silvano Bilis   Total critical care time: 42 minutes  Critical care time was exclusive of separately billable procedures and treating other patients.  Critical care was necessary to treat or prevent imminent or life-threatening deterioration.  Critical care was time spent personally by me on the following activities: development of treatment plan with patient and/or surrogate as well as nursing, discussions with consultants, evaluation of patient's response to treatment, examination of patient, obtaining history from patient or surrogate, ordering and performing treatments and interventions, ordering and review of laboratory studies, ordering and review of radiographic studies, pulse oximetry and re-evaluation of patient's condition.    Silvano Bilis, MD [facility]   If 7PM-7AM, please contact night-coverage [email or web address] Password [facility] 12/31/2022, 8:13 AM

## 2022-12-31 NOTE — Progress Notes (Signed)
Upon initial assessment of the patient. The patient was oriented to his name and birthday. He was disoriented to place, time and situation. RN reoriented he patient. Patient now remains oriented to person place time and situation.   RN notified Manuela Schwartz, NP of the above information.  Manuela Schwartz, NP assessed the patient at the bedside.   No new orders.

## 2022-12-31 NOTE — Progress Notes (Signed)
HR  sustaining 125-150   RN notified Manuela Schwartz, NP of the above information.   Orders Received: 5mg  IV metoprolol   Intervention: administered 5mg  IV metoprolol.

## 2022-12-31 NOTE — Consult Note (Signed)
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   After he was discharged earlier this month, he has not been having anything PO, significant weakness and failure to thrive. He was also found to have COVID-19 pneumonia. He went to visit his oncologist on 11/08/204 and was found to be significantly weak and therefore was referred to the ED. His CXR showed recurrent of his right pleural effusion.   On encounter, he appeared fatigued, wanting to sleep and not very interactive. His respiratory status was acceptable.   Physical exam:  GEN: Lethargic fatigued but wakes up to voice HEENT Supple neck, reactive pupils  CVS Normal S1, Normal S2, RRR  Pulmonary: Diminished breath sounds over the right hemithorax  Abdomen: Soft, non tender, non distended Extremities Warm and well perfused.   Labs and imaging were reviewd.   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   Ongoing discussions regarding plan of care and moving towards hospice with family.  For now plan to proceed with palliative Pleurx placement tomorrw.   []  Keep NPO after midnight  []  Please hold Heparin drip at 4am.  []  Plan for PleurX placement tomorrow at 10:30 am.   I spent 30 minutes caring for this patient today, including preparing to see the patient, obtaining a medical history , reviewing a separately obtained history, performing a medically appropriate examination and/or evaluation, counseling and educating the  patient/family/caregiver, documenting clinical information in the electronic health record, and independently interpreting results (not separately reported/billed) and communicating results to the patient/family/caregiver  [name], MD [facility] 12/31/2022 1:54 PM

## 2022-12-31 NOTE — Progress Notes (Signed)
Patient to transfer to higher level of care per MD. Order placed by MD and bed request placed.

## 2022-12-31 NOTE — Progress Notes (Signed)
PHARMACY - ANTICOAGULATION CONSULT NOTE  Pharmacy Consult for Heparin Infusion Indication: pulmonary embolus  Allergies  Allergen Reactions   Ciprofloxacin Nausea Only    Patient Measurements: Height: 6' (182.9 cm) Weight: 113.5 kg (250 lb 3.6 oz) IBW/kg (Calculated) : 77.6 Heparin Dosing Weight: 100.9 kg  Vital Signs: Temp: 97.8 F (36.6 C) (11/10 1127) Temp Source: Oral (11/10 1127) BP: 87/68 (11/10 1526) Pulse Rate: 64 (11/10 1526)  Labs: Recent Labs    12/29/22 2127 12/29/22 2133 12/30/22 0520 12/30/22 1644 12/30/22 1758 12/31/22 0435 12/31/22 1504  HGB 13.5  --  14.0  --   --  13.9  --   HCT 40.6  --  42.2  --   --  42.1  --   PLT 272  --  278  --   --  282  --   APTT 52*  --  >200*   < > >200* >200* 105*  LABPROT  --  27.7*  --   --   --   --   --   INR  --  2.6*  --   --   --   --   --   HEPARINUNFRC  --  >1.10*  --   --   --  >1.10*  --   CREATININE 1.53*  --  1.41*  --   --  2.28*  --    < > = values in this interval not displayed.    Estimated Creatinine Clearance: 33.6 mL/min (A) (by C-G formula based on SCr of 2.28 mg/dL (H)).   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 He presented to [facility] with recurrent right pleural effusion and was noted to have a pulmonary embolism. Pharmacy was consulted to initiate patient on a heparin infusion.  Of note, patient does take apixaban at home, with his last dose being 11/8 AM.  Baseline heparin level, INR, and aPTT ordered.  No  signs/symptoms of bleeding noted in chart. Baseline CBC ordered.  11/9 0520  aPTT>200, SUPRAtherapeutic  1600>1400 11/10 0453 aPTT > 200,  HL > 1.10  11/10 1503 aPTT 105, SUPRAtherapeutic  Goal of Therapy:  Heparin level 0.3-0.7 units/ml aPTT 66-102 seconds Monitor platelets by anticoagulation protocol: Yes   Plan:  11/10 1503 aPTT 105, SUPRAtherapeutic - will decrease heparin rate to 650 units/hour - will recheck aPTT 8 hrs after rate change - heparin infusion will be stopped at 11/11 0400 for palliative Pleurx placement; follow for restart post-procedure - Monitor CBC and heparin level with AM labs    Merryl Hacker, PharmD Clinical Pharmacist  12/31/2022 3:29 PM

## 2023-01-01 ENCOUNTER — Other Ambulatory Visit (HOSPITAL_COMMUNITY): Payer: Self-pay

## 2023-01-01 ENCOUNTER — Encounter: Payer: Self-pay | Admitting: General Practice

## 2023-01-01 ENCOUNTER — Inpatient Hospital Stay (HOSPITAL_COMMUNITY)
Admission: AD | Admit: 2023-01-01 | Discharge: 2023-01-01 | Disposition: A | Discharge status: Home / Self Care | Payer: 59 | Source: Other Acute Inpatient Hospital | Attending: Obstetrics and Gynecology | Admitting: Obstetrics and Gynecology

## 2023-01-01 ENCOUNTER — Other Ambulatory Visit: Payer: Medicare Other

## 2023-01-01 ENCOUNTER — Encounter
Admission: AD | Disposition: A | Discharge status: Hospice | Payer: Self-pay | Source: Ambulatory Visit | Attending: Obstetrics and Gynecology

## 2023-01-01 ENCOUNTER — Inpatient Hospital Stay: Payer: 59

## 2023-01-01 DIAGNOSIS — N179 Acute kidney failure, unspecified: Secondary | ICD-10-CM | POA: Diagnosis not present

## 2023-01-01 DIAGNOSIS — R7401 Elevation of levels of liver transaminase levels: Secondary | ICD-10-CM

## 2023-01-01 DIAGNOSIS — J910 Malignant pleural effusion: Secondary | ICD-10-CM

## 2023-01-01 DIAGNOSIS — C089 Malignant neoplasm of major salivary gland, unspecified: Secondary | ICD-10-CM

## 2023-01-01 DIAGNOSIS — J90 Pleural effusion, not elsewhere classified: Secondary | ICD-10-CM | POA: Diagnosis not present

## 2023-01-01 DIAGNOSIS — Z7189 Other specified counseling: Secondary | ICD-10-CM

## 2023-01-01 DIAGNOSIS — I3139 Other pericardial effusion (noninflammatory): Secondary | ICD-10-CM | POA: Diagnosis not present

## 2023-01-01 HISTORY — PX: CHEST TUBE INSERTION: SHX231

## 2023-01-01 LAB — COMPREHENSIVE METABOLIC PANEL
ALT: 122 U/L — ABNORMAL HIGH (ref 0–44)
AST: 608 U/L — ABNORMAL HIGH (ref 15–41)
Albumin: 3.1 g/dL — ABNORMAL LOW (ref 3.5–5.0)
Alkaline Phosphatase: 254 U/L — ABNORMAL HIGH (ref 38–126)
Anion gap: 18 — ABNORMAL HIGH (ref 5–15)
BUN: 85 mg/dL — ABNORMAL HIGH (ref 8–23)
CO2: 16 mmol/L — ABNORMAL LOW (ref 22–32)
Calcium: 8.4 mg/dL — ABNORMAL LOW (ref 8.9–10.3)
Chloride: 95 mmol/L — ABNORMAL LOW (ref 98–111)
Creatinine, Ser: 3.41 mg/dL — ABNORMAL HIGH (ref 0.61–1.24)
GFR, Estimated: 17 mL/min — ABNORMAL LOW (ref >60)
Glucose, Bld: 85 mg/dL (ref 70–99)
Potassium: 5.3 mmol/L — ABNORMAL HIGH (ref 3.5–5.1)
Sodium: 129 mmol/L — ABNORMAL LOW (ref 135–145)
Total Bilirubin: 2.9 mg/dL — ABNORMAL HIGH (ref <1.2)
Total Protein: 5.6 g/dL — ABNORMAL LOW (ref 6.5–8.1)

## 2023-01-01 LAB — GLUCOSE, CAPILLARY
Glucose-Capillary: 73 mg/dL (ref 70–99)
Glucose-Capillary: 100 mg/dL — ABNORMAL HIGH (ref 70–99)
Glucose-Capillary: 101 mg/dL — ABNORMAL HIGH (ref 70–99)

## 2023-01-01 LAB — CBC
HCT: 42.1 % (ref 39.0–52.0)
Hemoglobin: 14 g/dL (ref 13.0–17.0)
MCH: 28.6 pg (ref 26.0–34.0)
MCHC: 33.3 g/dL (ref 30.0–36.0)
MCV: 85.9 fL (ref 80.0–100.0)
Platelets: 280 10*3/uL (ref 150–400)
RBC: 4.9 MIL/uL (ref 4.22–5.81)
RDW: 16 % — ABNORMAL HIGH (ref 11.5–15.5)
WBC: 10.4 10*3/uL (ref 4.0–10.5)
nRBC: 0.2 % (ref 0.0–0.2)

## 2023-01-01 LAB — ECHOCARDIOGRAM COMPLETE
AR max vel: 1.84 cm2
AV Area VTI: 2.51 cm2
AV Area mean vel: 2.04 cm2
AV Mean grad: 1.5 mm[Hg]
AV Peak grad: 2.9 mm[Hg]
Ao pk vel: 0.86 m/s
Area-P 1/2: 3.68 cm2
Height: 72 in
S' Lateral: 2.6 cm
Weight: 4003.55 [oz_av]

## 2023-01-01 LAB — APTT: aPTT: 92 s — ABNORMAL HIGH (ref 24–36)

## 2023-01-01 SURGERY — INSERTION, PLEURAL DRAINAGE CATHETER
Anesthesia: LOCAL | Laterality: Right

## 2023-01-01 MED ORDER — HALOPERIDOL LACTATE 5 MG/ML IJ SOLN: 2.5000 mg | INTRAMUSCULAR | Status: DC | PRN

## 2023-01-01 MED ORDER — PATIROMER SORBITEX CALCIUM 8.4 G PO PACK
8.4000 g | PACK | Freq: Every day | ORAL | Status: DC
  Filled 2023-01-01: qty 1

## 2023-01-01 MED ORDER — ONDANSETRON HCL 4 MG/2ML IJ SOLN: 4.0000 mg | Freq: Four times a day (QID) | INTRAMUSCULAR | Status: DC | PRN

## 2023-01-01 MED ORDER — ACETAMINOPHEN 650 MG RE SUPP: 650.0000 mg | Freq: Four times a day (QID) | RECTAL | Status: DC | PRN

## 2023-01-01 MED ORDER — ACETAMINOPHEN 325 MG PO TABS: 650.0000 mg | ORAL_TABLET | Freq: Four times a day (QID) | ORAL | Status: DC | PRN

## 2023-01-01 MED ORDER — GLYCOPYRROLATE 0.2 MG/ML IJ SOLN: 0.2000 mg | INTRAMUSCULAR | Status: DC | PRN

## 2023-01-01 MED ORDER — POLYVINYL ALCOHOL 1.4 % OP SOLN: 1.0000 [drp] | Freq: Four times a day (QID) | OPHTHALMIC | Status: DC | PRN

## 2023-01-01 MED ORDER — MORPHINE SULFATE (PF) 2 MG/ML IV SOLN
2.0000 mg | INTRAVENOUS | Status: DC | PRN
Start: 2023-01-01 — End: 2023-01-02
  Administered 2023-01-01 – 2023-01-02 (×2): 2 mg via INTRAVENOUS
  Filled 2023-01-01 (×2): qty 1

## 2023-01-01 MED ORDER — SODIUM CHLORIDE 0.9% FLUSH
10.0000 mL | Freq: Two times a day (BID) | INTRAVENOUS | Status: DC
  Administered 2023-01-01 – 2023-01-02 (×2): 10 mL via INTRAVENOUS

## 2023-01-01 MED ORDER — SODIUM BICARBONATE 8.4 % IV SOLN
INTRAVENOUS | Status: DC
  Filled 2023-01-01: qty 150

## 2023-01-01 MED ORDER — PREGABALIN 75 MG PO CAPS: 75.0000 mg | ORAL_CAPSULE | Freq: Two times a day (BID) | ORAL | Status: DC

## 2023-01-01 MED ORDER — MIDAZOLAM HCL 2 MG/2ML IJ SOLN: 2.0000 mg | INTRAMUSCULAR | Status: DC | PRN

## 2023-01-01 MED ORDER — ONDANSETRON 4 MG PO TBDP: 4.0000 mg | ORAL_TABLET | Freq: Four times a day (QID) | ORAL | Status: DC | PRN

## 2023-01-01 MED ORDER — GLYCOPYRROLATE 1 MG PO TABS: 1.0000 mg | ORAL_TABLET | ORAL | Status: DC | PRN

## 2023-01-01 NOTE — Progress Notes (Signed)
PROGRESS NOTE    Erik Hendricks  ZOX:[id number] DOB: Sep 07, 1940 DOA: 12/29/2022 PCP: [name], MD  Outpatient Specialists: oncology    Brief Narrative:   TYLIL Hendricks is a 82 y.o. male with medical history significant for pulmonary embolism on Eliquis, recurrent metastatic high-grade salivary duct cancer, status post parotidectomy, left neck level dissection and reconstruction with left cervical facial advancement flap referred by oncology for recurrent malignant right-sided pleural effusion.  Per report patient has had significant declining functional status the past few weeks, patient initially declined Pleurx catheter.  Patient was a direct admit for initiation of heparin drip coming off of Eliquis and to get a Pleurx catheter on Monday per wife and daughter at bedside.    Assessment & Plan:   Principal Problem:   Pleural effusion on right Active Problems:   Pulmonary embolism (HCC)   Coronary artery disease   CVA (cerebral vascular accident) (HCC)   HTN (hypertension)   Salivary gland carcinoma (HCC)   Type 2 diabetes mellitus with hyperglycemia, without long-term current use of insulin (HCC)   Chronic diastolic CHF (congestive heart failure) (HCC)   Obesity (BMI 30-39.9)   COVID-19 virus infection   A-fib (HCC)   Malignant pleural effusion   Pleural effusion   AKI (acute kidney injury) (HCC)   Transaminitis  # Malignant pleural effusion Has had several thoracenteses this past month. Stable on 2 liters o2 but hypoxic with minimal ambulation. Pleurx catheter placed today, later today family decided to make full comfort care - drain catheter prn for comfort  # Metastatic  high-grade salivary duct carcinoma With malignant pleural effusion. Has established with oncology Alena Bills) outpt. Prognosis is poor particularly in light of significant functional decline and now multi-organ failure, after meeting with oncology today family has elected to pursue full comfort  measures, anticipate in-hospital death given multi-organ failure and lack of PO. - comfort care measures ordered - hospice consulted  # AKI # Metabolic acidosis Possibly prerenal from decreased po but cr has worsened to 3s despite resuscitation, no signs obstruction on CT. Nephrology consulted today - deferring further w/u given transition to comfort care   # Transaminitis Worsening despite fluids, etiology unclear - deferring further w/u given transition to comfort care  # PE Recent, likely provoked by malignancy - d/c heparin  # A-fib with rvr Yesterday, rate controlled with up-titration of BB  # Pericardial effusion Large, likely 2/2 malignancy, no tamponade physiology  # Chronic pain Not in any pain currently  # T2DM  # CAD  # HFpEF   DVT prophylaxis: n/a Code Status: DNR  Family Communication: wife and daughter updated @ bedside 11/11  Level of care: Progressive Status is: inpt    Consultants:  Nephrology, pulm  Procedures: Pleurx catheter placement 11/11  Antimicrobials:  none    Subjective: asleep  Objective: Vitals:   01/01/23 1039 01/01/23 1045 01/01/23 1050 01/01/23 1212  BP: (!) 89/71 101/68 100/77 125/73  Pulse: 98 100  75  Resp: 14 14 14    Temp:    (!) 97.5 F (36.4 C)  TempSrc:    Rectal  SpO2: 95% 93% 96% 93%  Weight:      Height:        Intake/Output Summary (Last 24 hours) at 01/01/2023 1550 Last data filed at 01/01/2023 1115 Gross per 24 hour  Intake 1310 ml  Output 1000 ml  Net 310 ml   Filed Weights   12/29/22 2100 12/30/22 0500 12/31/22 0500  Weight: 109.8 kg  108.4 kg 113.5 kg    Examination:  General exam: Appears calm and comfortable  Respiratory system: decreased sounds at bases r>l, rales at bases Cardiovascular system: S1 & S2 heard, irreg Gastrointestinal system: Abdomen is mildly distended, soft and nontender.  Central nervous system: Asleep Extremities: warm Skin: No rashes, lesions or ulcers.  pale Psychiatry: calm    Data Reviewed: I have personally reviewed following labs and imaging studies  CBC: Recent Labs  Lab 12/29/22 2127 12/30/22 0520 12/31/22 0435 01/01/23 0644  WBC 8.3 8.2 9.2 10.4  NEUTROABS 6.9  --   --   --   HGB 13.5 14.0 13.9 14.0  HCT 40.6 42.2 42.1 42.1  MCV 85.3 85.6 85.6 85.9  PLT 272 278 282 280   Basic Metabolic Panel: Recent Labs  Lab 12/29/22 2127 12/30/22 0520 12/31/22 0435 01/01/23 0644  NA 126* 127* 127* 129*  K 4.6 4.7 5.1 5.3*  CL 90* 90* 94* 95*  CO2 25 24 21* 16*  GLUCOSE 132* 107* 111* 85  BUN 55* 57* 69* 85*  CREATININE 1.53* 1.41* 2.28* 3.41*  CALCIUM 8.9 8.8* 8.6* 8.4*   GFR: Estimated Creatinine Clearance: 22.5 mL/min (A) (by C-G formula based on SCr of 3.41 mg/dL (H)). Liver Function Tests: Recent Labs  Lab 12/29/22 2127 12/30/22 0520 12/31/22 0435 01/01/23 0644  AST 135* 133* 319* 608*  ALT 71* 70* 90* 122*  ALKPHOS 320* 308* 308* 254*  BILITOT 1.8* 1.7* 2.3* 2.9*  PROT 6.4* 6.1* 6.4* 5.6*  ALBUMIN 3.2* 3.2* 3.3* 3.1*   No results for input(s): "LIPASE", "AMYLASE" in the last 168 hours. No results for input(s): "AMMONIA" in the last 168 hours. Coagulation Profile: Recent Labs  Lab 12/29/22 2133  INR 2.6*   Cardiac Enzymes: No results for input(s): "CKTOTAL", "CKMB", "CKMBINDEX", "TROPONINI" in the last 168 hours. BNP (last 3 results) No results for input(s): "PROBNP" in the last 8760 hours. HbA1C: No results for input(s): "HGBA1C" in the last 72 hours. CBG: Recent Labs  Lab 12/31/22 1640 12/31/22 2135 01/01/23 0839 01/01/23 1005 01/01/23 1204  GLUCAP 94 104* 73 100* 101*   Lipid Profile: No results for input(s): "CHOL", "HDL", "LDLCALC", "TRIG", "CHOLHDL", "LDLDIRECT" in the last 72 hours. Thyroid Function Tests: No results for input(s): "TSH", "T4TOTAL", "FREET4", "T3FREE", "THYROIDAB" in the last 72 hours. Anemia Panel: No results for input(s): "VITAMINB12", "FOLATE", "FERRITIN",  "TIBC", "IRON", "RETICCTPCT" in the last 72 hours. Urine analysis:    Component Value Date/Time   APPEARANCEUR Clear 10/18/2015 1110   GLUCOSEU Negative 10/18/2015 1110   BILIRUBINUR Negative 10/18/2015 1110   PROTEINUR Negative 10/18/2015 1110   NITRITE Negative 10/18/2015 1110   LEUKOCYTESUR Negative 10/18/2015 1110   Sepsis Labs: @LABRCNTIP (procalcitonin:4,lacticidven:4)  ) No results found for this or any previous visit (from the past 240 hour(s)).        Radiology Studies: ECHOCARDIOGRAM COMPLETE  Result Date: 01/01/2023    ECHOCARDIOGRAM REPORT   Patient Name:   JERRY KRIENKE Date of Exam: 01/01/2023 Medical Rec #:  [id number]       Height:       72.0 in Accession #:    [id number]      Weight:       250.2 lb Date of Birth:  17-Aug-1940       BSA:          2.343 m Patient Age:    82 years        BP:  99/80 mmHg Patient Gender: M               HR:           83 bpm. Exam Location:  [facility] Procedure: 2D Echo, Cardiac Doppler and Color Doppler STAT ECHO Indications:     Pericardial Effusion I31.3  History:         Patient has no prior history of Echocardiogram examinations.                  Arrythmias:Atrial Fibrillation, Signs/Symptoms:Murmur; Risk                  Factors:Hypertension.  Sonographer:     Cristela Blue Referring Phys:  [id number] [facility] Oniya Mandarino Diagnosing Phys: Rozell Searing Custovic IMPRESSIONS  1. Left ventricular ejection fraction, by estimation, is 55 to 60%. Left ventricular ejection fraction by PLAX is 58 %. The left ventricle has normal function. The left ventricle has no regional wall motion abnormalities. Left ventricular diastolic parameters are consistent with Grade II diastolic dysfunction (pseudonormalization).  2. Right ventricular systolic function is normal. The right ventricular size is moderately enlarged. There is normal pulmonary artery systolic pressure. The estimated right ventricular systolic pressure is 25.5 mmHg.  3. Large pericardial effusion. The  pericardial effusion is circumferential. There is no evidence of cardiac tamponade.  4. The mitral valve is grossly normal. Trivial mitral valve regurgitation.  5. The aortic valve is calcified. Aortic valve regurgitation is not visualized. Aortic valve sclerosis/calcification is present, without any evidence of aortic stenosis.  6. Aortic dilatation noted. FINDINGS  Left Ventricle: Left ventricular ejection fraction, by estimation, is 55 to 60%. Left ventricular ejection fraction by PLAX is 58 %. The left ventricle has normal function. The left ventricle has no regional wall motion abnormalities. The left ventricular internal cavity size was normal in size. There is no left ventricular hypertrophy. Left ventricular diastolic parameters are consistent with Grade II diastolic dysfunction (pseudonormalization). Right Ventricle: The right ventricular size is moderately enlarged. No increase in right ventricular wall thickness. Right ventricular systolic function is normal. There is normal pulmonary artery systolic pressure. The tricuspid regurgitant velocity is 1.62 m/s, and with an assumed right atrial pressure of 15 mmHg, the estimated right ventricular systolic pressure is 25.5 mmHg. Left Atrium: Left atrial size was normal in size. Right Atrium: Right atrial size was normal in size. Pericardium: A large pericardial effusion is present. The pericardial effusion is circumferential. There is diastolic collapse of the right atrial wall and diastolic collapse of the right ventricular free wall. There is no evidence of cardiac tamponade. Mitral Valve: The mitral valve is grossly normal. Trivial mitral valve regurgitation. Tricuspid Valve: The tricuspid valve is grossly normal. Tricuspid valve regurgitation is trivial. Aortic Valve: The aortic valve is calcified. Aortic valve regurgitation is not visualized. Aortic valve sclerosis/calcification is present, without any evidence of aortic stenosis. Aortic valve mean  gradient measures 1.5 mmHg. Aortic valve peak gradient measures 2.9 mmHg. Aortic valve area, by VTI measures 2.51 cm. Pulmonic Valve: The pulmonic valve was grossly normal. Pulmonic valve regurgitation is not visualized. Aorta: Aortic dilatation noted. IAS/Shunts: No atrial level shunt detected by color flow Doppler.  LEFT VENTRICLE PLAX 2D LV EF:         Left            Diastology                ventricular     LV e' medial:    4.79  cm/s                ejection        LV E/e' medial:  14.4                fraction by     LV e' lateral:   4.57 cm/s                PLAX is 58      LV E/e' lateral: 15.1                %. LVIDd:         3.70 cm LVIDs:         2.60 cm LV PW:         0.90 cm LV IVS:        1.00 cm LVOT diam:     2.00 cm LV SV:         28 LV SV Index:   12 LVOT Area:     3.14 cm  RIGHT VENTRICLE RV Basal diam:  4.00 cm RV Mid diam:    2.70 cm LEFT ATRIUM             Index        RIGHT ATRIUM           Index LA diam:        4.40 cm 1.88 cm/m   RA Area:     12.70 cm LA Vol (A2C):   48.3 ml 20.61 ml/m  RA Volume:   33.20 ml  14.17 ml/m LA Vol (A4C):   44.7 ml 19.07 ml/m LA Biplane Vol: 47.7 ml 20.36 ml/m  AORTIC VALVE AV Area (Vmax):    1.84 cm AV Area (Vmean):   2.04 cm AV Area (VTI):     2.51 cm AV Vmax:           85.65 cm/s AV Vmean:          56.950 cm/s AV VTI:            0.110 m AV Peak Grad:      2.9 mmHg AV Mean Grad:      1.5 mmHg LVOT Vmax:         50.30 cm/s LVOT Vmean:        37.000 cm/s LVOT VTI:          0.088 m LVOT/AV VTI ratio: 0.80  AORTA Ao Root diam: 4.07 cm MITRAL VALVE               TRICUSPID VALVE MV Area (PHT): 3.68 cm    TR Peak grad:   10.5 mmHg MV Decel Time: 206 msec    TR Vmax:        162.00 cm/s MV E velocity: 69.20 cm/s                            SHUNTS                            Systemic VTI:  0.09 m                            Systemic Diam: 2.00 cm Rozell Searing Custovic Electronically signed by Clotilde Dieter Signature Date/Time: 01/01/2023/12:44:45 PM    Final    CT  ABDOMEN PELVIS WO CONTRAST  Result Date: 12/31/2022  CLINICAL DATA:  Metastatic salivary gland tumor. Acute kidney injury. Transaminitis. * Tracking Code: BO * EXAM: CT ABDOMEN AND PELVIS WITHOUT CONTRAST TECHNIQUE: Multidetector CT imaging of the abdomen and pelvis was performed following the standard protocol without IV contrast. RADIATION DOSE REDUCTION: This exam was performed according to the departmental dose-optimization program which includes automated exposure control, adjustment of the mA and/or kV according to patient size and/or use of iterative reconstruction technique. COMPARISON:  Abdomen and pelvis CT 07/25/2011 FINDINGS: Lower chest: Moderate to large right pleural effusion incompletely visualized. There is collapse/consolidation of the right lower lobe. Moderate pericardial effusion measuring up to 2.2 cm in thickness. Density of the pericardial fluid is too high to be simple fluid and may be proteinaceous or hemorrhagic in content. Hepatobiliary: No suspicious focal abnormality in the liver on this study without intravenous contrast. There is no evidence for gallstones, gallbladder wall thickening, or pericholecystic fluid. Increased attenuation of gallbladder fluid may be due to sludge. No intrahepatic or extrahepatic biliary dilation. Pancreas: No focal mass lesion. No dilatation of the main duct. No intraparenchymal cyst. No peripancreatic edema. Spleen: No splenomegaly. No suspicious focal mass lesion. Adrenals/Urinary Tract: No adrenal nodule or mass. Small cyst noted lower pole right kidney. Exophytic 3.2 cm cyst noted lower pole left kidney. No followup imaging is recommended. No hydronephrosis or hydroureter. The urinary bladder appears normal for the degree of distention. Stomach/Bowel: Stomach is distended with gas and fluid. Duodenum is normally positioned as is the ligament of Treitz. Duodenal diverticulum noted. No small bowel wall thickening. No small bowel dilatation. The terminal  ileum is normal. The appendix is not well visualized, but there is no edema or inflammation in the region of the cecal tip to suggest appendicitis. No gross colonic mass. No colonic wall thickening. Diverticuli are seen scattered along the entire length of the colon without CT findings of diverticulitis. Fluid is seen in the rectum, a finding that can be associated with clinical diarrhea. Vascular/Lymphatic: There is moderate atherosclerotic calcification of the abdominal aorta without aneurysm. There is no gastrohepatic or hepatoduodenal ligament lymphadenopathy. No retroperitoneal or mesenteric lymphadenopathy. No pelvic sidewall lymphadenopathy. Reproductive: Prostate gland is enlarged. Other: Left groin hernia contains fat and a benign appearing calcification. Edema or hemorrhage is seen in the extraperitoneal left pelvic sidewall. Given the absence of a trauma history, urine leak from the left ureter is considered unlikely. There is a small volume of fluid adjacent to the liver and in both pericolic gutters. Musculoskeletal: Diffuse body wall edema evident. No worrisome lytic or sclerotic osseous abnormality. IMPRESSION: 1. Moderate to large right pleural effusion incompletely visualized. There is collapse/consolidation of the right lower lobe. 2. Moderate pericardial effusion measuring up to 2.2 cm in thickness. Density of the pericardial fluid is too high to be simple fluid compatible with proteinaceous or hemorrhagic pericardial fluid. 3. Edema or hemorrhage in the extraperitoneal left pelvic sidewall. This is of indeterminate etiology. Trace retroperitoneal hemorrhage could have this appearance especially if the patient has had recent left groin vascular access procedure. Given the absence of a trauma history, urine leak from the left ureter is considered unlikely. 4. Small volume of fluid adjacent to the liver and in both para colic gutters. 5. 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 6.  Fluid in the rectum, a finding that can be associated with clinical diarrhea. 7. Left groin hernia contains fat and a benign appearing calcification. 8. Diffuse body wall edema. 9.  Aortic Atherosclerosis (ICD10-I70.0). These results will be called to  the ordering clinician or representative by the Radiologist Assistant, and communication documented in the PACS or [employer]. Electronically Signed   By: Kennith Center M.D.   On: 12/31/2022 13:55   DG Chest Port 1 View  Result Date: 12/31/2022 CLINICAL DATA:  Tachycardia. EXAM: PORTABLE CHEST 1 VIEW COMPARISON:  12/26/2022 FINDINGS: Interval progression of patchy opacity in the right lung with increasing right pleural effusion. Left lung remains relatively clear. The cardio pericardial silhouette is enlarged. No acute bony abnormality. Telemetry leads overlie the chest. IMPRESSION: Interval progression of patchy opacity in the right lung with increasing right pleural effusion. 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 Electronically Signed   By: Kennith Center M.D.   On: 12/31/2022 06:39        Scheduled Meds:  sodium chloride flush  10 mL Intravenous Q12H   Continuous Infusions:     LOS: 2 days   CRITICAL CARE Performed by: Vida Rigger, MD [facility]   If 7PM-7AM, please contact night-coverage [email or web address] Password [email or web address] 01/01/2023, 3:50 PM

## 2023-01-01 NOTE — Progress Notes (Signed)
[facility] Liaison Note  New referral received from Darolyn Rua, SW/TOC and Dr. Ashok Pall asking liaison team to meet with the family to introduce hospice services.  Team will meet with patient/family tomorrow.  Dr. Ashok Pall and Morrie Sheldon in agreement.  Thank you for allowing participation in this patient's care.  Norris Cross RN Nurse Liaison [phone]

## 2023-01-01 NOTE — Interval H&P Note (Signed)
Patient here for IPC placement. Heparin Held at 4:00 am. Appropriate for the procedure.

## 2023-01-01 NOTE — Progress Notes (Signed)
Returned to patient room via bed with O2 via 3L [place].  Post-procedure report given by K. Register to floor staff.

## 2023-01-01 NOTE — Consult Note (Signed)
[facility] CLINIC CARDIOLOGY CONSULT NOTE       Patient ID: Erik Hendricks MRN: [id number] DOB/AGE: 80-Jun-1942 80 y.o.  Admit date: 12/29/2022 Referring Physician Dr. Shonna Chock Primary Physician [name], MD  Primary Cardiologist Dr. Darrold Junker Reason for Consultation pericardial effusion  HPI: Erik Hendricks is a 82 y.o. 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 Cardiology was consulted for further evaluation.   Portions of the history obtained via chart review and provided by patient's wife and daughter, patient is somnolent throughout exam.  Patient's family reports that for the last few weeks he has had progressively worsening shortness of breath.  Also reports associated fatigue.  States that earlier last week he was in the hospital for pleural effusions which were drained and he was sent home on supplemental oxygen.  This was new for him.  Found to have recurrence of pleural effusions a few days later and was directly admitted to the hospital for Pleurx catheter placement.  Patient has history of salivary gland cancer now with metastasis to his lungs.  Workup upon admission notable for creatinine 1.53, potassium 4.6, sodium 126, elevated LFTs, hemoglobin 13.5, WBC 8.3.  At the time of my evaluation this morning the patient is resting in hospital bed at a slight incline.  He is somnolent on exam but arouses to voice and falls back asleep.  He is without complaint.  His wife and daughter state that he has not complained of any chest pain or any other pain recently.  Main issue has been the progressively worsening shortness of breath and the recurrent pleural effusions.  Review of systems complete and found to be negative unless listed above    Past Medical History:  Diagnosis Date   A-fib [facility])    Anal  fissure    Colon polyp    Hemorrhoid    Hypertension    Murmur    Rectal abscess     Past Surgical History:  Procedure Laterality Date   ANAL FISTULECTOMY  September 2013   Posterior subcutaneous fistula treated by fistulotomy.   COLONOSCOPY  2007, 2012   Dr Lemar Livings   COLONOSCOPY WITH PROPOFOL N/A 06/21/2018   Procedure: COLONOSCOPY WITH PROPOFOL;  Surgeon: Christena Deem, MD;  Location: [facility] ENDOSCOPY;  Service: Endoscopy;  Laterality: N/A;   FISSURECTOMY     40 yrs ago   KNEE SURGERY Left 2005   POLYPECTOMY  2007   RECTAL SURGERY  40 yrs ago   cyst    Medications Prior to Admission  Medication Sig Dispense Refill Last Dose   Alpha-Lipoic Acid 600 MG CAPS Take by mouth.      apixaban (ELIQUIS) 5 MG TABS tablet Take 1 tablet (5 mg total) by mouth 2 (two) times daily.      balsalazide (COLAZAL) 750 MG capsule Take 1,500 mg by mouth 2 (two) times daily.      Cholecalciferol (VITAMIN D-1000 MAX ST) 25 MCG (1000 UT) tablet Take by mouth.      DULoxetine (CYMBALTA) 60 MG capsule Take 60 mg by mouth daily.      feeding supplement (ENSURE ENLIVE / ENSURE PLUS) LIQD Take 237 mLs by mouth 3 (three) times daily between meals. 21330 mL 0    fluticasone (FLONASE) 50 MCG/ACT nasal spray Place 1 spray into both nostrils daily.      glipiZIDE (GLUCOTROL XL)  5 MG 24 hr tablet Take 1 tablet by mouth daily.      metoprolol tartrate (LOPRESSOR) 50 MG tablet Take 50 mg by mouth 2 (two) times daily.      niacin (NIASPAN) 500 MG CR tablet Take 500 mg by mouth daily.       pregabalin (LYRICA) 150 MG capsule Take 150 mg by mouth 2 (two) times daily.      simvastatin (ZOCOR) 20 MG tablet Take 20 mg by mouth daily.       Social History   Socioeconomic History   Marital status: Married    Spouse name: Not on file   Number of children: Not on file   Years of education: Not on file   Highest education level: Not on file  Occupational History   Not on file  Tobacco Use   Smoking status: Former     Types: Cigars   Smokeless tobacco: Never   Tobacco comments:    Quit over 50 years ago. Smoked 2-3 a week.  Substance and Sexual Activity   Alcohol use: No   Drug use: No   Sexual activity: Not on file  Other Topics Concern   Not on file  Social History Narrative   Not on file   Social Determinants of Health   Financial Resource Strain: Patient Declined (08/08/2022)   Received from [facility], [facility]   Overall Financial Resource Strain (CARDIA)    Difficulty of Paying Living Expenses: Patient declined  Food Insecurity: No Food Insecurity (12/29/2022)   Hunger Vital Sign    Worried About Running Out of Food in the Last Year: Never true    Ran Out of Food in the Last Year: Never true  Transportation Needs: No Transportation Needs (12/29/2022)   PRAPARE - Administrator, Civil Service (Medical): No    Lack of Transportation (Non-Medical): No  Physical Activity: Not on file  Stress: Not on file  Social Connections: Not on file  Intimate Partner Violence: Not At Risk (12/29/2022)   Humiliation, Afraid, Rape, and Kick questionnaire    Fear of Current or Ex-Partner: No    Emotionally Abused: No    Physically Abused: No    Sexually Abused: No    Family History  Problem Relation Age of Onset   Heart Problems Father    Prostate cancer Neg Hx    Bladder Cancer Neg Hx      Vitals:   01/01/23 1039 01/01/23 1045 01/01/23 1050 01/01/23 1212  BP: (!) 89/71 101/68 100/77 125/73  Pulse: 98 100  75  Resp: 14 14 14    Temp:    (!) 97.5 F (36.4 C)  TempSrc:    Rectal  SpO2: 95% 93% 96% 93%  Weight:      Height:        PHYSICAL EXAM General: Ill appearing, well nourished, in no acute distress laying at a slight incline in hospital bed. HEENT: Normocephalic and atraumatic. Neck: No JVD.  Lungs: Normal respiratory effort on 2L [place] mild.  Diminished breath sounds bilaterally Heart: Irregularly irregular. Normal S1 and S2 without  gallops or murmurs.  Abdomen: Non-distended appearing.  Msk: Normal strength and tone for age. Extremities: Warm and well perfused. No clubbing, cyanosis.  Trace edema.  Neuro: Alert and oriented X 3. Psych: Answers questions appropriately.   Labs: Basic Metabolic Panel: Recent Labs    12/31/22 0435 01/01/23 0644  NA 127* 129*  K 5.1 5.3*  CL  94* 95*  CO2 21* 16*  GLUCOSE 111* 85  BUN 69* 85*  CREATININE 2.28* 3.41*  CALCIUM 8.6* 8.4*   Liver Function Tests: Recent Labs    12/31/22 0435 01/01/23 0644  AST 319* 608*  ALT 90* 122*  ALKPHOS 308* 254*  BILITOT 2.3* 2.9*  PROT 6.4* 5.6*  ALBUMIN 3.3* 3.1*   No results for input(s): "LIPASE", "AMYLASE" in the last 72 hours. CBC: Recent Labs    12/29/22 2127 12/30/22 0520 12/31/22 0435 01/01/23 0644  WBC 8.3   < > 9.2 10.4  NEUTROABS 6.9  --   --   --   HGB 13.5   < > 13.9 14.0  HCT 40.6   < > 42.1 42.1  MCV 85.3   < > 85.6 85.9  PLT 272   < > 282 280   < > = values in this interval not displayed.   Cardiac Enzymes: No results for input(s): "CKTOTAL", "CKMB", "CKMBINDEX", "TROPONINIHS" in the last 72 hours. BNP: Recent Labs    12/31/22 0435  BNP 62.2   D-Dimer: No results for input(s): "DDIMER" in the last 72 hours. Hemoglobin A1C: No results for input(s): "HGBA1C" in the last 72 hours. Fasting Lipid Panel: No results for input(s): "CHOL", "HDL", "LDLCALC", "TRIG", "CHOLHDL", "LDLDIRECT" in the last 72 hours. Thyroid Function Tests: No results for input(s): "TSH", "T4TOTAL", "T3FREE", "THYROIDAB" in the last 72 hours.  Invalid input(s): "FREET3" Anemia Panel: No results for input(s): "VITAMINB12", "FOLATE", "FERRITIN", "TIBC", "IRON", "RETICCTPCT" in the last 72 hours.   Radiology: ECHOCARDIOGRAM COMPLETE  Result Date: 01/01/2023    ECHOCARDIOGRAM REPORT   Patient Name:   Erik Hendricks Date of Exam: 01/01/2023 Medical Rec #:  [id number]       Height:       72.0 in Accession #:    [id number]       Weight:       250.2 lb Date of Birth:  [birth year]       BSA:          2.343 m Patient Age:    82 years        BP:           99/80 mmHg Patient Gender: M               HR:           83 bpm. Exam Location:  [facility] Procedure: 2D Echo, Cardiac Doppler and Color Doppler STAT ECHO Indications:     Pericardial Effusion I31.3  History:         Patient has no prior history of Echocardiogram examinations.                  Arrythmias:Atrial Fibrillation, Signs/Symptoms:Murmur; Risk                  Factors:Hypertension.  Sonographer:     Cristela Blue Referring Phys:  [id number] [facility] BEDFORD WOUK Diagnosing Phys: Rozell Searing Custovic IMPRESSIONS  1. Left ventricular ejection fraction, by estimation, is 55 to 60%. Left ventricular ejection fraction by PLAX is 58 %. The left ventricle has normal function. The left ventricle has no regional wall motion abnormalities. Left ventricular diastolic parameters are consistent with Grade II diastolic dysfunction (pseudonormalization).  2. Right ventricular systolic function is normal. The right ventricular size is moderately enlarged. There is normal pulmonary artery systolic pressure. The estimated right ventricular systolic pressure is 25.5 mmHg.  3. Large pericardial effusion. The pericardial effusion is circumferential. There  is no evidence of cardiac tamponade.  4. The mitral valve is grossly normal. Trivial mitral valve regurgitation.  5. The aortic valve is calcified. Aortic valve regurgitation is not visualized. Aortic valve sclerosis/calcification is present, without any evidence of aortic stenosis.  6. Aortic dilatation noted. FINDINGS  Left Ventricle: Left ventricular ejection fraction, by estimation, is 55 to 60%. Left ventricular ejection fraction by PLAX is 58 %. The left ventricle has normal function. The left ventricle has no regional wall motion abnormalities. The left ventricular internal cavity size was normal in size. There is no left ventricular hypertrophy. Left ventricular  diastolic parameters are consistent with Grade II diastolic dysfunction (pseudonormalization). Right Ventricle: The right ventricular size is moderately enlarged. No increase in right ventricular wall thickness. Right ventricular systolic function is normal. There is normal pulmonary artery systolic pressure. The tricuspid regurgitant velocity is 1.62 m/s, and with an assumed right atrial pressure of 15 mmHg, the estimated right ventricular systolic pressure is 25.5 mmHg. Left Atrium: Left atrial size was normal in size. Right Atrium: Right atrial size was normal in size. Pericardium: A large pericardial effusion is present. The pericardial effusion is circumferential. There is diastolic collapse of the right atrial wall and diastolic collapse of the right ventricular free wall. There is no evidence of cardiac tamponade. Mitral Valve: The mitral valve is grossly normal. Trivial mitral valve regurgitation. Tricuspid Valve: The tricuspid valve is grossly normal. Tricuspid valve regurgitation is trivial. Aortic Valve: The aortic valve is calcified. Aortic valve regurgitation is not visualized. Aortic valve sclerosis/calcification is present, without any evidence of aortic stenosis. Aortic valve mean gradient measures 1.5 mmHg. Aortic valve peak gradient measures 2.9 mmHg. Aortic valve area, by VTI measures 2.51 cm. Pulmonic Valve: The pulmonic valve was grossly normal. Pulmonic valve regurgitation is not visualized. Aorta: Aortic dilatation noted. IAS/Shunts: No atrial level shunt detected by color flow Doppler.  LEFT VENTRICLE PLAX 2D LV EF:         Left            Diastology                ventricular     LV e' medial:    4.79 cm/s                ejection        LV E/e' medial:  14.4                fraction by     LV e' lateral:   4.57 cm/s                PLAX is 58      LV E/e' lateral: 15.1                %. LVIDd:         3.70 cm LVIDs:         2.60 cm LV PW:         0.90 cm LV IVS:        1.00 cm LVOT diam:      2.00 cm LV SV:         28 LV SV Index:   12 LVOT Area:     3.14 cm  RIGHT VENTRICLE RV Basal diam:  4.00 cm RV Mid diam:    2.70 cm LEFT ATRIUM             Index        RIGHT ATRIUM  Index LA diam:        4.40 cm 1.88 cm/m   RA Area:     12.70 cm LA Vol (A2C):   48.3 ml 20.61 ml/m  RA Volume:   33.20 ml  14.17 ml/m LA Vol (A4C):   44.7 ml 19.07 ml/m LA Biplane Vol: 47.7 ml 20.36 ml/m  AORTIC VALVE AV Area (Vmax):    1.84 cm AV Area (Vmean):   2.04 cm AV Area (VTI):     2.51 cm AV Vmax:           85.65 cm/s AV Vmean:          56.950 cm/s AV VTI:            0.110 m AV Peak Grad:      2.9 mmHg AV Mean Grad:      1.5 mmHg LVOT Vmax:         50.30 cm/s LVOT Vmean:        37.000 cm/s LVOT VTI:          0.088 m LVOT/AV VTI ratio: 0.80  AORTA Ao Root diam: 4.07 cm MITRAL VALVE               TRICUSPID VALVE MV Area (PHT): 3.68 cm    TR Peak grad:   10.5 mmHg MV Decel Time: 206 msec    TR Vmax:        162.00 cm/s MV E velocity: 69.20 cm/s                            SHUNTS                            Systemic VTI:  0.09 m                            Systemic Diam: 2.00 cm Rozell Searing Custovic Electronically signed by Clotilde Dieter Signature Date/Time: 01/01/2023/12:44:45 PM    Final    CT ABDOMEN PELVIS WO CONTRAST  Result Date: 12/31/2022 CLINICAL DATA:  Metastatic salivary gland tumor. Acute kidney injury. Transaminitis. * Tracking Code: BO * EXAM: CT ABDOMEN AND PELVIS WITHOUT CONTRAST TECHNIQUE: Multidetector CT imaging of the abdomen and pelvis was performed following the standard protocol without IV contrast. RADIATION DOSE REDUCTION: This exam was performed according to the departmental dose-optimization program which includes automated exposure control, adjustment of the mA and/or kV according to patient size and/or use of iterative reconstruction technique. COMPARISON:  Abdomen and pelvis CT 07/25/2011 FINDINGS: Lower chest: Moderate to large right pleural effusion incompletely visualized. There is  collapse/consolidation of the right lower lobe. Moderate pericardial effusion measuring up to 2.2 cm in thickness. Density of the pericardial fluid is too high to be simple fluid and may be proteinaceous or hemorrhagic in content. Hepatobiliary: No suspicious focal abnormality in the liver on this study without intravenous contrast. There is no evidence for gallstones, gallbladder wall thickening, or pericholecystic fluid. Increased attenuation of gallbladder fluid may be due to sludge. No intrahepatic or extrahepatic biliary dilation. Pancreas: No focal mass lesion. No dilatation of the main duct. No intraparenchymal cyst. No peripancreatic edema. Spleen: No splenomegaly. No suspicious focal mass lesion. Adrenals/Urinary Tract: No adrenal nodule or mass. Small cyst noted lower pole right kidney. Exophytic 3.2 cm cyst noted lower pole left kidney. No followup imaging is recommended. No hydronephrosis or  hydroureter. The urinary bladder appears normal for the degree of distention. Stomach/Bowel: Stomach is distended with gas and fluid. Duodenum is normally positioned as is the ligament of Treitz. Duodenal diverticulum noted. No small bowel wall thickening. No small bowel dilatation. The terminal ileum is normal. The appendix is not well visualized, but there is no edema or inflammation in the region of the cecal tip to suggest appendicitis. No gross colonic mass. No colonic wall thickening. Diverticuli are seen scattered along the entire length of the colon without CT findings of diverticulitis. Fluid is seen in the rectum, a finding that can be associated with clinical diarrhea. Vascular/Lymphatic: There is moderate atherosclerotic calcification of the abdominal aorta without aneurysm. There is no gastrohepatic or hepatoduodenal ligament lymphadenopathy. No retroperitoneal or mesenteric lymphadenopathy. No pelvic sidewall lymphadenopathy. Reproductive: Prostate gland is enlarged. Other: Left groin hernia contains  fat and a benign appearing calcification. Edema or hemorrhage is seen in the extraperitoneal left pelvic sidewall. Given the absence of a trauma history, urine leak from the left ureter is considered unlikely. There is a small volume of fluid adjacent to the liver and in both pericolic gutters. Musculoskeletal: Diffuse body wall edema evident. No worrisome lytic or sclerotic osseous abnormality. IMPRESSION: 1. Moderate to large right pleural effusion incompletely visualized. There is collapse/consolidation of the right lower lobe. 2. Moderate pericardial effusion measuring up to 2.2 cm in thickness. Density of the pericardial fluid is too high to be simple fluid compatible with proteinaceous or hemorrhagic pericardial fluid. 3. Edema or hemorrhage in the extraperitoneal left pelvic sidewall. This is of indeterminate etiology. Trace retroperitoneal hemorrhage could have this appearance especially if the patient has had recent left groin vascular access procedure. Given the absence of a trauma history, urine leak from the left ureter is considered unlikely. 4. Small volume of fluid adjacent to the liver and in both para colic gutters. 5. 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 6. Fluid in the rectum, a finding that can be associated with clinical diarrhea. 7. Left groin hernia contains fat and a benign appearing calcification. 8. Diffuse body wall edema. 9.  Aortic Atherosclerosis (ICD10-I70.0). These results will be called to the ordering clinician or representative by the Radiologist Assistant, and communication documented in the PACS or [employer]. Electronically Signed   By: Kennith Center M.D.   On: 12/31/2022 13:55   DG Chest Port 1 View  Result Date: 12/31/2022 CLINICAL DATA:  Tachycardia. EXAM: PORTABLE CHEST 1 VIEW COMPARISON:  12/26/2022 FINDINGS: Interval progression of patchy opacity in the right lung with increasing right pleural effusion. Left lung remains relatively clear. The  cardio pericardial silhouette is enlarged. No acute bony abnormality. Telemetry leads overlie the chest. IMPRESSION: Interval progression of patchy opacity in the right lung with increasing right pleural effusion. 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 Electronically Signed   By: Kennith Center M.D.   On: 12/31/2022 06:39   US THORACENTESIS ASP PLEURAL SPACE W/IMG GUIDE  Result Date: 12/26/2022 INDICATION: Right Therapeutic Thoracentesis for pleural effusion. Patient has history of right side lung mass EXAM: ULTRASOUND GUIDED RIGHT THORACENTESIS MEDICATIONS: 10 cc's of 1% Lidocaine COMPLICATIONS: None immediate. PROCEDURE: An ultrasound guided thoracentesis was thoroughly discussed with the patient and questions answered. The benefits, risks, alternatives and complications were also discussed. The patient understands and wishes to proceed with the procedure. Written consent was obtained. Ultrasound was performed to localize and mark an adequate pocket of fluid in the right chest. The area was  then prepped and draped in the normal sterile fashion. 1% Lidocaine was used for local anesthesia. Under ultrasound guidance a 8 Fr Safe-T-Centesis catheter was introduced. Thoracentesis was performed. The catheter was removed and a dressing applied. FINDINGS: A total of approximately 1.65 Liters of clear yellow fluid was removed. IMPRESSION: Successful ultrasound guided right thoracentesis yielding 1.65 Liters of pleural fluid. Performed by Ardith Dark, NP-C Electronically Signed   By: Olive Bass M.D.   On: 12/26/2022 11:58   DG Chest Port 1 View  Result Date: 12/26/2022 CLINICAL DATA:  post right thoracentesis EXAM: PORTABLE CHEST 1 VIEW COMPARISON:  Multiple priors FINDINGS: Improved aeration of the right lower lung with residual small to moderate right effusion status post thoracentesis. The heart appears at the upper limit of normal for size. The left lung  remains relatively clear. No pneumothorax. IMPRESSION: Improved aeration of the right lower lung with residual small to moderate right effusion. No pneumothorax. Electronically Signed   By: Olive Bass M.D.   On: 12/26/2022 11:33   DG Chest 2 View  Result Date: 12/26/2022 CLINICAL DATA:  shortness of breath EXAM: CHEST - 2 VIEW COMPARISON:  Multiple priors FINDINGS: The heart appears near the upper limit of normal for size. Dense opacification of the right lower lung. Left lung remains relatively clear. No acute osseous abnormality. IMPRESSION: Dense opacification of the right lower lung, probable combination of at least moderate effusion and associated atelectasis/consolidation. Electronically Signed   By: Olive Bass M.D.   On: 12/26/2022 11:32   DG Chest Port 1 View  Result Date: 12/21/2022 CLINICAL DATA:  Status post thoracentesis. EXAM: PORTABLE CHEST 1 VIEW COMPARISON:  Chest radiograph dated 12/19/2022. FINDINGS: Slight interval decrease in the size of right pleural effusion since the earlier radiograph. Residual small pleural effusion and right lung base atelectasis. Pneumonia is not excluded. The left lung is clear. There is no pneumothorax. Stable cardiac silhouette. No acute osseous pathology. IMPRESSION: Slight interval decrease in the size of right pleural effusion. No pneumothorax. Electronically Signed   By: Elgie Collard M.D.   On: 12/21/2022 21:15   DG Chest Port 1 View  Result Date: 12/19/2022 CLINICAL DATA:  Shortness of breath, weakness EXAM: PORTABLE CHEST 1 VIEW COMPARISON:  12/16/2022 FINDINGS: Stable cardiomegaly. Slightly increased moderate right pleural effusion and associated airspace opacities. The left lung is clear. No pneumothorax. IMPRESSION: Increased moderate right pleural effusion and associated atelectasis or pneumonia. Electronically Signed   By: Minerva Fester M.D.   On: 12/19/2022 21:58   US Venous Img Lower Bilateral (DVT)  Result Date:  12/16/2022 CLINICAL DATA:  Swelling EXAM: BILATERAL LOWER EXTREMITY VENOUS DOPPLER ULTRASOUND TECHNIQUE: Gray-scale sonography with compression, as well as color and duplex ultrasound, were performed to evaluate the deep venous system(s) from the level of the common femoral vein through the popliteal and proximal calf veins. COMPARISON:  03/28/2019 FINDINGS: VENOUS Normal compressibility of the common femoral, superficial femoral, and popliteal veins, as well as the visualized calf veins. Visualized portions of profunda femoral vein and great saphenous vein unremarkable. No filling defects to suggest DVT on grayscale or color Doppler imaging. Doppler waveforms show normal direction of venous flow, normal respiratory plasticity and response to augmentation. OTHER None. Limitations: none IMPRESSION: 1. No evidence of deep venous thrombosis within either lower extremity. Electronically Signed   By: Sharlet Salina M.D.   On: 12/16/2022 19:59   DG Chest Port 1 View  Result Date: 12/16/2022 CLINICAL DATA:  Pleural effusion EXAM: PORTABLE CHEST  1 VIEW COMPARISON:  12/15/2022 FINDINGS: The heart size and mediastinal contours are within normal limits. Slightly diminished, moderate, layering right pleural effusion and associated atelectasis or consolidation. No left pleural effusion. Left lung normally aerated. The visualized skeletal structures are unremarkable. IMPRESSION: Slightly diminished, moderate, layering right pleural effusion and associated atelectasis or consolidation. No left pleural effusion. Electronically Signed   By: Jearld Lesch M.D.   On: 12/16/2022 17:24   MR BRAIN W WO CONTRAST  Result Date: 12/15/2022 CLINICAL DATA:  Salivary ductal carcinoma. Assessment for metastatic disease. EXAM: MRI HEAD WITHOUT AND WITH CONTRAST TECHNIQUE: Multiplanar, multiecho pulse sequences of the brain and surrounding structures were obtained without and with intravenous contrast. CONTRAST:  10mL GADAVIST  GADOBUTROL 1 MMOL/ML IV SOLN COMPARISON:  None Available. FINDINGS: Brain: No acute infarct, mass effect or extra-axial collection. No acute or chronic hemorrhage. There is multifocal hyperintense T2-weighted signal within the white matter. Parenchymal volume and CSF spaces are normal. Vascular: Abnormal left vertebral artery flow void. Skull and upper cervical spine: Normal calvarium and skull base. Visualized upper cervical spine and soft tissues are normal. Sinuses/Orbits:Left mastoid effusion. Paranasal sinuses are clear. Ocular lens replacements. IMPRESSION: 1. No intracranial metastatic disease. 2. Abnormal left vertebral artery flow void, consistent with slow flow or occlusion. 3. Left mastoid effusion. Electronically Signed   By: Deatra Robinson M.D.   On: 12/15/2022 23:30   CT Soft Tissue Neck W Contrast  Result Date: 12/15/2022 CLINICAL DATA:  Soft tissue infection suspected, neck, xray done h/o L parotid cancer, change of voice, touble swallowing. EXAM: CT NECK WITH CONTRAST TECHNIQUE: Multidetector CT imaging of the neck was performed using the standard protocol following the bolus administration of intravenous contrast. RADIATION DOSE REDUCTION: This exam was performed according to the departmental dose-optimization program which includes automated exposure control, adjustment of the mA and/or kV according to patient size and/or use of iterative reconstruction technique. CONTRAST:  OMNIPAQUE IOHEXOL 350 MG/ML SOLN COMPARISON:  Neck CT 05/09/2018. FINDINGS: Pharynx and larynx: Normal. No mass or swelling.  Glottis is closed. Salivary glands: Prior left parotidectomy. Atrophy of the left submandibular gland, likely posttreatment. Normal right parotid and submandibular gland. Thyroid: Normal. Lymph nodes: Prior selective left neck dissection. No suspicious cervical lymphadenopathy. Vascular: Atherosclerotic calcifications of the carotid bulbs. Limited intracranial: Unremarkable. Visualized orbits:  Unremarkable. Mastoids and visualized paranasal sinuses: Well aerated. Skeleton: Diffuse idiopathic skeletal hyperostosis with prominent anterior osteophytes at T2-3. Upper chest: Lobulated mass in the anterior right upper lobe segment with possible extrapleural extension and mediastinal invasion, measuring up to 4.1 x 3.6 cm (axial image 135 series 5), suspicious for malignancy. Moderate right pleural effusion. Other: None. IMPRESSION: 1. Lobulated mass in the anterior right upper lobe segment with possible extrapleural extension and mediastinal invasion, measuring up to 4.1 cm, suspicious for malignancy. Dedicated chest CT is recommended for further evaluation. 2. Moderate right pleural effusion. 3. Prior left parotidectomy and selective left neck dissection. No suspicious cervical lymphadenopathy. Electronically Signed   By: Orvan Falconer M.D.   On: 12/15/2022 16:28   CT Head Wo Contrast  Result Date: 12/15/2022 CLINICAL DATA:  Mental status change, unknown cause. EXAM: CT HEAD WITHOUT CONTRAST TECHNIQUE: Contiguous axial images were obtained from the base of the skull through the vertex without intravenous contrast. RADIATION DOSE REDUCTION: This exam was performed according to the departmental dose-optimization program which includes automated exposure control, adjustment of the mA and/or kV according to patient size and/or use of iterative reconstruction technique. COMPARISON:  None Available. 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 Gray-white differentiation is otherwise preserved. Mild patchy hypoattenuation of the periventricular white matter, most consistent with mild chronic small-vessel disease. No hydrocephalus or extra-axial collection. No mass effect or midline shift. Vascular: No hyperdense vessel or unexpected calcification. Skull: No calvarial fracture or suspicious bone lesion. Skull base is unremarkable. Sinuses/Orbits: No acute findings. Other:  None. IMPRESSION: 1. 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 Consider MRI for further evaluation. 2. Mild chronic small-vessel disease. Electronically Signed   By: Orvan Falconer M.D.   On: 12/15/2022 16:16   CT Angio Chest Pulmonary Embolism (PE) W or WO Contrast  Result Date: 12/15/2022 CLINICAL DATA:  82 year old male with history of increasing shortness of breath. Evaluate for pulmonary embolism. EXAM: CT ANGIOGRAPHY CHEST WITH CONTRAST TECHNIQUE: Multidetector CT imaging of the chest was performed using the standard protocol during bolus administration of intravenous contrast. Multiplanar CT image reconstructions and MIPs were obtained to evaluate the vascular anatomy. RADIATION DOSE REDUCTION: This exam was performed according to the departmental dose-optimization program which includes automated exposure control, adjustment of the mA and/or kV according to patient size and/or use of iterative reconstruction technique. CONTRAST:  OMNIPAQUE IOHEXOL 350 MG/ML SOLN COMPARISON:  No priors. FINDINGS: Cardiovascular: Study is slightly limited by patient respiratory motion and suboptimal contrast bolus. With these limitations in mind there is no definite central, lobar or segmental sized filling defect in the pulmonary arterial tree to indicate clinically significant pulmonary embolism. However, there does appear to be a subsegmental sized embolus to the left upper lobe (axial image 182 of series 6) which appears either occlusive or nearly completely occlusive. Heart size is normal. Small amount of pericardial fluid and/or thickening. No pericardial calcification. There is aortic atherosclerosis, as well as atherosclerosis of the great vessels of the mediastinum and the coronary arteries, including calcified atherosclerotic plaque in the left main, left anterior descending, left circumflex and right coronary arteries. Thickening and calcification of the aortic valve.  Mediastinum/Nodes: No pathologically enlarged mediastinal or hilar lymph nodes. Esophagus is unremarkable in appearance. No axillary lymphadenopathy. Lungs/Pleura: Multiple nodular and mass-like areas of architectural distortion are noted in the anterior aspect of the right upper lobe, largest of which (axial image 87 of series 7) is estimated to measure approximately 4.4 x 3.6 cm and has macrolobulated and slightly spiculated margins, concerning for neoplasm. Dependent areas of atelectasis are also noted in the right lung. Consolidative changes are also noted in the base of the right lower lobe. Left lung appears clear. Large right pleural effusion lying dependently. Upper Abdomen: Visualized portions of the liver have a shrunken appearance and nodular contour, suggesting underlying cirrhosis. Musculoskeletal: There are no aggressive appearing lytic or blastic lesions noted in the visualized portions of the skeleton. Review of the MIP images confirms the above findings. IMPRESSION: 1. Study is positive for a small subsegmental sized embolus to the left upper lobe, as above. 2. Importantly, however, there are nodular and mass-like regions in the right lung, most notable for a 4.4 x 3.6 cm mass in the anterior aspect of the right upper lobe concerning for potential neoplasm. Associated with this is a large right pleural effusion which may be malignant. Further clinical evaluation and consideration for follow-up PET-CT in the near future is strongly recommended to better evaluate these findings. 3. This right pleural effusion is associated with considerable areas of atelectasis in the right lung, as well as some consolidative changes in the  basal segments of the right lower lobe concerning for pneumonia. 4. Aortic atherosclerosis, in addition to left main and three-vessel coronary artery disease. Please note that although the presence of coronary artery calcium documents the presence of coronary artery disease, the  severity of this disease and any potential stenosis cannot be assessed on this non-gated CT examination. Assessment for potential risk factor modification, dietary therapy or pharmacologic therapy may be warranted, if clinically indicated. 5. There are calcifications of the aortic valve. Echocardiographic correlation for evaluation of potential valvular dysfunction may be warranted if clinically indicated. Aortic Atherosclerosis (ICD10-I70.0). Electronically Signed   By: Trudie Reed M.D.   On: 12/15/2022 16:01   DG Chest 2 View  Result Date: 12/15/2022 CLINICAL DATA:  Shortness of breath. EXAM: CHEST - 2 VIEW COMPARISON:  None Available. FINDINGS: The heart size appears enlarged. There is obscuration of the right cardiac silhouette. Moderate-sized right pleural effusion with associated basilar atelectasis. Bilateral central perihilar prominence. No pneumothorax. No acute osseous abnormality. IMPRESSION: 1. Moderate-sized right pleural effusion with associated basilar atelectasis. 2. Patchy right middle and lower lung zone opacities may represent an infectious/inflammatory etiology. Electronically Signed   By: Hart Robinsons M.D.   On: 12/15/2022 15:27    ECHO as above  TELEMETRY reviewed by me 01/01/2023: Atrial fibrillation rate 90s  EKG reviewed by me: Atrial fibrillation rate 124 bpm  Data reviewed by me 01/01/2023: last 24h vitals tele labs imaging I/O ED provider note, admission H&P  Principal Problem:   Pleural effusion on right Active Problems:   Coronary artery disease   CVA (cerebral vascular accident) (HCC)   HTN (hypertension)   Salivary gland carcinoma (HCC)   Type 2 diabetes mellitus with hyperglycemia, without long-term current use of insulin (HCC)   Pulmonary embolism (HCC)   Chronic diastolic CHF (congestive heart failure) (HCC)   Obesity (BMI 30-39.9)   COVID-19 virus infection   A-fib (HCC)   Malignant pleural effusion   Pleural effusion    ASSESSMENT AND  PLAN:  Erik Hendricks is a 82 y.o. 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 Cardiology was consulted for further evaluation.   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  Recurrent malignant pleural effusions, was directly admitted to the hospital for Pleurx catheter placement for management of pleural effusions.  Found to have large pericardial effusion on CT scan.  Echo today with EF 55-60%, large circumferential pericardial effusion without evidence of tamponade. -Pericardial effusion likely 2/2 malignancy.  Patient would not be a candidate for pericardiocentesis given this, effusion would recur and patient would require pericardial window if patient and family wished to pursue intervention. -Recommend palliative/hospice consultation for discussion of GOC.  Patient's daughter expressed interest in this.  # Chronic atrial fibrillation With controlled rate this AM on tele.  -Anticoagulation held for procedure today.  Restart when appropriate -Continue metoprolol tartrate 50 mg twice daily for rate control.  # AKI Baseline Cr ~0.7, up to 3.41 on AM labs.  -Continue to monitor.    This patient's plan of care was discussed and created with Dr. Melton Alar and she is in agreement.  Signed: Gale Journey, PA-C  01/01/2023, 12:57 PM [facility]

## 2023-01-01 NOTE — TOC Initial Note (Signed)
Transition of Care [facility]) - Initial/Assessment Note    Patient Details  Name: Erik Hendricks MRN: [id number] Date of Birth: 01-07-1941  Transition [facility]) CM/SW Contact:    Darolyn Rua, LCSW Phone Number: 01/01/2023, 8:46 AM  Clinical Narrative:                  Patient from home with wife, direct admit for initiation of heparin drip coming off eliquis and to gt pleurx catheter.   Current plan for pleurx cath today, will defer to PT/OT recs for appropriate disposition once patient is medically stable for evals to be completed.   PcP: [name] Insurance: Medicare A and B  Expected Discharge Plan: Home w Home Health Services Barriers to Discharge: Continued Medical Work up   Patient Goals and CMS Choice Patient states their goals for this hospitalization and ongoing recovery are:: to go home CMS Medicare.gov Compare Post Acute Care list provided to:: Patient Choice offered to / list presented to : Patient      Expected Discharge Plan and Services       Living arrangements for the past 2 months: Single Family Home                                      Prior Living Arrangements/Services Living arrangements for the past 2 months: Single Family Home Lives with:: Spouse                   Activities of Daily Living   ADL Screening (condition at time of admission) Independently performs ADLs?: Yes (appropriate for developmental age) Is the patient deaf or have difficulty hearing?: No Does the patient have difficulty seeing, even when wearing glasses/contacts?: No Does the patient have difficulty concentrating, remembering, or making decisions?: No  Permission Sought/Granted                  Emotional Assessment       Orientation: : Oriented to Self, Oriented to Place, Oriented to  Time, Oriented to Situation Alcohol / Substance Use: Not Applicable Psych Involvement: No (comment)  Admission diagnosis:  Pleural effusion  [J90] Patient Active Problem List   Diagnosis Date Noted   Pleural effusion 12/30/2022   Goals of care, counseling/discussion 12/29/2022   Malignant pleural effusion 12/20/2022   COVID-19 virus infection 12/19/2022   Positive culture finding 12/19/2022   Acute respiratory failure with hypoxia (HCC) 12/19/2022   SOB (shortness of breath) 12/19/2022   A-fib (HCC)    Chronic diastolic CHF (congestive heart failure) (HCC) 12/16/2022   Obesity (BMI 30-39.9) 12/16/2022   Hyponatremia 12/16/2022   Hypokalemia 12/16/2022   Spinal stenosis of lumbar region 12/15/2022   Type 2 diabetes mellitus with hyperglycemia, without long-term current use of insulin (HCC) 12/15/2022   Pleural effusion on right 12/15/2022   Pulmonary embolism (HCC) 12/15/2022   Mass of upper lobe of right lung 12/15/2022   Ageusia 12/15/2022   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   Longstanding persistent atrial fibrillation (HCC) 08/19/2018   Salivary gland carcinoma (HCC) 06/06/2018   Coronary artery disease 10/18/2015   IBS (irritable bowel syndrome) 10/18/2015   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 10/18/2015   Cataract 09/03/2014   Preop cardiovascular exam 08/05/2014   SOB (shortness of breath) on exertion 02/03/2014   CVA (cerebral vascular accident) (HCC) 05/29/2013   HTN (hypertension) 05/29/2013   Hyperlipidemia 05/29/2013  Inflammatory bowel syndrome 05/29/2013   Left ventricular dysfunction 05/29/2013   TIA (transient ischemic attack) 05/29/2013   Transient visual loss of right eye 05/29/2013   Rectal pain, chronic 08/13/2012   Anorectal pain 08/13/2012   Other specified diseases of anus and rectum 08/13/2012   S/P cardiac cath 07/27/2000   PCP:  [name], MD Pharmacy:   [facility] [phone] [address], [facility] - [address] [place] [address] Phone: [phone] Fax: [phone]     Social Determinants of Health (SDOH) Social  History: SDOH Screenings   Food Insecurity: No Food Insecurity (12/29/2022)  Housing: Low Risk  (12/29/2022)  Transportation Needs: No Transportation Needs (12/29/2022)  Utilities: Not At Risk (12/29/2022)  Depression (PHQ2-9): Low Risk  (12/29/2022)  Financial Resource Strain: Patient Declined (08/08/2022)   Received from [facility], [facility]  Tobacco Use: Medium Risk (12/29/2022)   SDOH Interventions:     Readmission Risk Interventions    12/30/2022    1:57 PM  Readmission Risk Prevention Plan  Transportation Screening Complete  PCP or Specialist Appt within 3-5 Days Complete  HRI or Home Care Consult Complete  Social Work Consult for Recovery Care Planning/Counseling Complete  Palliative Care Screening Not Applicable  Medication Review [employer]) Complete

## 2023-01-01 NOTE — Progress Notes (Signed)
*  PRELIMINARY RESULTS* Echocardiogram 2D Echocardiogram has been performed.  Cristela Blue 01/01/2023, 8:14 AM

## 2023-01-01 NOTE — Care Management Important Message (Signed)
Important Message  Patient Details  Name: Erik Hendricks MRN: [id number] Date of Birth: 10/10/1940   Important Message Given:  N/A - LOS <3 / Initial given by admissions     [name] 01/01/2023, 2:28 PM

## 2023-01-01 NOTE — Consult Note (Addendum)
[facility]  [phone] [phone]  ID: Erik Hendricks OB: [birth year]  MR#: [id number]  AOZ#:[id number]  Patient Care Team: [name], MD as PCP - General (Family Medicine) Lemar Livings, Merrily Pew, MD (General Surgery) Michaelyn Barter, MD as Consulting Physician (Oncology)  REFERRING PROVIDER: Dr. Ashok Pall  REASON FOR REFERRAL: Metastatic high-grade salivary duct cancer  ASSESSMENT AND PLAN:   Erik Hendricks is a 82 y.o. 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  Presented to [facility] with recurrent right pleural effusion.  Cytology consistent with metastatic high-grade salivary ductal cancer.  Patient was seen by me on 12/29/2022 and was a direct admit to the hospital for palliative Pleurx catheter placement.   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  Completed at [facility].   -s/p thoracentesis x3 in the past 2 weeks.  Cytology consistent with metastatic high-grade salivary duct cancer.   -CTA chest From 12/15/2022 showed small subsegmental PE in the left upper lobe, 4.4 x 3.6 cm nodular and masslike lesions in the right lung concerning for malignancy, large right pleural effusion.  PET recommended.  -s/p Pleurx catheter placement today.  Patient has been declining rapidly.  On lab he has been developing renal failure and hepatic failure.  CT abdomen pelvis is also  showing new pericardial effusion.  Overall the prognosis is very poor.  He is not a candidate for any cytotoxic chemotherapy agent.  His tumor is expressing androgen positivity and I discussed about Lupron but I do not think it is going to be beneficial considering how quickly he is deteriorating.  We discussed about palliative care and transitioning into comfort and hospice.  Family understands the poor prognosis and is agreeable to transition to hospice.  Patient did not appear in pain.  Their major concern is the anxiety during the evening time.  Can consider as needed Ativan for symptom management.  Case was discussed with hospitalist Dr. Ashok Pall.   Patient expressed understanding and was in agreement with this plan. He also understands that He can call clinic at any time with any questions, concerns, or complaints.   I spent a total of 75 minutes reviewing chart data, face-to-face evaluation with the patient, counseling and coordination of care as detailed above.  HPI: Erik Hendricks is a 82 y.o. 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  Presented to [facility] with recurrent right pleural effusion.  Cytology consistent with metastatic high-grade salivary ductal cancer.  Patient was seen by me on 12/29/2022 and was a direct admit to the hospital for palliative Pleurx catheter placement.  Seen today at bedside accompanied with other family member.  He was sleeping.  Per family reports he has been spending a lot of time sleeping.  Not interacting much.    REVIEW  OF SYSTEMS:   ROS  As per HPI. Otherwise, a complete review of systems is negative.  PAST MEDICAL HISTORY: Past Medical History:  Diagnosis Date   A-fib [facility])    Anal fissure    Colon polyp     Hemorrhoid    Hypertension    Murmur    Rectal abscess     PAST SURGICAL HISTORY: Past Surgical History:  Procedure Laterality Date   ANAL FISTULECTOMY  September 2013   Posterior subcutaneous fistula treated by fistulotomy.   COLONOSCOPY  2007, 2012   Dr Lemar Livings   COLONOSCOPY WITH PROPOFOL N/A 06/21/2018   Procedure: COLONOSCOPY WITH PROPOFOL;  Surgeon: Christena Deem, MD;  Location: [facility] ENDOSCOPY;  Service: Endoscopy;  Laterality: N/A;   FISSURECTOMY     40 yrs ago   KNEE SURGERY Left 2005   POLYPECTOMY  2007   RECTAL SURGERY  40 yrs ago   cyst    FAMILY HISTORY: Family History  Problem Relation Age of Onset   Heart Problems Father    Prostate cancer Neg Hx    Bladder Cancer Neg Hx     HEALTH MAINTENANCE: Social History   Tobacco Use   Smoking status: Former    Types: Cigars   Smokeless tobacco: Never   Tobacco comments:    Quit over 50 years ago. Smoked 2-3 a week.  Substance Use Topics   Alcohol use: No   Drug use: No     Allergies  Allergen Reactions   Ciprofloxacin Nausea Only    Current Facility-Administered Medications  Medication Dose Route Frequency Provider Last Rate Last Admin   acetaminophen (TYLENOL) tablet 650 mg  650 mg Oral Q6H PRN Gertha Calkin, MD       Or   acetaminophen (TYLENOL) suppository 650 mg  650 mg Rectal Q6H PRN Gertha Calkin, MD       balsalazide (COLAZAL) capsule 1,500 mg  1,500 mg Oral BID Irena Cords V, MD   1,500 mg at 12/31/22 2312   DULoxetine (CYMBALTA) DR capsule 60 mg  60 mg Oral Daily Gertha Calkin, MD   60 mg at 12/31/22 1023   fluticasone (FLONASE) 50 MCG/ACT nasal spray 1 spray  1 spray Each Nare Daily Gertha Calkin, MD       hydrALAZINE (APRESOLINE) injection 10 mg  10 mg Intravenous Q6H PRN Gertha Calkin, MD       insulin aspart (novoLOG) injection 0-9 Units  0-9 Units Subcutaneous TID WC Gertha Calkin, MD       metoprolol tartrate (LOPRESSOR) injection 5 mg  5 mg Intravenous Q15 min PRN Custovic, Sabina, DO        metoprolol tartrate (LOPRESSOR) tablet 50 mg  50 mg Oral BID Kathrynn Running, MD   50 mg at 12/31/22 2217   patiromer Lelon Perla) packet 8.4 g  8.4 g Oral Daily Wouk, Wilfred Curtis, MD       pregabalin (LYRICA) capsule 75 mg  75 mg Oral BID Wouk, Wilfred Curtis, MD       sodium bicarbonate 150 mEq in dextrose 5 % 1,150 mL infusion   Intravenous Continuous Kathrynn Running, MD 100 mL/hr at 01/01/23 1352 New Bag at 01/01/23 1352   sodium chloride flush (NS) 0.9 % injection 3 mL  3 mL Intravenous Q12H Irena Cords V, MD   3 mL at 12/31/22 1024    OBJECTIVE: Vitals:   01/01/23 1050 01/01/23 1212  BP: 100/77 125/73  Pulse:  75  Resp: 14   Temp:  (!) 97.5 F (36.4 C)  SpO2: 96% 93%     Body mass index is 33.94 kg/m.      General: Well-developed, well-nourished, no acute distress. Eyes: Pink conjunctiva, anicteric sclera. HEENT: Normocephalic, moist mucous membranes, clear oropharnyx. Lungs: Clear to auscultation bilaterally. Heart: Regular rate and rhythm. No rubs, murmurs, or gallops. Abdomen: Soft, nontender, nondistended. No organomegaly noted, normoactive bowel sounds. Musculoskeletal: No edema, cyanosis, or clubbing. Neuro: Alert, answering all questions appropriately. Cranial nerves grossly intact. Skin: No rashes or petechiae noted. Psych: Normal affect. Lymphatics: No cervical, calvicular, axillary or inguinal LAD.   LAB RESULTS:  Lab Results  Component Value Date   NA 129 (L) 01/01/2023   K 5.3 (H) 01/01/2023   CL 95 (L) 01/01/2023   CO2 16 (L) 01/01/2023   GLUCOSE 85 01/01/2023   BUN 85 (H) 01/01/2023   CREATININE 3.41 (H) 01/01/2023   CALCIUM 8.4 (L) 01/01/2023   PROT 5.6 (L) 01/01/2023   ALBUMIN 3.1 (L) 01/01/2023   AST 608 (H) 01/01/2023   ALT 122 (H) 01/01/2023   ALKPHOS 254 (H) 01/01/2023   BILITOT 2.9 (H) 01/01/2023   GFRNONAA 17 (L) 01/01/2023    Lab Results  Component Value Date   WBC 10.4 01/01/2023   NEUTROABS 6.9 12/29/2022   HGB 14.0  01/01/2023   HCT 42.1 01/01/2023   MCV 85.9 01/01/2023   PLT 280 01/01/2023    No results found for: "TIBC", "FERRITIN", "IRONPCTSAT"   STUDIES: ECHOCARDIOGRAM COMPLETE  Result Date: 01/01/2023    ECHOCARDIOGRAM REPORT   Patient Name:   Erik Hendricks Date of Exam: 01/01/2023 Medical Rec #:  [id number]       Height:       72.0 in Accession #:    [id number]      Weight:       250.2 lb Date of Birth:  September 21, 1940       BSA:          2.343 m Patient Age:    82 years        BP:           99/80 mmHg Patient Gender: M               HR:           83 bpm. Exam Location:  [facility] Procedure: 2D Echo, Cardiac Doppler and Color Doppler STAT ECHO Indications:     Pericardial Effusion I31.3  History:         Patient has no prior history of Echocardiogram examinations.                  Arrythmias:Atrial Fibrillation, Signs/Symptoms:Murmur; Risk                  Factors:Hypertension.  Sonographer:     Cristela Blue Referring Phys:  [id number] [facility] BEDFORD WOUK Diagnosing Phys: Rozell Searing Custovic IMPRESSIONS  1. Left ventricular ejection fraction, by estimation, is 55 to 60%. Left ventricular ejection fraction by PLAX is 58 %. The left ventricle has normal function. The left ventricle has no regional wall motion abnormalities. Left ventricular diastolic parameters are consistent with Grade II diastolic dysfunction (pseudonormalization).  2. Right ventricular systolic function is normal. The right ventricular size is moderately enlarged. There is normal pulmonary artery systolic pressure. The estimated right ventricular systolic pressure is 25.5 mmHg.  3. Large pericardial effusion. The pericardial effusion is circumferential. There is no evidence of cardiac tamponade.  4.  The mitral valve is grossly normal. Trivial mitral valve regurgitation.  5. The aortic valve is calcified. Aortic valve regurgitation is not visualized. Aortic valve sclerosis/calcification is present, without any evidence of aortic stenosis.  6. Aortic dilatation  noted. FINDINGS  Left Ventricle: Left ventricular ejection fraction, by estimation, is 55 to 60%. Left ventricular ejection fraction by PLAX is 58 %. The left ventricle has normal function. The left ventricle has no regional wall motion abnormalities. The left ventricular internal cavity size was normal in size. There is no left ventricular hypertrophy. Left ventricular diastolic parameters are consistent with Grade II diastolic dysfunction (pseudonormalization). Right Ventricle: The right ventricular size is moderately enlarged. No increase in right ventricular wall thickness. Right ventricular systolic function is normal. There is normal pulmonary artery systolic pressure. The tricuspid regurgitant velocity is 1.62 m/s, and with an assumed right atrial pressure of 15 mmHg, the estimated right ventricular systolic pressure is 25.5 mmHg. Left Atrium: Left atrial size was normal in size. Right Atrium: Right atrial size was normal in size. Pericardium: A large pericardial effusion is present. The pericardial effusion is circumferential. There is diastolic collapse of the right atrial wall and diastolic collapse of the right ventricular free wall. There is no evidence of cardiac tamponade. Mitral Valve: The mitral valve is grossly normal. Trivial mitral valve regurgitation. Tricuspid Valve: The tricuspid valve is grossly normal. Tricuspid valve regurgitation is trivial. Aortic Valve: The aortic valve is calcified. Aortic valve regurgitation is not visualized. Aortic valve sclerosis/calcification is present, without any evidence of aortic stenosis. Aortic valve mean gradient measures 1.5 mmHg. Aortic valve peak gradient measures 2.9 mmHg. Aortic valve area, by VTI measures 2.51 cm. Pulmonic Valve: The pulmonic valve was grossly normal. Pulmonic valve regurgitation is not visualized. Aorta: Aortic dilatation noted. IAS/Shunts: No atrial level shunt detected by color flow Doppler.  LEFT VENTRICLE PLAX 2D LV EF:          Left            Diastology                ventricular     LV e' medial:    4.79 cm/s                ejection        LV E/e' medial:  14.4                fraction by     LV e' lateral:   4.57 cm/s                PLAX is 58      LV E/e' lateral: 15.1                %. LVIDd:         3.70 cm LVIDs:         2.60 cm LV PW:         0.90 cm LV IVS:        1.00 cm LVOT diam:     2.00 cm LV SV:         28 LV SV Index:   12 LVOT Area:     3.14 cm  RIGHT VENTRICLE RV Basal diam:  4.00 cm RV Mid diam:    2.70 cm LEFT ATRIUM             Index        RIGHT ATRIUM  Index LA diam:        4.40 cm 1.88 cm/m   RA Area:     12.70 cm LA Vol (A2C):   48.3 ml 20.61 ml/m  RA Volume:   33.20 ml  14.17 ml/m LA Vol (A4C):   44.7 ml 19.07 ml/m LA Biplane Vol: 47.7 ml 20.36 ml/m  AORTIC VALVE AV Area (Vmax):    1.84 cm AV Area (Vmean):   2.04 cm AV Area (VTI):     2.51 cm AV Vmax:           85.65 cm/s AV Vmean:          56.950 cm/s AV VTI:            0.110 m AV Peak Grad:      2.9 mmHg AV Mean Grad:      1.5 mmHg LVOT Vmax:         50.30 cm/s LVOT Vmean:        37.000 cm/s LVOT VTI:          0.088 m LVOT/AV VTI ratio: 0.80  AORTA Ao Root diam: 4.07 cm MITRAL VALVE               TRICUSPID VALVE MV Area (PHT): 3.68 cm    TR Peak grad:   10.5 mmHg MV Decel Time: 206 msec    TR Vmax:        162.00 cm/s MV E velocity: 69.20 cm/s                            SHUNTS                            Systemic VTI:  0.09 m                            Systemic Diam: 2.00 cm Rozell Searing Custovic Electronically signed by Clotilde Dieter Signature Date/Time: 01/01/2023/12:44:45 PM    Final    CT ABDOMEN PELVIS WO CONTRAST  Result Date: 12/31/2022 CLINICAL DATA:  Metastatic salivary gland tumor. Acute kidney injury. Transaminitis. * Tracking Code: BO * EXAM: CT ABDOMEN AND PELVIS WITHOUT CONTRAST TECHNIQUE: Multidetector CT imaging of the abdomen and pelvis was performed following the standard protocol without IV contrast. RADIATION DOSE REDUCTION:  This exam was performed according to the departmental dose-optimization program which includes automated exposure control, adjustment of the mA and/or kV according to patient size and/or use of iterative reconstruction technique. COMPARISON:  Abdomen and pelvis CT 07/25/2011 FINDINGS: Lower chest: Moderate to large right pleural effusion incompletely visualized. There is collapse/consolidation of the right lower lobe. Moderate pericardial effusion measuring up to 2.2 cm in thickness. Density of the pericardial fluid is too high to be simple fluid and may be proteinaceous or hemorrhagic in content. Hepatobiliary: No suspicious focal abnormality in the liver on this study without intravenous contrast. There is no evidence for gallstones, gallbladder wall thickening, or pericholecystic fluid. Increased attenuation of gallbladder fluid may be due to sludge. No intrahepatic or extrahepatic biliary dilation. Pancreas: No focal mass lesion. No dilatation of the main duct. No intraparenchymal cyst. No peripancreatic edema. Spleen: No splenomegaly. No suspicious focal mass lesion. Adrenals/Urinary Tract: No adrenal nodule or mass. Small cyst noted lower pole right kidney. Exophytic 3.2 cm cyst noted lower pole left kidney. No followup imaging is recommended. No hydronephrosis or  hydroureter. The urinary bladder appears normal for the degree of distention. Stomach/Bowel: Stomach is distended with gas and fluid. Duodenum is normally positioned as is the ligament of Treitz. Duodenal diverticulum noted. No small bowel wall thickening. No small bowel dilatation. The terminal ileum is normal. The appendix is not well visualized, but there is no edema or inflammation in the region of the cecal tip to suggest appendicitis. No gross colonic mass. No colonic wall thickening. Diverticuli are seen scattered along the entire length of the colon without CT findings of diverticulitis. Fluid is seen in the rectum, a finding that can be  associated with clinical diarrhea. Vascular/Lymphatic: There is moderate atherosclerotic calcification of the abdominal aorta without aneurysm. There is no gastrohepatic or hepatoduodenal ligament lymphadenopathy. No retroperitoneal or mesenteric lymphadenopathy. No pelvic sidewall lymphadenopathy. Reproductive: Prostate gland is enlarged. Other: Left groin hernia contains fat and a benign appearing calcification. Edema or hemorrhage is seen in the extraperitoneal left pelvic sidewall. Given the absence of a trauma history, urine leak from the left ureter is considered unlikely. There is a small volume of fluid adjacent to the liver and in both pericolic gutters. Musculoskeletal: Diffuse body wall edema evident. No worrisome lytic or sclerotic osseous abnormality. IMPRESSION: 1. Moderate to large right pleural effusion incompletely visualized. There is collapse/consolidation of the right lower lobe. 2. Moderate pericardial effusion measuring up to 2.2 cm in thickness. Density of the pericardial fluid is too high to be simple fluid compatible with proteinaceous or hemorrhagic pericardial fluid. 3. Edema or hemorrhage in the extraperitoneal left pelvic sidewall. This is of indeterminate etiology. Trace retroperitoneal hemorrhage could have this appearance especially if the patient has had recent left groin vascular access procedure. Given the absence of a trauma history, urine leak from the left ureter is considered unlikely. 4. Small volume of fluid adjacent to the liver and in both para colic gutters. 5. 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 6. Fluid in the rectum, a finding that can be associated with clinical diarrhea. 7. Left groin hernia contains fat and a benign appearing calcification. 8. Diffuse body wall edema. 9.  Aortic Atherosclerosis (ICD10-I70.0). These results will be called to the ordering clinician or representative by the Radiologist Assistant, and communication documented in the  PACS or [employer]. Electronically Signed   By: Kennith Center M.D.   On: 12/31/2022 13:55   DG Chest Port 1 View  Result Date: 12/31/2022 CLINICAL DATA:  Tachycardia. EXAM: PORTABLE CHEST 1 VIEW COMPARISON:  12/26/2022 FINDINGS: Interval progression of patchy opacity in the right lung with increasing right pleural effusion. Left lung remains relatively clear. The cardio pericardial silhouette is enlarged. No acute bony abnormality. Telemetry leads overlie the chest. IMPRESSION: Interval progression of patchy opacity in the right lung with increasing right pleural effusion. 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 Electronically Signed   By: Kennith Center M.D.   On: 12/31/2022 06:39   US THORACENTESIS ASP PLEURAL SPACE W/IMG GUIDE  Result Date: 12/26/2022 INDICATION: Right Therapeutic Thoracentesis for pleural effusion. Patient has history of right side lung mass EXAM: ULTRASOUND GUIDED RIGHT THORACENTESIS MEDICATIONS: 10 cc's of 1% Lidocaine COMPLICATIONS: None immediate. PROCEDURE: An ultrasound guided thoracentesis was thoroughly discussed with the patient and questions answered. The benefits, risks, alternatives and complications were also discussed. The patient understands and wishes to proceed with the procedure. Written consent was obtained. Ultrasound was performed to localize and mark an adequate pocket of fluid in the right chest. The area was  then prepped and draped in the normal sterile fashion. 1% Lidocaine was used for local anesthesia. Under ultrasound guidance a 8 Fr Safe-T-Centesis catheter was introduced. Thoracentesis was performed. The catheter was removed and a dressing applied. FINDINGS: A total of approximately 1.65 Liters of clear yellow fluid was removed. IMPRESSION: Successful ultrasound guided right thoracentesis yielding 1.65 Liters of pleural fluid. Performed by Ardith Dark, NP-C Electronically Signed   By: Olive Bass  M.D.   On: 12/26/2022 11:58   DG Chest Port 1 View  Result Date: 12/26/2022 CLINICAL DATA:  post right thoracentesis EXAM: PORTABLE CHEST 1 VIEW COMPARISON:  Multiple priors FINDINGS: Improved aeration of the right lower lung with residual small to moderate right effusion status post thoracentesis. The heart appears at the upper limit of normal for size. The left lung remains relatively clear. No pneumothorax. IMPRESSION: Improved aeration of the right lower lung with residual small to moderate right effusion. No pneumothorax. Electronically Signed   By: Olive Bass M.D.   On: 12/26/2022 11:33   DG Chest 2 View  Result Date: 12/26/2022 CLINICAL DATA:  shortness of breath EXAM: CHEST - 2 VIEW COMPARISON:  Multiple priors FINDINGS: The heart appears near the upper limit of normal for size. Dense opacification of the right lower lung. Left lung remains relatively clear. No acute osseous abnormality. IMPRESSION: Dense opacification of the right lower lung, probable combination of at least moderate effusion and associated atelectasis/consolidation. Electronically Signed   By: Olive Bass M.D.   On: 12/26/2022 11:32   DG Chest Port 1 View  Result Date: 12/21/2022 CLINICAL DATA:  Status post thoracentesis. EXAM: PORTABLE CHEST 1 VIEW COMPARISON:  Chest radiograph dated 12/19/2022. FINDINGS: Slight interval decrease in the size of right pleural effusion since the earlier radiograph. Residual small pleural effusion and right lung base atelectasis. Pneumonia is not excluded. The left lung is clear. There is no pneumothorax. Stable cardiac silhouette. No acute osseous pathology. IMPRESSION: Slight interval decrease in the size of right pleural effusion. No pneumothorax. Electronically Signed   By: Elgie Collard M.D.   On: 12/21/2022 21:15   DG Chest Port 1 View  Result Date: 12/19/2022 CLINICAL DATA:  Shortness of breath, weakness EXAM: PORTABLE CHEST 1 VIEW COMPARISON:  12/16/2022 FINDINGS: Stable  cardiomegaly. Slightly increased moderate right pleural effusion and associated airspace opacities. The left lung is clear. No pneumothorax. IMPRESSION: Increased moderate right pleural effusion and associated atelectasis or pneumonia. Electronically Signed   By: Minerva Fester M.D.   On: 12/19/2022 21:58   US Venous Img Lower Bilateral (DVT)  Result Date: 12/16/2022 CLINICAL DATA:  Swelling EXAM: BILATERAL LOWER EXTREMITY VENOUS DOPPLER ULTRASOUND TECHNIQUE: Gray-scale sonography with compression, as well as color and duplex ultrasound, were performed to evaluate the deep venous system(s) from the level of the common femoral vein through the popliteal and proximal calf veins. COMPARISON:  03/28/2019 FINDINGS: VENOUS Normal compressibility of the common femoral, superficial femoral, and popliteal veins, as well as the visualized calf veins. Visualized portions of profunda femoral vein and great saphenous vein unremarkable. No filling defects to suggest DVT on grayscale or color Doppler imaging. Doppler waveforms show normal direction of venous flow, normal respiratory plasticity and response to augmentation. OTHER None. Limitations: none IMPRESSION: 1. No evidence of deep venous thrombosis within either lower extremity. Electronically Signed   By: Sharlet Salina M.D.   On: 12/16/2022 19:59   DG Chest Port 1 View  Result Date: 12/16/2022 CLINICAL DATA:  Pleural effusion EXAM: PORTABLE CHEST  1 VIEW COMPARISON:  12/15/2022 FINDINGS: The heart size and mediastinal contours are within normal limits. Slightly diminished, moderate, layering right pleural effusion and associated atelectasis or consolidation. No left pleural effusion. Left lung normally aerated. The visualized skeletal structures are unremarkable. IMPRESSION: Slightly diminished, moderate, layering right pleural effusion and associated atelectasis or consolidation. No left pleural effusion. Electronically Signed   By: Jearld Lesch M.D.   On:  12/16/2022 17:24   MR BRAIN W WO CONTRAST  Result Date: 12/15/2022 CLINICAL DATA:  Salivary ductal carcinoma. Assessment for metastatic disease. EXAM: MRI HEAD WITHOUT AND WITH CONTRAST TECHNIQUE: Multiplanar, multiecho pulse sequences of the brain and surrounding structures were obtained without and with intravenous contrast. CONTRAST:  10mL GADAVIST GADOBUTROL 1 MMOL/ML IV SOLN COMPARISON:  None Available. FINDINGS: Brain: No acute infarct, mass effect or extra-axial collection. No acute or chronic hemorrhage. There is multifocal hyperintense T2-weighted signal within the white matter. Parenchymal volume and CSF spaces are normal. Vascular: Abnormal left vertebral artery flow void. Skull and upper cervical spine: Normal calvarium and skull base. Visualized upper cervical spine and soft tissues are normal. Sinuses/Orbits:Left mastoid effusion. Paranasal sinuses are clear. Ocular lens replacements. IMPRESSION: 1. No intracranial metastatic disease. 2. Abnormal left vertebral artery flow void, consistent with slow flow or occlusion. 3. Left mastoid effusion. Electronically Signed   By: Deatra Robinson M.D.   On: 12/15/2022 23:30   CT Soft Tissue Neck W Contrast  Result Date: 12/15/2022 CLINICAL DATA:  Soft tissue infection suspected, neck, xray done h/o L parotid cancer, change of voice, touble swallowing. EXAM: CT NECK WITH CONTRAST TECHNIQUE: Multidetector CT imaging of the neck was performed using the standard protocol following the bolus administration of intravenous contrast. RADIATION DOSE REDUCTION: This exam was performed according to the departmental dose-optimization program which includes automated exposure control, adjustment of the mA and/or kV according to patient size and/or use of iterative reconstruction technique. CONTRAST:  OMNIPAQUE IOHEXOL 350 MG/ML SOLN COMPARISON:  Neck CT 05/09/2018. FINDINGS: Pharynx and larynx: Normal. No mass or swelling.  Glottis is closed. Salivary glands:  Prior left parotidectomy. Atrophy of the left submandibular gland, likely posttreatment. Normal right parotid and submandibular gland. Thyroid: Normal. Lymph nodes: Prior selective left neck dissection. No suspicious cervical lymphadenopathy. Vascular: Atherosclerotic calcifications of the carotid bulbs. Limited intracranial: Unremarkable. Visualized orbits: Unremarkable. Mastoids and visualized paranasal sinuses: Well aerated. Skeleton: Diffuse idiopathic skeletal hyperostosis with prominent anterior osteophytes at T2-3. Upper chest: Lobulated mass in the anterior right upper lobe segment with possible extrapleural extension and mediastinal invasion, measuring up to 4.1 x 3.6 cm (axial image 135 series 5), suspicious for malignancy. Moderate right pleural effusion. Other: None. IMPRESSION: 1. Lobulated mass in the anterior right upper lobe segment with possible extrapleural extension and mediastinal invasion, measuring up to 4.1 cm, suspicious for malignancy. Dedicated chest CT is recommended for further evaluation. 2. Moderate right pleural effusion. 3. Prior left parotidectomy and selective left neck dissection. No suspicious cervical lymphadenopathy. Electronically Signed   By: Orvan Falconer M.D.   On: 12/15/2022 16:28   CT Head Wo Contrast  Result Date: 12/15/2022 CLINICAL DATA:  Mental status change, unknown cause. EXAM: CT HEAD WITHOUT CONTRAST TECHNIQUE: Contiguous axial images were obtained from the base of the skull through the vertex without intravenous contrast. RADIATION DOSE REDUCTION: This exam was performed according to the departmental dose-optimization program which includes automated exposure control, adjustment of the mA and/or kV according to patient size and/or use of iterative reconstruction technique. COMPARISON:  None Available. 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 Gray-white differentiation is otherwise preserved. Mild patchy  hypoattenuation of the periventricular white matter, most consistent with mild chronic small-vessel disease. No hydrocephalus or extra-axial collection. No mass effect or midline shift. Vascular: No hyperdense vessel or unexpected calcification. Skull: No calvarial fracture or suspicious bone lesion. Skull base is unremarkable. Sinuses/Orbits: No acute findings. Other: None. IMPRESSION: 1. 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 Consider MRI for further evaluation. 2. Mild chronic small-vessel disease. Electronically Signed   By: Orvan Falconer M.D.   On: 12/15/2022 16:16   CT Angio Chest Pulmonary Embolism (PE) W or WO Contrast  Result Date: 12/15/2022 CLINICAL DATA:  82 year old male with history of increasing shortness of breath. Evaluate for pulmonary embolism. EXAM: CT ANGIOGRAPHY CHEST WITH CONTRAST TECHNIQUE: Multidetector CT imaging of the chest was performed using the standard protocol during bolus administration of intravenous contrast. Multiplanar CT image reconstructions and MIPs were obtained to evaluate the vascular anatomy. RADIATION DOSE REDUCTION: This exam was performed according to the departmental dose-optimization program which includes automated exposure control, adjustment of the mA and/or kV according to patient size and/or use of iterative reconstruction technique. CONTRAST:  OMNIPAQUE IOHEXOL 350 MG/ML SOLN COMPARISON:  No priors. FINDINGS: Cardiovascular: Study is slightly limited by patient respiratory motion and suboptimal contrast bolus. With these limitations in mind there is no definite central, lobar or segmental sized filling defect in the pulmonary arterial tree to indicate clinically significant pulmonary embolism. However, there does appear to be a subsegmental sized embolus to the left upper lobe (axial image 182 of series 6) which appears either occlusive or nearly completely occlusive. Heart size is normal. Small amount of pericardial fluid  and/or thickening. No pericardial calcification. There is aortic atherosclerosis, as well as atherosclerosis of the great vessels of the mediastinum and the coronary arteries, including calcified atherosclerotic plaque in the left main, left anterior descending, left circumflex and right coronary arteries. Thickening and calcification of the aortic valve. Mediastinum/Nodes: No pathologically enlarged mediastinal or hilar lymph nodes. Esophagus is unremarkable in appearance. No axillary lymphadenopathy. Lungs/Pleura: Multiple nodular and mass-like areas of architectural distortion are noted in the anterior aspect of the right upper lobe, largest of which (axial image 87 of series 7) is estimated to measure approximately 4.4 x 3.6 cm and has macrolobulated and slightly spiculated margins, concerning for neoplasm. Dependent areas of atelectasis are also noted in the right lung. Consolidative changes are also noted in the base of the right lower lobe. Left lung appears clear. Large right pleural effusion lying dependently. Upper Abdomen: Visualized portions of the liver have a shrunken appearance and nodular contour, suggesting underlying cirrhosis. Musculoskeletal: There are no aggressive appearing lytic or blastic lesions noted in the visualized portions of the skeleton. Review of the MIP images confirms the above findings. IMPRESSION: 1. Study is positive for a small subsegmental sized embolus to the left upper lobe, as above. 2. Importantly, however, there are nodular and mass-like regions in the right lung, most notable for a 4.4 x 3.6 cm mass in the anterior aspect of the right upper lobe concerning for potential neoplasm. Associated with this is a large right pleural effusion which may be malignant. Further clinical evaluation and consideration for follow-up PET-CT in the near future is strongly recommended to better evaluate these findings. 3. This right pleural effusion is associated with considerable areas of  atelectasis in the right lung, as well as some consolidative changes in  the basal segments of the right lower lobe concerning for pneumonia. 4. Aortic atherosclerosis, in addition to left main and three-vessel coronary artery disease. Please note that although the presence of coronary artery calcium documents the presence of coronary artery disease, the severity of this disease and any potential stenosis cannot be assessed on this non-gated CT examination. Assessment for potential risk factor modification, dietary therapy or pharmacologic therapy may be warranted, if clinically indicated. 5. There are calcifications of the aortic valve. Echocardiographic correlation for evaluation of potential valvular dysfunction may be warranted if clinically indicated. Aortic Atherosclerosis (ICD10-I70.0). Electronically Signed   By: Trudie Reed M.D.   On: 12/15/2022 16:01   DG Chest 2 View  Result Date: 12/15/2022 CLINICAL DATA:  Shortness of breath. EXAM: CHEST - 2 VIEW COMPARISON:  None Available. FINDINGS: The heart size appears enlarged. There is obscuration of the right cardiac silhouette. Moderate-sized right pleural effusion with associated basilar atelectasis. Bilateral central perihilar prominence. No pneumothorax. No acute osseous abnormality. IMPRESSION: 1. Moderate-sized right pleural effusion with associated basilar atelectasis. 2. Patchy right middle and lower lung zone opacities may represent an infectious/inflammatory etiology. Electronically Signed   By: Hart Robinsons M.D.   On: 12/15/2022 15:27     Michaelyn Barter, MD   01/01/2023 3:40 PM

## 2023-01-01 NOTE — Plan of Care (Signed)
  Problem: Respiratory: Goal: Will maintain a patent airway Outcome: Progressing   

## 2023-01-01 NOTE — Progress Notes (Signed)
PT Cancellation Note  Patient Details Name: Erik Hendricks MRN: [id number] DOB: [birth year]   Cancelled Treatment:    Reason Eval/Treat Not Completed: Patient not medically ready. PT session on hold, due to change in status will discontinue current orders. Will need new orders to re-attempt evaluation at a later date.   Erik Hendricks SPT, LAT, ATC  Erik Hendricks 01/01/2023, 9:02 AM

## 2023-01-01 NOTE — Procedures (Addendum)
PleurX Insertion Procedure Note  Erik Hendricks  [id number]  22-Jun-1940  Date:01/01/23  Time:11:44 AM   Provider Performing:Jean-Pierre Nico Rogness  Procedure: Right PleurX Tunneled Pleural Catheter Placement (32550)  Indication(s) Relief of dyspnea from recurrent effusion  Consent Risks of the procedure as well as the alternatives and risks of each were explained to the patient and/or caregiver.  Consent for the procedure was obtained.  Anesthesia Topical only with 1% lidocaine   Time Out Verified patient identification, verified procedure, site/side was marked, verified correct patient position, special equipment/implants available, medications/allergies/relevant history reviewed, required imaging and test results available.  Sterile Technique Maximal sterile technique including sterile barrier drape, hand hygiene, sterile gown, sterile gloves, mask, hair covering.  Procedure Description Ultrasound used to identify appropriate pleural anatomy for placement and overlying skin marked.  Area of drainage cleaned and draped in sterile fashion.   Lidocaine was used to anesthetize the skin and subcutaneous tissue.   1.5 cm incision made overlying fluid and another about 5 cm anterior to this along chest wall.  PleurX catheter inserted in usual sterile fashion using modified seldinger technique.  Interrupted silk sutures placed at catheter insertion and tunneling points which will be removed at later date.  PleurX catheter then hooked to suction.  After fluid aspirated, pleurX capped and sterile dressing applied.  Complications/Tolerance None; patient tolerated the procedure well. Chest X-ray is ordered to confirm no post-procedural complication.  EBL Minimal  Specimen(s) none  Recommendations: Ok to restart eliquis 5mg  BID in 24 hrs.   [name], MD [facility] 01/01/2023 11:45 AM

## 2023-01-01 NOTE — Progress Notes (Signed)
Spoke with patients family this am.  They said that Palliative Care will be coming in today and requested to also speak with Hospice today also.  They would like to take him home. Sent message to Palliative Care

## 2023-01-01 NOTE — Consult Note (Signed)
[facility]  CONSULT NOTE    Date: 01/01/2023                  Patient Name:  Erik Hendricks  MRN: [id number]  DOB: 1940-04-14  Age / Sex: 82 y.o., male         PCP: [name], MD                 Service Requesting Consult: TRH                 Reason for Consult: Acute kidney injury            History of Present Illness: [name] is a 82 y.o.  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  Once labs obtained, patient found to have sodium 126 and creatinine 1.53.  Patient seen resting in bed after Pleurx catheter placement.  Wife at bedside.  Wife states 5 weeks ago patient was in normal state of health.  Reports patient declined over the past 3 weeks.  Denies NSAID use.  Poor oral intake due to nausea.  Wife states that patient does not want to be "poked and prodded any further".  They have decided to make patient comfort only.   Medications: Outpatient medications: Medications Prior to Admission  Medication Sig Dispense Refill Last Dose   Alpha-Lipoic Acid 600 MG CAPS Take by mouth.      apixaban (ELIQUIS) 5 MG TABS tablet Take 1 tablet (5 mg total) by mouth 2 (two) times daily.      balsalazide (COLAZAL) 750 MG capsule Take 1,500 mg by mouth 2 (two) times daily.      Cholecalciferol (VITAMIN D-1000 MAX ST) 25 MCG (1000 UT) tablet Take by mouth.      DULoxetine (CYMBALTA) 60 MG capsule Take 60 mg by mouth daily.      feeding supplement (ENSURE ENLIVE / ENSURE PLUS) LIQD Take 237 mLs by mouth 3 (three) times daily between meals. 21330 mL 0    fluticasone (FLONASE) 50 MCG/ACT nasal spray Place 1 spray into both nostrils daily.      glipiZIDE (GLUCOTROL XL) 5 MG 24 hr tablet  Take 1 tablet by mouth daily.      metoprolol tartrate (LOPRESSOR) 50 MG tablet Take 50 mg by mouth 2 (two) times daily.      niacin (NIASPAN) 500 MG CR tablet Take 500 mg by mouth daily.       pregabalin (LYRICA) 150 MG capsule Take 150 mg by mouth 2 (two) times daily.      simvastatin (ZOCOR) 20 MG tablet Take 20 mg by mouth daily.        Current medications: Current Facility-Administered Medications  Medication Dose Route Frequency Provider Last Rate Last Admin   acetaminophen (TYLENOL) tablet 650 mg  650 mg Oral Q6H PRN Wouk, Wilfred Curtis, MD       Or   acetaminophen (TYLENOL) suppository 650 mg  650 mg Rectal Q6H PRN Wouk, Wilfred Curtis, MD       glycopyrrolate (ROBINUL) tablet 1 mg  1 mg Oral Q4H PRN Wouk, Wilfred Curtis, MD       Or   glycopyrrolate (ROBINUL) injection 0.2 mg  0.2 mg Subcutaneous Q4H PRN Wouk, Orrick,  MD       Or   glycopyrrolate (ROBINUL) injection 0.2 mg  0.2 mg Intravenous Q4H PRN Wouk, Wilfred Curtis, MD       haloperidol lactate (HALDOL) injection 2.5-5 mg  2.5-5 mg Intravenous Q4H PRN Wouk, Wilfred Curtis, MD       midazolam (VERSED) injection 2-4 mg  2-4 mg Intravenous Q4H PRN Wouk, Wilfred Curtis, MD       morphine (PF) 2 MG/ML injection 2-4 mg  2-4 mg Intravenous Q30 min PRN Wouk, Wilfred Curtis, MD       ondansetron (ZOFRAN-ODT) disintegrating tablet 4 mg  4 mg Oral Q6H PRN Wouk, Wilfred Curtis, MD       Or   ondansetron [facility]) injection 4 mg  4 mg Intravenous Q6H PRN Wouk, Wilfred Curtis, MD       polyvinyl alcohol (LIQUIFILM TEARS) 1.4 % ophthalmic solution 1 drop  1 drop Both Eyes QID PRN Wouk, Wilfred Curtis, MD       sodium chloride flush (NS) 0.9 % injection 10 mL  10 mL Intravenous Q12H Wouk, Wilfred Curtis, MD          Allergies: Allergies  Allergen Reactions   Ciprofloxacin Nausea Only      Past Medical History: Past Medical History:  Diagnosis Date   A-fib [facility])    Anal fissure    Colon polyp    Hemorrhoid    Hypertension    Murmur     Rectal abscess      Past Surgical History: Past Surgical History:  Procedure Laterality Date   ANAL FISTULECTOMY  September 2013   Posterior subcutaneous fistula treated by fistulotomy.   COLONOSCOPY  2007, 2012   Dr Lemar Livings   COLONOSCOPY WITH PROPOFOL N/A 06/21/2018   Procedure: COLONOSCOPY WITH PROPOFOL;  Surgeon: Christena Deem, MD;  Location: [facility] ENDOSCOPY;  Service: Endoscopy;  Laterality: N/A;   FISSURECTOMY     40 yrs ago   KNEE SURGERY Left 2005   POLYPECTOMY  2007   RECTAL SURGERY  40 yrs ago   cyst     Family History: Family History  Problem Relation Age of Onset   Heart Problems Father    Prostate cancer Neg Hx    Bladder Cancer Neg Hx      Social History: Social History   Socioeconomic History   Marital status: Married    Spouse name: Not on file   Number of children: Not on file   Years of education: Not on file   Highest education level: Not on file  Occupational History   Not on file  Tobacco Use   Smoking status: Former    Types: Cigars   Smokeless tobacco: Never   Tobacco comments:    Quit over 50 years ago. Smoked 2-3 a week.  Substance and Sexual Activity   Alcohol use: No   Drug use: No   Sexual activity: Not on file  Other Topics Concern   Not on file  Social History Narrative   Not on file   Social Determinants of Health   Financial Resource Strain: Patient Declined (08/08/2022)   [facility], [facility]   Overall Financial Resource Strain (CARDIA)    Difficulty of Paying Living Expenses: Patient declined  Food Insecurity: No Food Insecurity (12/29/2022)   Hunger Vital Sign    Worried About Running Out of Food in the Last Year: Never true    Ran Out of Food in the Last  Year: Never true  Transportation Needs: No Transportation Needs (12/29/2022)   PRAPARE - Administrator, Civil Service (Medical): No    Lack of Transportation (Non-Medical): No  Physical Activity:  Not on file  Stress: Not on file  Social Connections: Not on file  Intimate Partner Violence: Not At Risk (12/29/2022)   Humiliation, Afraid, Rape, and Kick questionnaire    Fear of Current or Ex-Partner: No    Emotionally Abused: No    Physically Abused: No    Sexually Abused: No    Vital Signs: Blood pressure 125/73, pulse 75, temperature (!) 97.5 F (36.4 C), temperature source Rectal, resp. rate 14, height 6' (1.829 m), weight 113.5 kg, SpO2 93%.  Weight trends: Filed Weights   12/29/22 2100 12/30/22 0500 12/31/22 0500  Weight: 109.8 kg 108.4 kg 113.5 kg    Physical Exam: General: Ill-appearing  Head: Normocephalic, atraumatic.   Eyes: Anicteric,  Neck: Supple  Lungs:  Clear to auscultation  Heart: Regular rate and rhythm  Abdomen:  Soft, nontender, mild distention  Extremities: Trace not peripheral edema.  Neurologic: Somnolent, moving all four extremities  Skin: No lesions  Access: None     Lab results: Basic Metabolic Panel: Recent Labs  Lab 12/30/22 0520 12/31/22 0435 01/01/23 0644  NA 127* 127* 129*  K 4.7 5.1 5.3*  CL 90* 94* 95*  CO2 24 21* 16*  GLUCOSE 107* 111* 85  BUN 57* 69* 85*  CREATININE 1.41* 2.28* 3.41*  CALCIUM 8.8* 8.6* 8.4*    Liver Function Tests: Recent Labs  Lab 12/30/22 0520 12/31/22 0435 01/01/23 0644  AST 133* 319* 608*  ALT 70* 90* 122*  ALKPHOS 308* 308* 254*  BILITOT 1.7* 2.3* 2.9*  PROT 6.1* 6.4* 5.6*  ALBUMIN 3.2* 3.3* 3.1*   No results for input(s): "LIPASE", "AMYLASE" in the last 168 hours. No results for input(s): "AMMONIA" in the last 168 hours.  CBC: Recent Labs  Lab 12/29/22 2127 12/30/22 0520 12/31/22 0435 01/01/23 0644  WBC 8.3 8.2 9.2 10.4  NEUTROABS 6.9  --   --   --   HGB 13.5 14.0 13.9 14.0  HCT 40.6 42.2 42.1 42.1  MCV 85.3 85.6 85.6 85.9  PLT 272 278 282 280    Cardiac Enzymes: No results for input(s): "CKTOTAL", "CKMB", "CKMBINDEX", "TROPONINI" in the last 168 hours.  BNP: Invalid  input(s): "POCBNP"  CBG: Recent Labs  Lab 12/31/22 1640 12/31/22 2135 01/01/23 0839 01/01/23 1005 01/01/23 1204  GLUCAP 94 104* 73 100* 101*    Microbiology: Results for orders placed or performed during the hospital encounter of 12/19/22  Blood Culture (routine x 2)     Status: None   Collection Time: 12/19/22  6:16 PM   Specimen: BLOOD  Result Value Ref Range Status   Specimen Description BLOOD BLOOD LEFT ARM  Final   Special Requests   Final    BOTTLES DRAWN AEROBIC AND ANAEROBIC Blood Culture results may not be optimal due to an excessive volume of blood received in culture bottles   Culture   Final    NO GROWTH 5 DAYS Performed at [facility], [address], [place], [place] [address]    Report Status 12/24/2022 FINAL  Final  Blood Culture (routine x 2)     Status: None   Collection Time: 12/19/22  6:21 PM   Specimen: BLOOD  Result Value Ref Range Status   Specimen Description BLOOD BLOOD RIGHT ARM  Final   Special Requests  Final    BOTTLES DRAWN AEROBIC AND ANAEROBIC Blood Culture results may not be optimal due to an inadequate volume of blood received in culture bottles   Culture   Final    NO GROWTH 5 DAYS Performed at [facility], [address] [place], [place] [address]    Report Status 12/24/2022 FINAL  Final  Resp panel by RT-PCR (RSV, Flu A&B, Covid) Anterior Nasal Swab     Status: Abnormal   Collection Time: 12/19/22  6:40 PM   Specimen: Anterior Nasal Swab  Result Value Ref Range Status   SARS Coronavirus 2 by RT PCR POSITIVE (A) NEGATIVE Final    Comment: (NOTE) SARS-CoV-2 target nucleic acids are DETECTED.  The SARS-CoV-2 RNA is generally detectable in upper respiratory specimens during the acute phase of infection. Positive results are indicative of the presence of the identified virus, but do not rule out bacterial infection or co-infection with other pathogens not detected by the test. Clinical correlation with patient  history and other diagnostic information is necessary to determine patient infection status. The expected result is Negative.  Fact Sheet for Patients: BloggerCourse.com  Fact Sheet for Healthcare Providers: SeriousBroker.it  This test is not yet approved or cleared by the [place] FDA and  has been authorized for detection and/or diagnosis of SARS-CoV-2 by FDA under an Emergency Use Authorization (EUA).  This EUA will remain in effect (meaning this test can be used) for the duration of  the COVID-19 declaration under Section 564(b)(1) of the A ct, 21 U.S.C. section 360bbb-3(b)(1), unless the authorization is terminated or revoked sooner.     Influenza A by PCR NEGATIVE NEGATIVE Final   Influenza B by PCR NEGATIVE NEGATIVE Final    Comment: (NOTE) The Xpert Xpress SARS-CoV-2/FLU/RSV plus assay is intended as an aid in the diagnosis of influenza from Nasopharyngeal swab specimens and should not be used as a sole basis for treatment. Nasal washings and aspirates are unacceptable for Xpert Xpress SARS-CoV-2/FLU/RSV testing.  Fact Sheet for Patients: BloggerCourse.com  Fact Sheet for Healthcare Providers: SeriousBroker.it  This test is not yet approved or cleared by the [place] FDA and has been authorized for detection and/or diagnosis of SARS-CoV-2 by FDA under an Emergency Use Authorization (EUA). This EUA will remain in effect (meaning this test can be used) for the duration of the COVID-19 declaration under Section 564(b)(1) of the Act, 21 U.S.C. section 360bbb-3(b)(1), unless the authorization is terminated or revoked.     Resp Syncytial Virus by PCR NEGATIVE NEGATIVE Final    Comment: (NOTE) Fact Sheet for Patients: BloggerCourse.com  Fact Sheet for Healthcare Providers: SeriousBroker.it  This test is not yet  approved or cleared by the [place] FDA and has been authorized for detection and/or diagnosis of SARS-CoV-2 by FDA under an Emergency Use Authorization (EUA). This EUA will remain in effect (meaning this test can be used) for the duration of the COVID-19 declaration under Section 564(b)(1) of the Act, 21 U.S.C. section 360bbb-3(b)(1), unless the authorization is terminated or revoked.  Performed at [facility], [address] [place], [place] [address]   Body fluid culture w Gram Stain     Status: None   Collection Time: 12/21/22  4:37 PM   Specimen: Pleura  Result Value Ref Range Status   Specimen Description   Final    PLEURAL Performed at [facility], [address], [place], [place] [address]    Special Requests   Final  NONE Performed at [facility], [address] [place], [place] [address]    Gram Stain NO WBC SEEN NO ORGANISMS SEEN   Final   Culture   Final    NO GROWTH 3 DAYS Performed at [facility], [address] [place], [place] [address]    Report Status 12/25/2022 FINAL  Final    Coagulation Studies: Recent Labs    12/29/22 February 09, 2132  LABPROT 27.7*  INR 2.6*    Urinalysis: No results for input(s): "COLORURINE", "LABSPEC", "PHURINE", "GLUCOSEU", "HGBUR", "BILIRUBINUR", "KETONESUR", "PROTEINUR", "UROBILINOGEN", "NITRITE", "LEUKOCYTESUR" in the last 72 hours.  Invalid input(s): "APPERANCEUR"    Imaging: ECHOCARDIOGRAM COMPLETE  Result Date: 01/01/2023    ECHOCARDIOGRAM REPORT   Patient Name:   Erik Hendricks Date of Exam: 01/01/2023 Medical Rec #:  [id number]       Height:       72.0 in Accession #:    [id number]      Weight:       250.2 lb Date of Birth:  [birth year]       BSA:          2.343 m Patient Age:    82 years        BP:           99/80 mmHg Patient Gender: M               HR:           83 bpm. Exam Location:  [facility] Procedure: 2D Echo, Cardiac Doppler and Color Doppler STAT ECHO Indications:      Pericardial Effusion I31.3  History:         Patient has no prior history of Echocardiogram examinations.                  Arrythmias:Atrial Fibrillation, Signs/Symptoms:Murmur; Risk                  Factors:Hypertension.  Sonographer:     Cristela Blue Referring Phys:  [id number] [facility] BEDF[facility] WOUK Diagnosing Phys: Rozell Searing Custovic IMPRESSIONS  1. Left ventricular ejection fraction, by estimation, is 55 to 60%. Left ventricular ejection fraction by PLAX is 58 %. The left ventricle has normal function. The left ventricle has no regional wall motion abnormalities. Left ventricular diastolic parameters are consistent with Grade II diastolic dysfunction (pseudonormalization).  2. Right ventricular systolic function is normal. The right ventricular size is moderately enlarged. There is normal pulmonary artery systolic pressure. The estimated right ventricular systolic pressure is 25.5 mmHg.  3. Large pericardial effusion. The pericardial effusion is circumferential. There is no evidence of cardiac tamponade.  4. The mitral valve is grossly normal. Trivial mitral valve regurgitation.  5. The aortic valve is calcified. Aortic valve regurgitation is not visualized. Aortic valve sclerosis/calcification is present, without any evidence of aortic stenosis.  6. Aortic dilatation noted. FINDINGS  Left Ventricle: Left ventricular ejection fraction, by estimation, is 55 to 60%. Left ventricular ejection fraction by PLAX is 58 %. The left ventricle has normal function. The left ventricle has no regional wall motion abnormalities. The left ventricular internal cavity size was normal in size. There is no left ventricular hypertrophy. Left ventricular diastolic parameters are consistent with Grade II diastolic dysfunction (pseudonormalization). Right Ventricle: The right ventricular size is moderately enlarged. No increase in right ventricular wall thickness. Right ventricular systolic function is normal. There is normal pulmonary artery  systolic pressure. The tricuspid regurgitant velocity is 1.62 m/s, and with an assumed  right atrial pressure of 15 mmHg, the estimated right ventricular systolic pressure is 25.5 mmHg. Left Atrium: Left atrial size was normal in size. Right Atrium: Right atrial size was normal in size. Pericardium: A large pericardial effusion is present. The pericardial effusion is circumferential. There is diastolic collapse of the right atrial wall and diastolic collapse of the right ventricular free wall. There is no evidence of cardiac tamponade. Mitral Valve: The mitral valve is grossly normal. Trivial mitral valve regurgitation. Tricuspid Valve: The tricuspid valve is grossly normal. Tricuspid valve regurgitation is trivial. Aortic Valve: The aortic valve is calcified. Aortic valve regurgitation is not visualized. Aortic valve sclerosis/calcification is present, without any evidence of aortic stenosis. Aortic valve mean gradient measures 1.5 mmHg. Aortic valve peak gradient measures 2.9 mmHg. Aortic valve area, by VTI measures 2.51 cm. Pulmonic Valve: The pulmonic valve was grossly normal. Pulmonic valve regurgitation is not visualized. Aorta: Aortic dilatation noted. IAS/Shunts: No atrial level shunt detected by color flow Doppler.  LEFT VENTRICLE PLAX 2D LV EF:         Left            Diastology                ventricular     LV e' medial:    4.79 cm/s                ejection        LV E/e' medial:  14.4                fraction by     LV e' lateral:   4.57 cm/s                PLAX is 58      LV E/e' lateral: 15.1                %. LVIDd:         3.70 cm LVIDs:         2.60 cm LV PW:         0.90 cm LV IVS:        1.00 cm LVOT diam:     2.00 cm LV SV:         28 LV SV Index:   12 LVOT Area:     3.14 cm  RIGHT VENTRICLE RV Basal diam:  4.00 cm RV Mid diam:    2.70 cm LEFT ATRIUM             Index        RIGHT ATRIUM           Index LA diam:        4.40 cm 1.88 cm/m   RA Area:     12.70 cm LA Vol (A2C):   48.3 ml 20.61  ml/m  RA Volume:   33.20 ml  14.17 ml/m LA Vol (A4C):   44.7 ml 19.07 ml/m LA Biplane Vol: 47.7 ml 20.36 ml/m  AORTIC VALVE AV Area (Vmax):    1.84 cm AV Area (Vmean):   2.04 cm AV Area (VTI):     2.51 cm AV Vmax:           85.65 cm/s AV Vmean:          56.950 cm/s AV VTI:            0.110 m AV Peak Grad:      2.9 mmHg AV Mean Grad:  1.5 mmHg LVOT Vmax:         50.30 cm/s LVOT Vmean:        37.000 cm/s LVOT VTI:          0.088 m LVOT/AV VTI ratio: 0.80  AORTA Ao Root diam: 4.07 cm MITRAL VALVE               TRICUSPID VALVE MV Area (PHT): 3.68 cm    TR Peak grad:   10.5 mmHg MV Decel Time: 206 msec    TR Vmax:        162.00 cm/s MV E velocity: 69.20 cm/s                            SHUNTS                            Systemic VTI:  0.09 m                            Systemic Diam: 2.00 cm Rozell Searing Custovic Electronically signed by Clotilde Dieter Signature Date/Time: 01/01/2023/12:44:45 PM    Final    CT ABDOMEN PELVIS WO CONTRAST  Result Date: 12/31/2022 CLINICAL DATA:  Metastatic salivary gland tumor. Acute kidney injury. Transaminitis. * Tracking Code: BO * EXAM: CT ABDOMEN AND PELVIS WITHOUT CONTRAST TECHNIQUE: Multidetector CT imaging of the abdomen and pelvis was performed following the standard protocol without IV contrast. RADIATION DOSE REDUCTION: This exam was performed according to the departmental dose-optimization program which includes automated exposure control, adjustment of the mA and/or kV according to patient size and/or use of iterative reconstruction technique. COMPARISON:  Abdomen and pelvis CT 07/25/2011 FINDINGS: Lower chest: Moderate to large right pleural effusion incompletely visualized. There is collapse/consolidation of the right lower lobe. Moderate pericardial effusion measuring up to 2.2 cm in thickness. Density of the pericardial fluid is too high to be simple fluid and may be proteinaceous or hemorrhagic in content. Hepatobiliary: No suspicious focal abnormality in the  liver on this study without intravenous contrast. There is no evidence for gallstones, gallbladder wall thickening, or pericholecystic fluid. Increased attenuation of gallbladder fluid may be due to sludge. No intrahepatic or extrahepatic biliary dilation. Pancreas: No focal mass lesion. No dilatation of the main duct. No intraparenchymal cyst. No peripancreatic edema. Spleen: No splenomegaly. No suspicious focal mass lesion. Adrenals/Urinary Tract: No adrenal nodule or mass. Small cyst noted lower pole right kidney. Exophytic 3.2 cm cyst noted lower pole left kidney. No followup imaging is recommended. No hydronephrosis or hydroureter. The urinary bladder appears normal for the degree of distention. Stomach/Bowel: Stomach is distended with gas and fluid. Duodenum is normally positioned as is the ligament of Treitz. Duodenal diverticulum noted. No small bowel wall thickening. No small bowel dilatation. The terminal ileum is normal. The appendix is not well visualized, but there is no edema or inflammation in the region of the cecal tip to suggest appendicitis. No gross colonic mass. No colonic wall thickening. Diverticuli are seen scattered along the entire length of the colon without CT findings of diverticulitis. Fluid is seen in the rectum, a finding that can be associated with clinical diarrhea. Vascular/Lymphatic: There is moderate atherosclerotic calcification of the abdominal aorta without aneurysm. There is no gastrohepatic or hepatoduodenal ligament lymphadenopathy. No retroperitoneal or mesenteric lymphadenopathy. No pelvic sidewall lymphadenopathy. Reproductive: Prostate gland is enlarged. Other: Left groin hernia  contains fat and a benign appearing calcification. Edema or hemorrhage is seen in the extraperitoneal left pelvic sidewall. Given the absence of a trauma history, urine leak from the left ureter is considered unlikely. There is a small volume of fluid adjacent to the liver and in both pericolic  gutters. Musculoskeletal: Diffuse body wall edema evident. No worrisome lytic or sclerotic osseous abnormality. IMPRESSION: 1. Moderate to large right pleural effusion incompletely visualized. There is collapse/consolidation of the right lower lobe. 2. Moderate pericardial effusion measuring up to 2.2 cm in thickness. Density of the pericardial fluid is too high to be simple fluid compatible with proteinaceous or hemorrhagic pericardial fluid. 3. Edema or hemorrhage in the extraperitoneal left pelvic sidewall. This is of indeterminate etiology. Trace retroperitoneal hemorrhage could have this appearance especially if the patient has had recent left groin vascular access procedure. Given the absence of a trauma history, urine leak from the left ureter is considered unlikely. 4. Small volume of fluid adjacent to the liver and in both para colic gutters. 5. 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 6. Fluid in the rectum, a finding that can be associated with clinical diarrhea. 7. Left groin hernia contains fat and a benign appearing calcification. 8. Diffuse body wall edema. 9.  Aortic Atherosclerosis (ICD10-I70.0). These results will be called to the ordering clinician or representative by the Radiologist Assistant, and communication documented in the PACS or [employer]. Electronically Signed   By: Kennith Center M.D.   On: 12/31/2022 13:55   DG Chest Port 1 View  Result Date: 12/31/2022 CLINICAL DATA:  Tachycardia. EXAM: PORTABLE CHEST 1 VIEW COMPARISON:  12/26/2022 FINDINGS: Interval progression of patchy opacity in the right lung with increasing right pleural effusion. Left lung remains relatively clear. The cardio pericardial silhouette is enlarged. No acute bony abnormality. Telemetry leads overlie the chest. IMPRESSION: Interval progression of patchy opacity in the right lung with increasing right pleural effusion. Patient has known metastatic high-grade salivary duct carcinoma to the  right hemithorax. Electronically Signed   By: Kennith Center M.D.   On: 12/31/2022 06:39     Assessment & Plan: [name] is a 82 y.o.  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  Baseline creatinine appears to be 0.77 on 12/22/2022 imaging negative for hydronephrosis. No IV contrast exposure.   2. Hyponatremia/hyperkalemia likely secondary to kidney injury and increased volume status. Will correct with IV hydration.   3. Acute metabolic acidosis, s bicarb 16 today. Continue sodium bicarb infusion.   While speaking with wife, she states patient will transition to comfort care. Will sign off at this time. Thank you for consult.     LOS: 2 [place] Jeff 11/11/20244:17 PM

## 2023-01-01 NOTE — Plan of Care (Signed)
  Problem: Coping: Goal: Psychosocial and spiritual needs will be supported Outcome: Progressing   

## 2023-01-01 NOTE — Progress Notes (Signed)
PHARMACY - ANTICOAGULATION CONSULT NOTE  Pharmacy Consult for Heparin Infusion Indication: pulmonary embolus  Allergies  Allergen Reactions   Ciprofloxacin Nausea Only    Patient Measurements: Height: 6' (182.9 cm) Weight: 113.5 kg (250 lb 3.6 oz) IBW/kg (Calculated) : 77.6 Heparin Dosing Weight: 100.9 kg  Vital Signs: Temp: 97.1 F (36.2 C) (11/10 2322) Temp Source: Rectal (11/10 2322) BP: 93/66 (11/10 2322) Pulse Rate: 103 (11/10 2322)  Labs: Recent Labs    12/29/22 2127 12/29/22 2133 12/30/22 0520 12/30/22 1644 12/31/22 0435 12/31/22 1504 12/31/22 2355  HGB 13.5  --  14.0  --  13.9  --   --   HCT 40.6  --  42.2  --  42.1  --   --   PLT 272  --  278  --  282  --   --   APTT 52*  --  >200*   < > >200* 105* 92*  LABPROT  --  27.7*  --   --   --   --   --   INR  --  2.6*  --   --   --   --   --   HEPARINUNFRC  --  >1.10*  --   --  >1.10*  --   --   CREATININE 1.53*  --  1.41*  --  2.28*  --   --    < > = values in this interval not displayed.    Estimated Creatinine Clearance: 33.6 mL/min (A) (by C-G formula based on SCr of 2.28 mg/dL (H)).   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 He presented to [facility] with recurrent right pleural effusion and was noted to have a pulmonary embolism. Pharmacy was consulted to initiate patient on a heparin infusion.  Of note, patient does take apixaban at home, with his last dose being 11/8 AM.  Baseline heparin level, INR, and aPTT ordered.  No  signs/symptoms of bleeding noted in chart. Baseline CBC ordered.  11/9 0520  aPTT>200, SUPRAtherapeutic  1600>1400 11/10 0453 aPTT > 200,  HL > 1.10  11/10 1503 aPTT 105, SUPRAtherapeutic 11/10 2355 aPTT  92,  therapeutic X 1   Goal of Therapy:  Heparin level 0.3-0.7 units/ml aPTT 66-102 seconds Monitor platelets by anticoagulation protocol: Yes   Plan:  11/10:  aPTT = 92, therapeutic X 1  - heparin infusion will be stopped at 11/11 0400 for palliative Pleurx placement; follow for restart post-procedure - will recheck HL and aPTT 8 hrs after restart  - Monitor CBC and heparin level with AM labs    Mozes Sagar D, PharmD Clinical Pharmacist  01/01/2023 12:30 AM

## 2023-01-02 DIAGNOSIS — J90 Pleural effusion, not elsewhere classified: Secondary | ICD-10-CM | POA: Diagnosis not present

## 2023-01-02 NOTE — Care Management Important Message (Signed)
Important Message  Patient Details  Name: Erik Hendricks MRN: [id number] Date of Birth: Jun 23, 1940   Important Message Given:  Other (see comment)  Patient is on comfort care and discharging with hospice. Out of respect for the patient and family no Important Message from [facility] given.    Olegario Messier A Deneisha Dade 01/02/2023, 11:13 AM

## 2023-01-02 NOTE — Progress Notes (Signed)
Report called to Mindy at [facility], all belongings sent with patient and family. All discharge instructions provided and questions answered. IV remained in place to be used at hospice.

## 2023-01-02 NOTE — Progress Notes (Signed)
   [facility]- [employer], [employer]    Received request from Transitions of Care Manger for family interest in [facility].  Eligibility has been confirmed.  Met with family to confirm interest and explain services.  Family agreeable to transfer today.  Transitions of Care manager aware.  RN please call report to [phone] [facility]) prior to patient leaving the unit.  Please send signed and completed DNR with patient at discharge.  Thank you  Redge Gainer,  [facility] Liaison [phone]

## 2023-01-02 NOTE — Discharge Summary (Signed)
Erik Hendricks TFT:[id number] DOB: Dec 26, 1940 DOA: 12/29/2022  PCP: [name], MD  Admit date: 12/29/2022 Discharge date: 01/02/2023  Time spent: 35 minutes    Discharge Diagnoses:  Principal Problem:   Pleural effusion on right Active Problems:   Pulmonary embolism (HCC)   Coronary artery disease   CVA (cerebral vascular accident) (HCC)   HTN (hypertension)   Salivary gland carcinoma (HCC)   Type 2 diabetes mellitus with hyperglycemia, without long-term current use of insulin (HCC)   Chronic diastolic CHF (congestive heart failure) (HCC)   Obesity (BMI 30-39.9)   COVID-19 virus infection   A-fib (HCC)   Malignant pleural effusion   Pleural effusion   AKI (acute kidney injury) (HCC)   Transaminitis   Discharge Condition: stable  Diet recommendation: ad lib  Filed Weights   12/29/22 2100 12/30/22 0500 12/31/22 0500  Weight: 109.8 kg 108.4 kg 113.5 kg    History of present illness:  From admission h and p Erik Hendricks is a 82 y.o. male with medical history significant for pulmonary embolism on Eliquis, recurrent metastatic high-grade salivary duct cancer, status post parotidectomy, left neck level dissection and reconstruction with left cervical facial advancement flap referred by oncology for recurrent malignant right-sided pleural effusion.  Per report patient has had significant declining functional status the past few weeks, patient initially declined Pleurx catheter.  Patient was a direct admit for initiation of heparin drip coming off of Eliquis and to get a Pleurx catheter on Monday per wife and daughter at bedside.   Hospital Course:  # Malignant pleural effusion Has had several thoracenteses this past month. Stable on 2 liters o2 but hypoxic with minimal ambulation. Pleurx catheter placed here, can now be used as needed for palliation.   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 Has established with oncology  Alena Bills) outpt. Prognosis is poor particularly in light of significant functional decline and now multi-organ failure. After meeting with oncology 11/11 family has elected to pursue full comfort measures, discharged to inpatient hospice.   # AKI # Metabolic acidosis Possibly prerenal from decreased po but cr has worsened to 3s despite resuscitation, no signs obstruction on CT. Nephrology consulted but further evaluation discontinued when transitioned to comfort care   # Liver failure Worsening LFTs despite fluids, etiology unclear, further w/u declined after transition to comfort care   # PE Recent, likely provoked by malignancy   # A-fib with rvr Required up-titration of beta blocker   # Pericardial effusion Large, likely 2/2 malignancy, no tamponade physiology on TTE   # Chronic pain   # T2DM   # CAD   # HFpEF    Procedures: Pleurx catheter placement    Consultations: Pulm, oncology, cardiology, nephrology  Discharge Exam: Vitals:   01/02/23 0609 01/02/23 1126  BP: 95/73 (!) 88/69  Pulse: 71 (!) 49  Resp: 18 20  Temp: (!) 96.1 F (35.6 C)   SpO2: 95% 92%    General: NAD, asleep Cardiovascular: RRR Respiratory: normal wob  Discharge Instructions   Discharge Instructions     Diet general   Complete by: As directed    Increase activity slowly   Complete by: As directed    No wound care   Complete by: As directed       Allergies as of 01/02/2023       Reactions   Ciprofloxacin Nausea Only        Medication List     STOP taking these  medications    Alpha-Lipoic Acid 600 MG Caps   apixaban 5 MG Tabs tablet Commonly known as: ELIQUIS   balsalazide 750 MG capsule Commonly known as: COLAZAL   DULoxetine 60 MG capsule Commonly known as: CYMBALTA   feeding supplement Liqd   fluticasone 50 MCG/ACT nasal spray Commonly known as: FLONASE   glipiZIDE 5 MG 24 hr tablet Commonly known as: GLUCOTROL XL   metoprolol tartrate 50 MG  tablet Commonly known as: LOPRESSOR   Niaspan 500 MG ER tablet Generic drug: niacin   pregabalin 150 MG capsule Commonly known as: LYRICA   simvastatin 20 MG tablet Commonly known as: ZOCOR   Vitamin D-1000 Max St 25 MCG (1000 UT) tablet Generic drug: Cholecalciferol       Allergies  Allergen Reactions   Ciprofloxacin Nausea Only      The results of significant diagnostics from this hospitalization (including imaging, microbiology, ancillary and laboratory) are listed below for reference.    Significant Diagnostic Studies: DG Chest Port 1 View  Result Date: 01/01/2023 CLINICAL DATA:  Chest tube placement EXAM: PORTABLE CHEST 1 VIEW COMPARISON:  Chest x-ray 12/31/2022.  Chest CT 12/15/2022. FINDINGS: New right-sided chest tube is in place. There is a small right pleural effusion which has slightly decreased. Multifocal airspace opacities throughout the right lung are similar to prior. Left lung is clear. Cardiomediastinal silhouette is unchanged. No pneumothorax visualized. IMPRESSION: 1. New right-sided chest tube in place. Small right pleural effusion has slightly decreased. 2. Multifocal airspace opacities throughout the right lung are similar to prior. Electronically Signed   By: Darliss Cheney M.D.   On: 01/01/2023 16:25   ECHOCARDIOGRAM COMPLETE  Result Date: 01/01/2023    ECHOCARDIOGRAM REPORT   Patient Name:   Erik Hendricks Date of Exam: 01/01/2023 Medical Rec #:  [id number]       Height:       72.0 in Accession #:    [id number]      Weight:       250.2 lb Date of Birth:  [birth year]       BSA:          2.343 m Patient Age:    82 years        BP:           99/80 mmHg Patient Gender: M               HR:           83 bpm. Exam Location:  [facility] Procedure: 2D Echo, Cardiac Doppler and Color Doppler STAT ECHO Indications:     Pericardial Effusion I31.3  History:         Patient has no prior history of Echocardiogram examinations.                  Arrythmias:Atrial Fibrillation,  Signs/Symptoms:Murmur; Risk                  Factors:Hypertension.  Sonographer:     Cristela Blue Referring Phys:  [id number] [facility] [place] Finlee Concepcion Diagnosing Phys: Rozell Searing Custovic IMPRESSIONS  1. Left ventricular ejection fraction, by estimation, is 55 to 60%. Left ventricular ejection fraction by PLAX is 58 %. The left ventricle has normal function. The left ventricle has no regional wall motion abnormalities. Left ventricular diastolic parameters are consistent with Grade II diastolic dysfunction (pseudonormalization).  2. Right ventricular systolic function is normal. The right ventricular size is moderately enlarged. There is normal pulmonary artery systolic pressure. The estimated right  ventricular systolic pressure is 25.5 mmHg.  3. Large pericardial effusion. The pericardial effusion is circumferential. There is no evidence of cardiac tamponade.  4. The mitral valve is grossly normal. Trivial mitral valve regurgitation.  5. The aortic valve is calcified. Aortic valve regurgitation is not visualized. Aortic valve sclerosis/calcification is present, without any evidence of aortic stenosis.  6. Aortic dilatation noted. FINDINGS  Left Ventricle: Left ventricular ejection fraction, by estimation, is 55 to 60%. Left ventricular ejection fraction by PLAX is 58 %. The left ventricle has normal function. The left ventricle has no regional wall motion abnormalities. The left ventricular internal cavity size was normal in size. There is no left ventricular hypertrophy. Left ventricular diastolic parameters are consistent with Grade II diastolic dysfunction (pseudonormalization). Right Ventricle: The right ventricular size is moderately enlarged. No increase in right ventricular wall thickness. Right ventricular systolic function is normal. There is normal pulmonary artery systolic pressure. The tricuspid regurgitant velocity is 1.62 m/s, and with an assumed right atrial pressure of 15 mmHg, the estimated right ventricular  systolic pressure is 25.5 mmHg. Left Atrium: Left atrial size was normal in size. Right Atrium: Right atrial size was normal in size. Pericardium: A large pericardial effusion is present. The pericardial effusion is circumferential. There is diastolic collapse of the right atrial wall and diastolic collapse of the right ventricular free wall. There is no evidence of cardiac tamponade. Mitral Valve: The mitral valve is grossly normal. Trivial mitral valve regurgitation. Tricuspid Valve: The tricuspid valve is grossly normal. Tricuspid valve regurgitation is trivial. Aortic Valve: The aortic valve is calcified. Aortic valve regurgitation is not visualized. Aortic valve sclerosis/calcification is present, without any evidence of aortic stenosis. Aortic valve mean gradient measures 1.5 mmHg. Aortic valve peak gradient measures 2.9 mmHg. Aortic valve area, by VTI measures 2.51 cm. Pulmonic Valve: The pulmonic valve was grossly normal. Pulmonic valve regurgitation is not visualized. Aorta: Aortic dilatation noted. IAS/Shunts: No atrial level shunt detected by color flow Doppler.  LEFT VENTRICLE PLAX 2D LV EF:         Left            Diastology                ventricular     LV e' medial:    4.79 cm/s                ejection        LV E/e' medial:  14.4                fraction by     LV e' lateral:   4.57 cm/s                PLAX is 58      LV E/e' lateral: 15.1                %. LVIDd:         3.70 cm LVIDs:         2.60 cm LV PW:         0.90 cm LV IVS:        1.00 cm LVOT diam:     2.00 cm LV SV:         28 LV SV Index:   12 LVOT Area:     3.14 cm  RIGHT VENTRICLE RV Basal diam:  4.00 cm RV Mid diam:    2.70 cm LEFT ATRIUM  Index        RIGHT ATRIUM           Index LA diam:        4.40 cm 1.88 cm/m   RA Area:     12.70 cm LA Vol (A2C):   48.3 ml 20.61 ml/m  RA Volume:   33.20 ml  14.17 ml/m LA Vol (A4C):   44.7 ml 19.07 ml/m LA Biplane Vol: 47.7 ml 20.36 ml/m  AORTIC VALVE AV Area (Vmax):    1.84 cm  AV Area (Vmean):   2.04 cm AV Area (VTI):     2.51 cm AV Vmax:           85.65 cm/s AV Vmean:          56.950 cm/s AV VTI:            0.110 m AV Peak Grad:      2.9 mmHg AV Mean Grad:      1.5 mmHg LVOT Vmax:         50.30 cm/s LVOT Vmean:        37.000 cm/s LVOT VTI:          0.088 m LVOT/AV VTI ratio: 0.80  AORTA Ao Root diam: 4.07 cm MITRAL VALVE               TRICUSPID VALVE MV Area (PHT): 3.68 cm    TR Peak grad:   10.5 mmHg MV Decel Time: 206 msec    TR Vmax:        162.00 cm/s MV E velocity: 69.20 cm/s                            SHUNTS                            Systemic VTI:  0.09 m                            Systemic Diam: 2.00 cm Rozell Searing Custovic Electronically signed by Clotilde Dieter Signature Date/Time: 01/01/2023/12:44:45 PM    Final    CT ABDOMEN PELVIS WO CONTRAST  Result Date: 12/31/2022 CLINICAL DATA:  Metastatic salivary gland tumor. Acute kidney injury. Transaminitis. * Tracking Code: BO * EXAM: CT ABDOMEN AND PELVIS WITHOUT CONTRAST TECHNIQUE: Multidetector CT imaging of the abdomen and pelvis was performed following the standard protocol without IV contrast. RADIATION DOSE REDUCTION: This exam was performed according to the departmental dose-optimization program which includes automated exposure control, adjustment of the mA and/or kV according to patient size and/or use of iterative reconstruction technique. COMPARISON:  Abdomen and pelvis CT 07/25/2011 FINDINGS: Lower chest: Moderate to large right pleural effusion incompletely visualized. There is collapse/consolidation of the right lower lobe. Moderate pericardial effusion measuring up to 2.2 cm in thickness. Density of the pericardial fluid is too high to be simple fluid and may be proteinaceous or hemorrhagic in content. Hepatobiliary: No suspicious focal abnormality in the liver on this study without intravenous contrast. There is no evidence for gallstones, gallbladder wall thickening, or pericholecystic fluid. Increased  attenuation of gallbladder fluid may be due to sludge. No intrahepatic or extrahepatic biliary dilation. Pancreas: No focal mass lesion. No dilatation of the main duct. No intraparenchymal cyst. No peripancreatic edema. Spleen: No splenomegaly. No suspicious focal mass lesion. Adrenals/Urinary Tract: No adrenal nodule or mass. Small cyst noted  lower pole right kidney. Exophytic 3.2 cm cyst noted lower pole left kidney. No followup imaging is recommended. No hydronephrosis or hydroureter. The urinary bladder appears normal for the degree of distention. Stomach/Bowel: Stomach is distended with gas and fluid. Duodenum is normally positioned as is the ligament of Treitz. Duodenal diverticulum noted. No small bowel wall thickening. No small bowel dilatation. The terminal ileum is normal. The appendix is not well visualized, but there is no edema or inflammation in the region of the cecal tip to suggest appendicitis. No gross colonic mass. No colonic wall thickening. Diverticuli are seen scattered along the entire length of the colon without CT findings of diverticulitis. Fluid is seen in the rectum, a finding that can be associated with clinical diarrhea. Vascular/Lymphatic: There is moderate atherosclerotic calcification of the abdominal aorta without aneurysm. There is no gastrohepatic or hepatoduodenal ligament lymphadenopathy. No retroperitoneal or mesenteric lymphadenopathy. No pelvic sidewall lymphadenopathy. Reproductive: Prostate gland is enlarged. Other: Left groin hernia contains fat and a benign appearing calcification. Edema or hemorrhage is seen in the extraperitoneal left pelvic sidewall. Given the absence of a trauma history, urine leak from the left ureter is considered unlikely. There is a small volume of fluid adjacent to the liver and in both pericolic gutters. Musculoskeletal: Diffuse body wall edema evident. No worrisome lytic or sclerotic osseous abnormality. IMPRESSION: 1. Moderate to large right  pleural effusion incompletely visualized. There is collapse/consolidation of the right lower lobe. 2. Moderate pericardial effusion measuring up to 2.2 cm in thickness. Density of the pericardial fluid is too high to be simple fluid compatible with proteinaceous or hemorrhagic pericardial fluid. 3. Edema or hemorrhage in the extraperitoneal left pelvic sidewall. This is of indeterminate etiology. Trace retroperitoneal hemorrhage could have this appearance especially if the patient has had recent left groin vascular access procedure. Given the absence of a trauma history, urine leak from the left ureter is considered unlikely. 4. Small volume of fluid adjacent to the liver and in both para colic gutters. 5. 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 6. Fluid in the rectum, a finding that can be associated with clinical diarrhea. 7. Left groin hernia contains fat and a benign appearing calcification. 8. Diffuse body wall edema. 9.  Aortic Atherosclerosis (ICD10-I70.0). These results will be called to the ordering clinician or representative by the Radiologist Assistant, and communication documented in the PACS or [employer]. Electronically Signed   By: Kennith Center M.D.   On: 12/31/2022 13:55   DG Chest Port 1 View  Result Date: 12/31/2022 CLINICAL DATA:  Tachycardia. EXAM: PORTABLE CHEST 1 VIEW COMPARISON:  12/26/2022 FINDINGS: Interval progression of patchy opacity in the right lung with increasing right pleural effusion. Left lung remains relatively clear. The cardio pericardial silhouette is enlarged. No acute bony abnormality. Telemetry leads overlie the chest. IMPRESSION: Interval progression of patchy opacity in the right lung with increasing right pleural effusion. 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 Electronically Signed   By: Kennith Center M.D.   On: 12/31/2022 06:39   US THORACENTESIS ASP PLEURAL SPACE W/IMG GUIDE  Result Date:  12/26/2022 INDICATION: Right Therapeutic Thoracentesis for pleural effusion. Patient has history of right side lung mass EXAM: ULTRASOUND GUIDED RIGHT THORACENTESIS MEDICATIONS: 10 cc's of 1% Lidocaine COMPLICATIONS: None immediate. PROCEDURE: An ultrasound guided thoracentesis was thoroughly discussed with the patient and questions answered. The benefits, risks, alternatives and complications were also discussed. The patient understands and wishes to proceed with the procedure. Written consent  was obtained. Ultrasound was performed to localize and mark an adequate pocket of fluid in the right chest. The area was then prepped and draped in the normal sterile fashion. 1% Lidocaine was used for local anesthesia. Under ultrasound guidance a 8 Fr Safe-T-Centesis catheter was introduced. Thoracentesis was performed. The catheter was removed and a dressing applied. FINDINGS: A total of approximately 1.65 Liters of clear yellow fluid was removed. IMPRESSION: Successful ultrasound guided right thoracentesis yielding 1.65 Liters of pleural fluid. Performed by Ardith Dark, NP-C Electronically Signed   By: Olive Bass M.D.   On: 12/26/2022 11:58   DG Chest Port 1 View  Result Date: 12/26/2022 CLINICAL DATA:  post right thoracentesis EXAM: PORTABLE CHEST 1 VIEW COMPARISON:  Multiple priors FINDINGS: Improved aeration of the right lower lung with residual small to moderate right effusion status post thoracentesis. The heart appears at the upper limit of normal for size. The left lung remains relatively clear. No pneumothorax. IMPRESSION: Improved aeration of the right lower lung with residual small to moderate right effusion. No pneumothorax. Electronically Signed   By: Olive Bass M.D.   On: 12/26/2022 11:33   DG Chest 2 View  Result Date: 12/26/2022 CLINICAL DATA:  shortness of breath EXAM: CHEST - 2 VIEW COMPARISON:  Multiple priors FINDINGS: The heart appears near the upper limit of normal for size. Dense  opacification of the right lower lung. Left lung remains relatively clear. No acute osseous abnormality. IMPRESSION: Dense opacification of the right lower lung, probable combination of at least moderate effusion and associated atelectasis/consolidation. Electronically Signed   By: Olive Bass M.D.   On: 12/26/2022 11:32   DG Chest Port 1 View  Result Date: 12/21/2022 CLINICAL DATA:  Status post thoracentesis. EXAM: PORTABLE CHEST 1 VIEW COMPARISON:  Chest radiograph dated 12/19/2022. FINDINGS: Slight interval decrease in the size of right pleural effusion since the earlier radiograph. Residual small pleural effusion and right lung base atelectasis. Pneumonia is not excluded. The left lung is clear. There is no pneumothorax. Stable cardiac silhouette. No acute osseous pathology. IMPRESSION: Slight interval decrease in the size of right pleural effusion. No pneumothorax. Electronically Signed   By: Elgie Collard M.D.   On: 12/21/2022 21:15   DG Chest Port 1 View  Result Date: 12/19/2022 CLINICAL DATA:  Shortness of breath, weakness EXAM: PORTABLE CHEST 1 VIEW COMPARISON:  12/16/2022 FINDINGS: Stable cardiomegaly. Slightly increased moderate right pleural effusion and associated airspace opacities. The left lung is clear. No pneumothorax. IMPRESSION: Increased moderate right pleural effusion and associated atelectasis or pneumonia. Electronically Signed   By: Minerva Fester M.D.   On: 12/19/2022 21:58   US Venous Img Lower Bilateral (DVT)  Result Date: 12/16/2022 CLINICAL DATA:  Swelling EXAM: BILATERAL LOWER EXTREMITY VENOUS DOPPLER ULTRASOUND TECHNIQUE: Gray-scale sonography with compression, as well as color and duplex ultrasound, were performed to evaluate the deep venous system(s) from the level of the common femoral vein through the popliteal and proximal calf veins. COMPARISON:  03/28/2019 FINDINGS: VENOUS Normal compressibility of the common femoral, superficial femoral, and popliteal  veins, as well as the visualized calf veins. Visualized portions of profunda femoral vein and great saphenous vein unremarkable. No filling defects to suggest DVT on grayscale or color Doppler imaging. Doppler waveforms show normal direction of venous flow, normal respiratory plasticity and response to augmentation. OTHER None. Limitations: none IMPRESSION: 1. No evidence of deep venous thrombosis within either lower extremity. Electronically Signed   By: Sharlet Salina M.D.   On:  12/16/2022 19:59   DG Chest Port 1 View  Result Date: 12/16/2022 CLINICAL DATA:  Pleural effusion EXAM: PORTABLE CHEST 1 VIEW COMPARISON:  12/15/2022 FINDINGS: The heart size and mediastinal contours are within normal limits. Slightly diminished, moderate, layering right pleural effusion and associated atelectasis or consolidation. No left pleural effusion. Left lung normally aerated. The visualized skeletal structures are unremarkable. IMPRESSION: Slightly diminished, moderate, layering right pleural effusion and associated atelectasis or consolidation. No left pleural effusion. Electronically Signed   By: Jearld Lesch M.D.   On: 12/16/2022 17:24   MR BRAIN W WO CONTRAST  Result Date: 12/15/2022 CLINICAL DATA:  Salivary ductal carcinoma. Assessment for metastatic disease. EXAM: MRI HEAD WITHOUT AND WITH CONTRAST TECHNIQUE: Multiplanar, multiecho pulse sequences of the brain and surrounding structures were obtained without and with intravenous contrast. CONTRAST:  10mL GADAVIST GADOBUTROL 1 MMOL/ML IV SOLN COMPARISON:  None Available. FINDINGS: Brain: No acute infarct, mass effect or extra-axial collection. No acute or chronic hemorrhage. There is multifocal hyperintense T2-weighted signal within the white matter. Parenchymal volume and CSF spaces are normal. Vascular: Abnormal left vertebral artery flow void. Skull and upper cervical spine: Normal calvarium and skull base. Visualized upper cervical spine and soft tissues are  normal. Sinuses/Orbits:Left mastoid effusion. Paranasal sinuses are clear. Ocular lens replacements. IMPRESSION: 1. No intracranial metastatic disease. 2. Abnormal left vertebral artery flow void, consistent with slow flow or occlusion. 3. Left mastoid effusion. Electronically Signed   By: Deatra Robinson M.D.   On: 12/15/2022 23:30   CT Soft Tissue Neck W Contrast  Result Date: 12/15/2022 CLINICAL DATA:  Soft tissue infection suspected, neck, xray done h/o L parotid cancer, change of voice, touble swallowing. EXAM: CT NECK WITH CONTRAST TECHNIQUE: Multidetector CT imaging of the neck was performed using the standard protocol following the bolus administration of intravenous contrast. RADIATION DOSE REDUCTION: This exam was performed according to the departmental dose-optimization program which includes automated exposure control, adjustment of the mA and/or kV according to patient size and/or use of iterative reconstruction technique. CONTRAST:  OMNIPAQUE IOHEXOL 350 MG/ML SOLN COMPARISON:  Neck CT 05/09/2018. FINDINGS: Pharynx and larynx: Normal. No mass or swelling.  Glottis is closed. Salivary glands: Prior left parotidectomy. Atrophy of the left submandibular gland, likely posttreatment. Normal right parotid and submandibular gland. Thyroid: Normal. Lymph nodes: Prior selective left neck dissection. No suspicious cervical lymphadenopathy. Vascular: Atherosclerotic calcifications of the carotid bulbs. Limited intracranial: Unremarkable. Visualized orbits: Unremarkable. Mastoids and visualized paranasal sinuses: Well aerated. Skeleton: Diffuse idiopathic skeletal hyperostosis with prominent anterior osteophytes at T2-3. Upper chest: Lobulated mass in the anterior right upper lobe segment with possible extrapleural extension and mediastinal invasion, measuring up to 4.1 x 3.6 cm (axial image 135 series 5), suspicious for malignancy. Moderate right pleural effusion. Other: None. IMPRESSION: 1. Lobulated  mass in the anterior right upper lobe segment with possible extrapleural extension and mediastinal invasion, measuring up to 4.1 cm, suspicious for malignancy. Dedicated chest CT is recommended for further evaluation. 2. Moderate right pleural effusion. 3. Prior left parotidectomy and selective left neck dissection. No suspicious cervical lymphadenopathy. Electronically Signed   By: Orvan Falconer M.D.   On: 12/15/2022 16:28   CT Head Wo Contrast  Result Date: 12/15/2022 CLINICAL DATA:  Mental status change, unknown cause. EXAM: CT HEAD WITHOUT CONTRAST TECHNIQUE: Contiguous axial images were obtained from the base of the skull through the vertex without intravenous contrast. RADIATION DOSE REDUCTION: This exam was performed according to the departmental dose-optimization program which includes  automated exposure control, adjustment of the mA and/or kV according to patient size and/or use of iterative reconstruction technique. COMPARISON:  None Available. 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 Gray-white differentiation is otherwise preserved. Mild patchy hypoattenuation of the periventricular white matter, most consistent with mild chronic small-vessel disease. No hydrocephalus or extra-axial collection. No mass effect or midline shift. Vascular: No hyperdense vessel or unexpected calcification. Skull: No calvarial fracture or suspicious bone lesion. Skull base is unremarkable. Sinuses/Orbits: No acute findings. Other: None. IMPRESSION: 1. 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 Consider MRI for further evaluation. 2. Mild chronic small-vessel disease. Electronically Signed   By: Orvan Falconer M.D.   On: 12/15/2022 16:16   CT Angio Chest Pulmonary Embolism (PE) W or WO Contrast  Result Date: 12/15/2022 CLINICAL DATA:  82 year old male with history of increasing shortness of breath. Evaluate for pulmonary embolism. EXAM: CT ANGIOGRAPHY  CHEST WITH CONTRAST TECHNIQUE: Multidetector CT imaging of the chest was performed using the standard protocol during bolus administration of intravenous contrast. Multiplanar CT image reconstructions and MIPs were obtained to evaluate the vascular anatomy. RADIATION DOSE REDUCTION: This exam was performed according to the departmental dose-optimization program which includes automated exposure control, adjustment of the mA and/or kV according to patient size and/or use of iterative reconstruction technique. CONTRAST:  OMNIPAQUE IOHEXOL 350 MG/ML SOLN COMPARISON:  No priors. FINDINGS: Cardiovascular: Study is slightly limited by patient respiratory motion and suboptimal contrast bolus. With these limitations in mind there is no definite central, lobar or segmental sized filling defect in the pulmonary arterial tree to indicate clinically significant pulmonary embolism. However, there does appear to be a subsegmental sized embolus to the left upper lobe (axial image 182 of series 6) which appears either occlusive or nearly completely occlusive. Heart size is normal. Small amount of pericardial fluid and/or thickening. No pericardial calcification. There is aortic atherosclerosis, as well as atherosclerosis of the great vessels of the mediastinum and the coronary arteries, including calcified atherosclerotic plaque in the left main, left anterior descending, left circumflex and right coronary arteries. Thickening and calcification of the aortic valve. Mediastinum/Nodes: No pathologically enlarged mediastinal or hilar lymph nodes. Esophagus is unremarkable in appearance. No axillary lymphadenopathy. Lungs/Pleura: Multiple nodular and mass-like areas of architectural distortion are noted in the anterior aspect of the right upper lobe, largest of which (axial image 87 of series 7) is estimated to measure approximately 4.4 x 3.6 cm and has macrolobulated and slightly spiculated margins, concerning for neoplasm.  Dependent areas of atelectasis are also noted in the right lung. Consolidative changes are also noted in the base of the right lower lobe. Left lung appears clear. Large right pleural effusion lying dependently. Upper Abdomen: Visualized portions of the liver have a shrunken appearance and nodular contour, suggesting underlying cirrhosis. Musculoskeletal: There are no aggressive appearing lytic or blastic lesions noted in the visualized portions of the skeleton. Review of the MIP images confirms the above findings. IMPRESSION: 1. Study is positive for a small subsegmental sized embolus to the left upper lobe, as above. 2. Importantly, however, there are nodular and mass-like regions in the right lung, most notable for a 4.4 x 3.6 cm mass in the anterior aspect of the right upper lobe concerning for potential neoplasm. Associated with this is a large right pleural effusion which may be malignant. Further clinical evaluation and consideration for follow-up PET-CT in the near future is strongly recommended to better evaluate these findings. 3. This right  pleural effusion is associated with considerable areas of atelectasis in the right lung, as well as some consolidative changes in the basal segments of the right lower lobe concerning for pneumonia. 4. Aortic atherosclerosis, in addition to left main and three-vessel coronary artery disease. Please note that although the presence of coronary artery calcium documents the presence of coronary artery disease, the severity of this disease and any potential stenosis cannot be assessed on this non-gated CT examination. Assessment for potential risk factor modification, dietary therapy or pharmacologic therapy may be warranted, if clinically indicated. 5. There are calcifications of the aortic valve. Echocardiographic correlation for evaluation of potential valvular dysfunction may be warranted if clinically indicated. Aortic Atherosclerosis (ICD10-I70.0). Electronically  Signed   By: Trudie Reed M.D.   On: 12/15/2022 16:01   DG Chest 2 View  Result Date: 12/15/2022 CLINICAL DATA:  Shortness of breath. EXAM: CHEST - 2 VIEW COMPARISON:  None Available. FINDINGS: The heart size appears enlarged. There is obscuration of the right cardiac silhouette. Moderate-sized right pleural effusion with associated basilar atelectasis. Bilateral central perihilar prominence. No pneumothorax. No acute osseous abnormality. IMPRESSION: 1. Moderate-sized right pleural effusion with associated basilar atelectasis. 2. Patchy right middle and lower lung zone opacities may represent an infectious/inflammatory etiology. Electronically Signed   By: Hart Robinsons M.D.   On: 12/15/2022 15:27    Microbiology: No results found for this or any previous visit (from the past 240 hour(s)).   Labs: Basic Metabolic Panel: Recent Labs  Lab 12/29/22 2127 12/30/22 0520 12/31/22 0435 01/01/23 0644  NA 126* 127* 127* 129*  K 4.6 4.7 5.1 5.3*  CL 90* 90* 94* 95*  CO2 25 24 21* 16*  GLUCOSE 132* 107* 111* 85  BUN 55* 57* 69* 85*  CREATININE 1.53* 1.41* 2.28* 3.41*  CALCIUM 8.9 8.8* 8.6* 8.4*   Liver Function Tests: Recent Labs  Lab 12/29/22 2127 12/30/22 0520 12/31/22 0435 01/01/23 0644  AST 135* 133* 319* 608*  ALT 71* 70* 90* 122*  ALKPHOS 320* 308* 308* 254*  BILITOT 1.8* 1.7* 2.3* 2.9*  PROT 6.4* 6.1* 6.4* 5.6*  ALBUMIN 3.2* 3.2* 3.3* 3.1*   No results for input(s): "LIPASE", "AMYLASE" in the last 168 hours. No results for input(s): "AMMONIA" in the last 168 hours. CBC: Recent Labs  Lab 12/29/22 2127 12/30/22 0520 12/31/22 0435 01/01/23 0644  WBC 8.3 8.2 9.2 10.4  NEUTROABS 6.9  --   --   --   HGB 13.5 14.0 13.9 14.0  HCT 40.6 42.2 42.1 42.1  MCV 85.3 85.6 85.6 85.9  PLT 272 278 282 280   Cardiac Enzymes: No results for input(s): "CKTOTAL", "CKMB", "CKMBINDEX", "TROPONINI" in the last 168 hours. BNP: BNP (last 3 results) Recent Labs    12/15/22 1425  12/31/22 0435  BNP 51.6 62.2    ProBNP (last 3 results) No results for input(s): "PROBNP" in the last 8760 hours.  CBG: Recent Labs  Lab 12/31/22 1640 12/31/22 2135 01/01/23 0839 01/01/23 1005 01/01/23 1204  GLUCAP 94 104* 73 100* 101*       Signed:  Silvano Bilis MD.  [facility] 01/02/2023, 1:17 PM

## 2023-01-02 NOTE — TOC Transition Note (Signed)
Transition of Care [facility]) - CM/SW Discharge Note   Patient Details  Name: Erik Hendricks MRN: [id number] Date of Birth: 1940/04/13  Transition of Care [facility]) CM/SW Contact:  Allena Katz, LCSW Phone Number: 01/02/2023, 1:23 PM   Clinical Narrative:   Pt has orders to discharge to [facility]. Medical necessity printed to unit.     Final next level of care:  [facility]) Barriers to Discharge: Barriers Resolved   Patient Goals and CMS Choice CMS Medicare.gov Compare Post Acute Care list provided to:: Patient Choice offered to / list presented to : Patient  Discharge Placement                  Patient to be transferred to facility by: [facility]   Patient and family notified of of transfer: 01/02/23  Discharge Plan and Services Additional resources added to the After Visit Summary for                                       Social Determinants of Health (SDOH) Interventions SDOH Screenings   Food Insecurity: No Food Insecurity (12/29/2022)  Housing: Low Risk  (12/29/2022)  Transportation Needs: No Transportation Needs (12/29/2022)  Utilities: Not At Risk (12/29/2022)  Depression (PHQ2-9): Low Risk  (12/29/2022)  Financial Resource Strain: Patient Declined (08/08/2022)   [facility] from [facility], [facility]  Tobacco Use: Medium Risk (12/29/2022)     Readmission Risk Interventions    12/30/2022    1:57 PM  Readmission Risk Prevention Plan  Transportation Screening Complete  PCP or Specialist Appt within 3-5 Days Complete  HRI or Home Care Consult Complete  Social Work Consult for Recovery Care Planning/Counseling Complete  Palliative Care Screening Not Applicable  Medication Review [employer]) Complete

## 2023-01-02 NOTE — Progress Notes (Signed)
Nutrition Brief Note  Chart reviewed. Pt now transitioning to comfort care.  No further nutrition interventions planned at this time.  Please re-consult as needed.   Levada Schilling, RD, LDN, CDCES Registered Dietitian III Certified Diabetes Care and Education Specialist Please refer to [facility] for RD and/or RD on-call/weekend/after hours pager

## 2023-01-02 NOTE — Plan of Care (Addendum)
PMT consult noted for goals of care. Pleurex cath placed. Patient to be discharged with [facility] following. Case discussed with attending. PMT will sign off as goals are set.

## 2023-01-03 ENCOUNTER — Encounter: Payer: Self-pay | Admitting: Pulmonary Disease

## 2023-01-12 ENCOUNTER — Other Ambulatory Visit: Payer: Medicare Other

## 2023-01-12 ENCOUNTER — Ambulatory Visit: Payer: Medicare Other | Admitting: Internal Medicine

## 2023-01-21 DEATH — deceased

## 2023-01-25 ENCOUNTER — Ambulatory Visit: Payer: Medicare Other | Admitting: Pulmonary Disease

## 2023-03-23 LAB — TRIGLYCERIDES, BODY FLUIDS

## 2023-03-23 LAB — CHOLESTEROL, BODY FLUID
